# Patient Record
Sex: Male | Born: 1935
Health system: Southern US, Community
[De-identification: ages and names within clinical notes are randomized; demographics above are authoritative.]

## PROBLEM LIST (undated history)

## (undated) DIAGNOSIS — I4510 Unspecified right bundle-branch block: Secondary | ICD-10-CM

## (undated) DIAGNOSIS — K219 Gastro-esophageal reflux disease without esophagitis: Secondary | ICD-10-CM

## (undated) DIAGNOSIS — I4891 Unspecified atrial fibrillation: Secondary | ICD-10-CM

## (undated) DIAGNOSIS — I2510 Atherosclerotic heart disease of native coronary artery without angina pectoris: Secondary | ICD-10-CM

## (undated) DIAGNOSIS — I10 Essential (primary) hypertension: Secondary | ICD-10-CM

## (undated) DIAGNOSIS — M109 Gout, unspecified: Secondary | ICD-10-CM

## (undated) DIAGNOSIS — M159 Polyosteoarthritis, unspecified: Secondary | ICD-10-CM

## (undated) DIAGNOSIS — Z8546 Personal history of malignant neoplasm of prostate: Secondary | ICD-10-CM

## (undated) DIAGNOSIS — G309 Alzheimer's disease, unspecified: Secondary | ICD-10-CM

## (undated) DIAGNOSIS — F0280 Dementia in other diseases classified elsewhere without behavioral disturbance: Secondary | ICD-10-CM

## (undated) HISTORY — DX: Unspecified right bundle-branch block: I45.10

## (undated) HISTORY — DX: Gastro-esophageal reflux disease without esophagitis: K21.9

## (undated) HISTORY — PX: ABDOMINAL ADHESION SURGERY: SHX90

## (undated) HISTORY — DX: Personal history of malignant neoplasm of prostate: Z85.46

## (undated) HISTORY — PX: KNEE SURGERY: SHX244

## (undated) HISTORY — DX: Dementia in other diseases classified elsewhere, unspecified severity, without behavioral disturbance, psychotic disturbance, mood disturbance, and anxiety: F02.80

## (undated) HISTORY — PX: APPENDECTOMY: SHX54

## (undated) HISTORY — DX: Atherosclerotic heart disease of native coronary artery without angina pectoris: I25.10

## (undated) HISTORY — DX: Polyosteoarthritis, unspecified: M15.9

## (undated) HISTORY — PX: INGUINAL HERNIA REPAIR: SUR1180

## (undated) HISTORY — PX: EP IMPLANTABLE DEVICE: SHX172B

## (undated) HISTORY — PX: SHOULDER ARTHROSCOPY: SHX128

## (undated) HISTORY — DX: Gout, unspecified: M10.9

## (undated) HISTORY — PX: TREATMENT FISTULA ANAL: SUR1390

## (undated) HISTORY — DX: Alzheimer's disease, unspecified: G30.9

## (undated) HISTORY — DX: Unspecified atrial fibrillation: I48.91

## (undated) HISTORY — PX: PROSTATECTOMY: SHX69

---

## 2012-12-15 DIAGNOSIS — I10 Essential (primary) hypertension: Secondary | ICD-10-CM | POA: Insufficient documentation

## 2012-12-15 DIAGNOSIS — I4510 Unspecified right bundle-branch block: Secondary | ICD-10-CM | POA: Insufficient documentation

## 2012-12-15 DIAGNOSIS — I4891 Unspecified atrial fibrillation: Secondary | ICD-10-CM | POA: Insufficient documentation

## 2013-01-22 LAB — PROTIME-INR: INR: 1.1 (ref 0.9–1.1)

## 2015-06-26 ENCOUNTER — Encounter: Payer: Self-pay | Admitting: Emergency Medicine

## 2015-06-26 ENCOUNTER — Emergency Department: Payer: Medicare Other

## 2015-06-26 ENCOUNTER — Emergency Department
Admission: EM | Admit: 2015-06-26 | Discharge: 2015-06-26 | Disposition: A | Discharge status: Home / Self Care | Payer: Medicare Other | Attending: Emergency Medicine | Admitting: Emergency Medicine

## 2015-06-26 DIAGNOSIS — M62830 Muscle spasm of back: Secondary | ICD-10-CM | POA: Insufficient documentation

## 2015-06-26 DIAGNOSIS — M5441 Lumbago with sciatica, right side: Secondary | ICD-10-CM | POA: Diagnosis not present

## 2015-06-26 DIAGNOSIS — Z79899 Other long term (current) drug therapy: Secondary | ICD-10-CM | POA: Insufficient documentation

## 2015-06-26 DIAGNOSIS — I10 Essential (primary) hypertension: Secondary | ICD-10-CM | POA: Diagnosis not present

## 2015-06-26 DIAGNOSIS — Z7982 Long term (current) use of aspirin: Secondary | ICD-10-CM | POA: Insufficient documentation

## 2015-06-26 DIAGNOSIS — M5136 Other intervertebral disc degeneration, lumbar region: Secondary | ICD-10-CM | POA: Diagnosis not present

## 2015-06-26 DIAGNOSIS — M5442 Lumbago with sciatica, left side: Secondary | ICD-10-CM | POA: Insufficient documentation

## 2015-06-26 DIAGNOSIS — M545 Low back pain: Secondary | ICD-10-CM | POA: Diagnosis present

## 2015-06-26 HISTORY — DX: Essential (primary) hypertension: I10

## 2015-06-26 MED ORDER — MELOXICAM 15 MG PO TABS: 15.0000 mg | ORAL_TABLET | Freq: Every day | ORAL | Status: DC

## 2015-06-26 MED ORDER — METHOCARBAMOL 500 MG PO TABS: 500.0000 mg | ORAL_TABLET | Freq: Four times a day (QID) | ORAL | Status: DC

## 2015-06-26 MED ORDER — OXYCODONE-ACETAMINOPHEN 10-325 MG PO TABS: 1.0000 | ORAL_TABLET | Freq: Four times a day (QID) | ORAL | Status: DC | PRN

## 2015-06-26 NOTE — ED Notes (Addendum)
Reports when breathing hard pain increases. Difficulty getting out of bed. TENS unit and heating pad applied.  Tramadol 50mg  taken around 10am.  Also used pain patch-Lidoderm patch on left upper back. Pain also in upper back. No changes in urination and having normal BMs. C/o tingling in bilateral hands. Denies numbness to LE.

## 2015-06-26 NOTE — ED Provider Notes (Signed)
[facility] Emergency Department Provider Note  ____________________________________________  Time seen: Approximately 12:21 PM  I have reviewed the triage vital signs and the nursing notes.   HISTORY  Chief Complaint Back Pain and Numbness    HPI Austin [name] is a 81 y.o. male who presents to emergency department complaining of upper and lower back pain. He states that symptoms began last night. He describes the pain as a spasming sensation. He does endorse some intermittent numbness and tingling in extremities. He denies any bowel or bladder dysfunction. Denies any recent history of injury. Patient has tried a TENS unit, heating pad, tramadol, and Lidoderm patch for symptom relief. He denies any improvement on those medications.   Past Medical History  Diagnosis Date  . MI (myocardial infarction) ([place])   . Neck pain   . Back pain   . HTN (hypertension)   . Prostate cancer ([place])   . Appendicitis     There are no active problems to display for this patient.   Past Surgical History  Procedure Laterality Date  . Appendectomy    . Abdominal adhesion surgery    . Shoulder arthroscopy    . Prostatectomy      Current Outpatient Rx  Name  Route  Sig  Dispense  Refill  . aspirin 81 MG tablet   Oral   Take 81 mg by mouth daily.         . Cholecalciferol (VITAMIN D) 2000 UNITS tablet   Oral   Take 2,000 Units by mouth daily.         . clorazepate (TRANXENE) 3.75 MG tablet   Oral   Take 3.75 mg by mouth 2 (two) times daily as needed for anxiety.         Marland Kitchen telmisartan (MICARDIS) 40 MG tablet   Oral   Take 40 mg by mouth 2 (two) times daily at 10 AM and 5 PM.         . meloxicam (MOBIC) 15 MG tablet   Oral   Take 1 tablet (15 mg total) by mouth daily.   30 tablet   0   . methocarbamol (ROBAXIN) 500 MG tablet   Oral   Take 1 tablet (500 mg total) by mouth 4 (four) times daily.   16 tablet   0   . oxyCODONE-acetaminophen  (PERCOCET) 10-325 MG tablet   Oral   Take 1 tablet by mouth every 6 (six) hours as needed for pain.   20 tablet   0     Allergies Review of patient's allergies indicates no known allergies.  History reviewed. No pertinent family history.  Social History Social History  Substance Use Topics  . Smoking status: Never Smoker   . Smokeless tobacco: Never Used  . Alcohol Use: Yes     Comment: 1 martini/ day    Review of Systems Constitutional: No fever/chills Eyes: No visual changes. ENT: No sore throat. Cardiovascular: Denies chest pain. Respiratory: Denies shortness of breath. Gastrointestinal: No abdominal pain.  No nausea, no vomiting.  No diarrhea.  No constipation. Genitourinary: Negative for dysuria. Musculoskeletal: Endorses upper and lower back pain. Skin: Negative for rash. Neurological: Negative for headaches, focal weakness or numbness.  10-point ROS otherwise negative.  ____________________________________________   PHYSICAL EXAM:  VITAL SIGNS: ED Triage Vitals  Enc Vitals Group     BP 06/26/15 1159 160/87 mmHg     Pulse Rate 06/26/15 1159 59     Resp 06/26/15 1159 18  Temp 06/26/15 1159 97.9 F (36.6 C)     Temp src --      SpO2 06/26/15 1159 100 %     Weight 06/26/15 1159 144 lb (65.318 kg)     Height 06/26/15 1159 5\' 8"  (1.727 m)     Head Cir --      Peak Flow --      Pain Score 06/26/15 1201 9     Pain Loc --      Pain Edu? --      Excl. in [place]? --     Constitutional: Alert and oriented. Well appearing and in no acute distress. Eyes: Conjunctivae are normal. PERRL. EOMI. Head: Atraumatic. Nose: No congestion/rhinnorhea. Mouth/Throat: Mucous membranes are moist.  Oropharynx non-erythematous. Neck: No stridor.  No cervical spine tenderness to palpation. Cardiovascular: Normal rate, regular rhythm. Grossly normal heart sounds.  Good peripheral circulation. Respiratory: Normal respiratory effort.  No retractions. Lungs  CTAB. Gastrointestinal: Soft and nontender. No distention. No abdominal bruits. No CVA tenderness. No palpable masses. Musculoskeletal: No lower extremity tenderness nor edema.  No joint effusions. No deformity to back. Inspection. Patient is tender to palpation midline spinal processes in the lumbar region. No palpable abnormality. Patient is also diffusely tender to palpation in the lower cervical paraspinal muscle groups bilaterally. Patient also tender to palpation bilateral paraspinal muscle groups in the lumbar region. Pulses and sensation are intact all 4 extremities. Neurologic:  Normal speech and language. No gross focal neurologic deficits are appreciated. No gait instability. Cranial nerves II through XII are grossly intact. Skin:  Skin is warm, dry and intact. No rash noted. Psychiatric: Mood and affect are normal. Speech and behavior are normal.  ____________________________________________   LABS (all labs ordered are listed, but only abnormal results are displayed)  Labs Reviewed - No data to display ____________________________________________  EKG   ____________________________________________  RADIOLOGY  Lumbar spine x-ray Impression: No acute fracture or dislocation. Severe degenerative joint changes of lumbar spine.   The images were personally reviewed by myself. ____________________________________________   PROCEDURES  Procedure(s) performed: None  Critical Care performed: No  ____________________________________________   INITIAL IMPRESSION / ASSESSMENT AND PLAN / ED COURSE  Pertinent labs & imaging results that were available during my care of the patient were reviewed by me and considered in my medical decision making (see chart for details).  Patient's diagnosis is consistent with degenerative disc disease of lumbar spine with lumbago and intermittent sciatica. Advised patient of findings and diagnosis and he verbalizes understanding of same.  Patient was placed on anti-inflammatories, muscle relaxers, and pain medication for symptom control. Patient is advised of findings and diagnosis and he verbalizes understanding of same.    New Prescriptions   MELOXICAM (MOBIC) 15 MG TABLET    Take 1 tablet (15 mg total) by mouth daily.   METHOCARBAMOL (ROBAXIN) 500 MG TABLET    Take 1 tablet (500 mg total) by mouth 4 (four) times daily.   OXYCODONE-ACETAMINOPHEN (PERCOCET) 10-325 MG TABLET    Take 1 tablet by mouth every 6 (six) hours as needed for pain.    ____________________________________________   FINAL CLINICAL IMPRESSION(S) / ED DIAGNOSES  Final diagnoses:  Degenerative disc disease, lumbar  Lumbar paraspinal muscle spasm  Acute midline low back pain with bilateral sciatica      Darletta Moll, PA-C 06/26/15 1326  Lavonia Drafts, MD 06/26/15 1409

## 2015-06-26 NOTE — Discharge Instructions (Signed)
Degenerative Disk Disease  Degenerative disk disease is a condition caused by the changes that occur in spinal disks as you grow older. Spinal disks are soft and compressible disks located between the bones of your spine (vertebrae). These disks act like shock absorbers. Degenerative disk disease can affect the whole spine. However, the neck and lower back are most commonly affected. Many changes can occur in the spinal disks with aging, such as:  · The spinal disks may dry and shrink.  · Small tears may occur in the tough, outer covering of the disk (annulus).  · The disk space may become smaller due to loss of water.  · Abnormal growths in the bone (spurs) may occur. This can put pressure on the nerve roots exiting the spinal canal, causing pain.  · The spinal canal may become narrowed.  RISK FACTORS   · Being overweight.  · Having a family history of degenerative disk disease.  · Smoking.  · There is increased risk if you are doing heavy lifting or have a sudden injury.  SIGNS AND SYMPTOMS   Symptoms vary from person to person and may include:  · Pain that varies in intensity. Some people have no pain, while others have severe pain. The location of the pain depends on the part of your backbone that is affected.  ¨ You will have neck or arm pain if a disk in the neck area is affected.  ¨ You will have pain in your back, buttocks, or legs if a disk in the lower back is affected.  · Pain that becomes worse while bending, reaching up, or with twisting movements.  · Pain that may start gradually and then get worse as time passes. It may also start after a major or minor injury.  · Numbness or tingling in the arms or legs.  DIAGNOSIS   Your health care provider will ask you about your symptoms and about activities or habits that may cause the pain. He or she may also ask about any injuries, diseases, or treatments you have had. Your health care provider will examine you to check for the range of movement that is  possible in the affected area, to check for strength in your extremities, and to check for sensation in the areas of the arms and legs supplied by different nerve roots. You may also have:   · An X-ray of the spine.  · Other imaging tests, such as MRI.  TREATMENT   Your health care provider will advise you on the best plan for treatment. Treatment may include:  · Medicines.  · Rehabilitation exercises.  HOME CARE INSTRUCTIONS   · Follow proper lifting and walking techniques as advised by your health care provider.  · Maintain good posture.  · Exercise regularly as advised by your health care provider.  · Perform relaxation exercises.  · Change your sitting, standing, and sleeping habits as advised by your health care provider.  · Change positions frequently.  · Lose weight or maintain a healthy weight as advised by your health care provider.  · Do not use any tobacco products, including cigarettes, chewing tobacco, or electronic cigarettes. If you need help quitting, ask your health care provider.  · Wear supportive footwear.  · Take medicines only as directed by your health care provider.  SEEK MEDICAL CARE IF:   · Your pain does not go away within 1-4 weeks.  · You have significant appetite or weight loss.  SEEK IMMEDIATE MEDICAL CARE IF:   ·   instructions.  Will watch your condition.  Will get help right away if you are not doing well or get worse.   This information is not intended to replace advice given to you by your health care provider. Make sure you discuss any questions you have with your health care provider.   Document Released: 04/15/2007 Document Revised: 07/09/2014 Document Reviewed: 10/20/2013 Elsevier Interactive Patient Education 2016 Elsevier Inc[employer]  Radicular Pain Radicular  pain in either the arm or leg is usually from a bulging or herniated disk in the spine. A piece of the herniated disk may press against the nerves as the nerves exit the spine. This causes pain which is felt at the tips of the nerves down the arm or leg. Other causes of radicular pain may include:  Fractures.  Heart disease.  Cancer.  An abnormal and usually degenerative state of the nervous system or nerves (neuropathy). Diagnosis may require CT or MRI scanning to determine the primary cause.  Nerves that start at the neck (nerve roots) may cause radicular pain in the outer shoulder and arm. It can spread down to the thumb and fingers. The symptoms vary depending on which nerve root has been affected. In most cases radicular pain improves with conservative treatment. Neck problems may require physical therapy, a neck collar, or cervical traction. Treatment may take many weeks, and surgery may be considered if the symptoms do not improve.  Conservative treatment is also recommended for sciatica. Sciatica causes pain to radiate from the lower back or buttock area down the leg into the foot. Often there is a history of back problems. Most patients with sciatica are better after 2 to 4 weeks of rest and other supportive care. Short term bed rest can reduce the disk pressure considerably. Sitting, however, is not a good position since this increases the pressure on the disk. You should avoid bending, lifting, and all other activities which make the problem worse. Traction can be used in severe cases. Surgery is usually reserved for patients who do not improve within the first months of treatment. Only take over-the-counter or prescription medicines for pain, discomfort, or fever as directed by your caregiver. Narcotics and muscle relaxants may help by relieving more severe pain and spasm and by providing mild sedation. Cold or massage can give significant relief. Spinal manipulation is not recommended. It  can increase the degree of disc protrusion. Epidural steroid injections are often effective treatment for radicular pain. These injections deliver medicine to the spinal nerve in the space between the protective covering of the spinal cord and back bones (vertebrae). Your caregiver can give you more information about steroid injections. These injections are most effective when given within two weeks of the onset of pain.  You should see your caregiver for follow up care as recommended. A program for neck and back injury rehabilitation with stretching and strengthening exercises is an important part of management.  SEEK IMMEDIATE MEDICAL CARE IF:  You develop increased pain, weakness, or numbness in your arm or leg.  You develop difficulty with bladder or bowel control.  You develop abdominal pain.   This information is not intended to replace advice given to you by your health care provider. Make sure you discuss any questions you have with your health care provider.   Document Released: 07/26/2004 Document Revised: 07/09/2014 Document Reviewed: 01/12/2015 Elsevier Interactive Patient Education [employer].

## 2015-06-26 NOTE — ED Notes (Addendum)
Pt c/o lower back pain that started yesterday. Pt states that he has used a TENS unit, pain patches, and a tramadol with no help for the pain. Pt presents alert and oriented at this time. Pt states he is unable to walk due to the pain. Pt also c/o R hand numbness, states that has resolved at this time with the exception of 1 finger on his R hand. Pt is neurologically intact at this time. Pt denies hx of kidney stones at this time. Neuro exam intact at this time, bilateral equal grip strength, no drift noted at this time, face is symmetrical.

## 2015-07-08 DIAGNOSIS — M47812 Spondylosis without myelopathy or radiculopathy, cervical region: Secondary | ICD-10-CM | POA: Diagnosis not present

## 2015-07-15 DIAGNOSIS — M5412 Radiculopathy, cervical region: Secondary | ICD-10-CM | POA: Diagnosis not present

## 2015-07-15 DIAGNOSIS — M5416 Radiculopathy, lumbar region: Secondary | ICD-10-CM | POA: Diagnosis not present

## 2015-07-15 DIAGNOSIS — I10 Essential (primary) hypertension: Secondary | ICD-10-CM | POA: Diagnosis not present

## 2015-08-04 DIAGNOSIS — R0789 Other chest pain: Secondary | ICD-10-CM | POA: Diagnosis not present

## 2015-08-04 DIAGNOSIS — I4510 Unspecified right bundle-branch block: Secondary | ICD-10-CM | POA: Diagnosis not present

## 2015-08-04 DIAGNOSIS — R002 Palpitations: Secondary | ICD-10-CM | POA: Diagnosis not present

## 2015-08-04 DIAGNOSIS — F0390 Unspecified dementia without behavioral disturbance: Secondary | ICD-10-CM | POA: Diagnosis not present

## 2015-08-04 DIAGNOSIS — I712 Thoracic aortic aneurysm, without rupture: Secondary | ICD-10-CM | POA: Diagnosis not present

## 2015-08-04 DIAGNOSIS — R079 Chest pain, unspecified: Secondary | ICD-10-CM | POA: Diagnosis not present

## 2015-08-04 DIAGNOSIS — R0781 Pleurodynia: Secondary | ICD-10-CM | POA: Diagnosis not present

## 2015-08-04 DIAGNOSIS — I252 Old myocardial infarction: Secondary | ICD-10-CM | POA: Diagnosis not present

## 2015-08-04 DIAGNOSIS — K219 Gastro-esophageal reflux disease without esophagitis: Secondary | ICD-10-CM | POA: Diagnosis not present

## 2015-08-04 DIAGNOSIS — I10 Essential (primary) hypertension: Secondary | ICD-10-CM | POA: Diagnosis not present

## 2015-08-04 DIAGNOSIS — J9811 Atelectasis: Secondary | ICD-10-CM | POA: Diagnosis not present

## 2015-08-04 DIAGNOSIS — R5381 Other malaise: Secondary | ICD-10-CM | POA: Diagnosis not present

## 2015-08-04 DIAGNOSIS — R5383 Other fatigue: Secondary | ICD-10-CM | POA: Diagnosis not present

## 2015-08-05 DIAGNOSIS — K219 Gastro-esophageal reflux disease without esophagitis: Secondary | ICD-10-CM | POA: Diagnosis not present

## 2015-08-05 DIAGNOSIS — I10 Essential (primary) hypertension: Secondary | ICD-10-CM | POA: Diagnosis not present

## 2015-08-05 DIAGNOSIS — R0789 Other chest pain: Secondary | ICD-10-CM | POA: Insufficient documentation

## 2015-08-05 DIAGNOSIS — R079 Chest pain, unspecified: Secondary | ICD-10-CM | POA: Diagnosis not present

## 2015-08-05 DIAGNOSIS — I480 Paroxysmal atrial fibrillation: Secondary | ICD-10-CM | POA: Diagnosis not present

## 2015-08-05 DIAGNOSIS — I495 Sick sinus syndrome: Secondary | ICD-10-CM | POA: Diagnosis not present

## 2015-08-05 DIAGNOSIS — R0781 Pleurodynia: Secondary | ICD-10-CM | POA: Diagnosis not present

## 2015-08-12 DIAGNOSIS — R4181 Age-related cognitive decline: Secondary | ICD-10-CM | POA: Diagnosis not present

## 2015-08-12 DIAGNOSIS — F0390 Unspecified dementia without behavioral disturbance: Secondary | ICD-10-CM | POA: Diagnosis not present

## 2015-08-12 DIAGNOSIS — I4891 Unspecified atrial fibrillation: Secondary | ICD-10-CM | POA: Diagnosis not present

## 2015-08-12 DIAGNOSIS — R0789 Other chest pain: Secondary | ICD-10-CM | POA: Diagnosis not present

## 2015-08-18 DIAGNOSIS — Z7982 Long term (current) use of aspirin: Secondary | ICD-10-CM | POA: Diagnosis not present

## 2015-08-18 DIAGNOSIS — I4891 Unspecified atrial fibrillation: Secondary | ICD-10-CM | POA: Diagnosis not present

## 2015-08-18 DIAGNOSIS — R0789 Other chest pain: Secondary | ICD-10-CM | POA: Diagnosis not present

## 2015-08-18 DIAGNOSIS — R05 Cough: Secondary | ICD-10-CM | POA: Diagnosis not present

## 2015-08-18 DIAGNOSIS — I480 Paroxysmal atrial fibrillation: Secondary | ICD-10-CM | POA: Diagnosis present

## 2015-08-18 DIAGNOSIS — J40 Bronchitis, not specified as acute or chronic: Secondary | ICD-10-CM | POA: Diagnosis present

## 2015-08-18 DIAGNOSIS — K219 Gastro-esophageal reflux disease without esophagitis: Secondary | ICD-10-CM | POA: Diagnosis not present

## 2015-08-18 DIAGNOSIS — R079 Chest pain, unspecified: Secondary | ICD-10-CM | POA: Diagnosis not present

## 2015-08-18 DIAGNOSIS — I495 Sick sinus syndrome: Secondary | ICD-10-CM | POA: Diagnosis not present

## 2015-08-18 DIAGNOSIS — I4510 Unspecified right bundle-branch block: Secondary | ICD-10-CM | POA: Diagnosis present

## 2015-08-18 DIAGNOSIS — M1990 Unspecified osteoarthritis, unspecified site: Secondary | ICD-10-CM | POA: Diagnosis present

## 2015-08-18 DIAGNOSIS — Z8546 Personal history of malignant neoplasm of prostate: Secondary | ICD-10-CM | POA: Diagnosis not present

## 2015-08-18 DIAGNOSIS — I10 Essential (primary) hypertension: Secondary | ICD-10-CM | POA: Diagnosis present

## 2015-08-18 DIAGNOSIS — F0390 Unspecified dementia without behavioral disturbance: Secondary | ICD-10-CM | POA: Diagnosis not present

## 2015-08-19 DIAGNOSIS — I480 Paroxysmal atrial fibrillation: Secondary | ICD-10-CM | POA: Diagnosis not present

## 2015-08-29 DIAGNOSIS — F0390 Unspecified dementia without behavioral disturbance: Secondary | ICD-10-CM | POA: Diagnosis not present

## 2015-08-29 DIAGNOSIS — Z95818 Presence of other cardiac implants and grafts: Secondary | ICD-10-CM | POA: Diagnosis not present

## 2015-08-29 DIAGNOSIS — I4510 Unspecified right bundle-branch block: Secondary | ICD-10-CM | POA: Diagnosis not present

## 2015-08-29 DIAGNOSIS — I480 Paroxysmal atrial fibrillation: Secondary | ICD-10-CM | POA: Diagnosis not present

## 2015-08-29 DIAGNOSIS — I10 Essential (primary) hypertension: Secondary | ICD-10-CM | POA: Diagnosis not present

## 2015-09-02 DIAGNOSIS — Z8673 Personal history of transient ischemic attack (TIA), and cerebral infarction without residual deficits: Secondary | ICD-10-CM | POA: Diagnosis not present

## 2015-09-02 DIAGNOSIS — R4189 Other symptoms and signs involving cognitive functions and awareness: Secondary | ICD-10-CM | POA: Diagnosis not present

## 2015-09-02 DIAGNOSIS — M542 Cervicalgia: Secondary | ICD-10-CM | POA: Diagnosis not present

## 2015-09-12 DIAGNOSIS — Z79899 Other long term (current) drug therapy: Secondary | ICD-10-CM | POA: Diagnosis not present

## 2015-09-16 DIAGNOSIS — G253 Myoclonus: Secondary | ICD-10-CM | POA: Diagnosis not present

## 2015-09-16 DIAGNOSIS — F0390 Unspecified dementia without behavioral disturbance: Secondary | ICD-10-CM | POA: Diagnosis not present

## 2015-09-16 DIAGNOSIS — I480 Paroxysmal atrial fibrillation: Secondary | ICD-10-CM | POA: Diagnosis not present

## 2015-09-16 DIAGNOSIS — I4891 Unspecified atrial fibrillation: Secondary | ICD-10-CM | POA: Diagnosis not present

## 2015-09-16 DIAGNOSIS — M6281 Muscle weakness (generalized): Secondary | ICD-10-CM | POA: Diagnosis not present

## 2015-09-16 DIAGNOSIS — I10 Essential (primary) hypertension: Secondary | ICD-10-CM | POA: Diagnosis not present

## 2015-09-16 DIAGNOSIS — R531 Weakness: Secondary | ICD-10-CM | POA: Diagnosis not present

## 2015-09-16 DIAGNOSIS — S79912A Unspecified injury of left hip, initial encounter: Secondary | ICD-10-CM | POA: Diagnosis not present

## 2015-09-16 DIAGNOSIS — M25562 Pain in left knee: Secondary | ICD-10-CM | POA: Diagnosis not present

## 2015-09-16 DIAGNOSIS — R2681 Unsteadiness on feet: Secondary | ICD-10-CM | POA: Diagnosis not present

## 2015-09-16 DIAGNOSIS — M79606 Pain in leg, unspecified: Secondary | ICD-10-CM | POA: Diagnosis not present

## 2015-09-16 DIAGNOSIS — R4182 Altered mental status, unspecified: Secondary | ICD-10-CM | POA: Diagnosis not present

## 2015-09-16 DIAGNOSIS — R410 Disorientation, unspecified: Secondary | ICD-10-CM | POA: Diagnosis not present

## 2015-09-16 DIAGNOSIS — Z7982 Long term (current) use of aspirin: Secondary | ICD-10-CM | POA: Diagnosis not present

## 2015-09-16 DIAGNOSIS — R0602 Shortness of breath: Secondary | ICD-10-CM | POA: Diagnosis not present

## 2015-09-17 DIAGNOSIS — M6281 Muscle weakness (generalized): Secondary | ICD-10-CM | POA: Diagnosis not present

## 2015-09-17 DIAGNOSIS — I480 Paroxysmal atrial fibrillation: Secondary | ICD-10-CM | POA: Diagnosis not present

## 2015-09-17 DIAGNOSIS — R410 Disorientation, unspecified: Secondary | ICD-10-CM | POA: Diagnosis not present

## 2015-09-17 DIAGNOSIS — F0390 Unspecified dementia without behavioral disturbance: Secondary | ICD-10-CM | POA: Diagnosis not present

## 2015-09-17 DIAGNOSIS — I6781 Acute cerebrovascular insufficiency: Secondary | ICD-10-CM | POA: Diagnosis not present

## 2015-09-18 DIAGNOSIS — M6281 Muscle weakness (generalized): Secondary | ICD-10-CM | POA: Diagnosis not present

## 2015-09-18 DIAGNOSIS — F0390 Unspecified dementia without behavioral disturbance: Secondary | ICD-10-CM | POA: Diagnosis not present

## 2015-09-18 DIAGNOSIS — R0602 Shortness of breath: Secondary | ICD-10-CM | POA: Diagnosis not present

## 2015-09-18 DIAGNOSIS — I480 Paroxysmal atrial fibrillation: Secondary | ICD-10-CM | POA: Diagnosis not present

## 2015-09-19 DIAGNOSIS — G253 Myoclonus: Secondary | ICD-10-CM | POA: Diagnosis not present

## 2015-09-19 DIAGNOSIS — F0390 Unspecified dementia without behavioral disturbance: Secondary | ICD-10-CM | POA: Diagnosis not present

## 2015-09-19 DIAGNOSIS — M6281 Muscle weakness (generalized): Secondary | ICD-10-CM | POA: Diagnosis not present

## 2015-09-19 DIAGNOSIS — I480 Paroxysmal atrial fibrillation: Secondary | ICD-10-CM | POA: Diagnosis not present

## 2015-09-20 DIAGNOSIS — M6281 Muscle weakness (generalized): Secondary | ICD-10-CM | POA: Diagnosis not present

## 2015-09-20 DIAGNOSIS — G253 Myoclonus: Secondary | ICD-10-CM | POA: Diagnosis not present

## 2015-09-21 DIAGNOSIS — F0390 Unspecified dementia without behavioral disturbance: Secondary | ICD-10-CM | POA: Diagnosis not present

## 2015-09-24 DIAGNOSIS — F0390 Unspecified dementia without behavioral disturbance: Secondary | ICD-10-CM | POA: Diagnosis not present

## 2015-09-24 DIAGNOSIS — I10 Essential (primary) hypertension: Secondary | ICD-10-CM | POA: Diagnosis not present

## 2015-09-24 DIAGNOSIS — G253 Myoclonus: Secondary | ICD-10-CM | POA: Diagnosis not present

## 2015-09-24 DIAGNOSIS — I480 Paroxysmal atrial fibrillation: Secondary | ICD-10-CM | POA: Diagnosis not present

## 2015-09-24 DIAGNOSIS — M6281 Muscle weakness (generalized): Secondary | ICD-10-CM | POA: Diagnosis not present

## 2015-09-24 DIAGNOSIS — R296 Repeated falls: Secondary | ICD-10-CM | POA: Diagnosis not present

## 2015-09-27 DIAGNOSIS — R4181 Age-related cognitive decline: Secondary | ICD-10-CM | POA: Diagnosis not present

## 2015-09-27 DIAGNOSIS — R296 Repeated falls: Secondary | ICD-10-CM | POA: Diagnosis not present

## 2015-09-28 DIAGNOSIS — I10 Essential (primary) hypertension: Secondary | ICD-10-CM | POA: Diagnosis not present

## 2015-09-28 DIAGNOSIS — F0390 Unspecified dementia without behavioral disturbance: Secondary | ICD-10-CM | POA: Diagnosis not present

## 2015-09-28 DIAGNOSIS — M6281 Muscle weakness (generalized): Secondary | ICD-10-CM | POA: Diagnosis not present

## 2015-09-28 DIAGNOSIS — I480 Paroxysmal atrial fibrillation: Secondary | ICD-10-CM | POA: Diagnosis not present

## 2015-09-28 DIAGNOSIS — G253 Myoclonus: Secondary | ICD-10-CM | POA: Diagnosis not present

## 2015-09-28 DIAGNOSIS — R296 Repeated falls: Secondary | ICD-10-CM | POA: Diagnosis not present

## 2015-09-29 DIAGNOSIS — I10 Essential (primary) hypertension: Secondary | ICD-10-CM | POA: Diagnosis not present

## 2015-09-29 DIAGNOSIS — R296 Repeated falls: Secondary | ICD-10-CM | POA: Diagnosis not present

## 2015-09-29 DIAGNOSIS — I480 Paroxysmal atrial fibrillation: Secondary | ICD-10-CM | POA: Diagnosis not present

## 2015-09-29 DIAGNOSIS — F0390 Unspecified dementia without behavioral disturbance: Secondary | ICD-10-CM | POA: Diagnosis not present

## 2015-09-29 DIAGNOSIS — M6281 Muscle weakness (generalized): Secondary | ICD-10-CM | POA: Diagnosis not present

## 2015-09-29 DIAGNOSIS — G253 Myoclonus: Secondary | ICD-10-CM | POA: Diagnosis not present

## 2015-09-30 DIAGNOSIS — R296 Repeated falls: Secondary | ICD-10-CM | POA: Diagnosis not present

## 2015-09-30 DIAGNOSIS — G253 Myoclonus: Secondary | ICD-10-CM | POA: Diagnosis not present

## 2015-09-30 DIAGNOSIS — I10 Essential (primary) hypertension: Secondary | ICD-10-CM | POA: Diagnosis not present

## 2015-09-30 DIAGNOSIS — I480 Paroxysmal atrial fibrillation: Secondary | ICD-10-CM | POA: Diagnosis not present

## 2015-09-30 DIAGNOSIS — F0390 Unspecified dementia without behavioral disturbance: Secondary | ICD-10-CM | POA: Diagnosis not present

## 2015-09-30 DIAGNOSIS — M6281 Muscle weakness (generalized): Secondary | ICD-10-CM | POA: Diagnosis not present

## 2015-10-01 DIAGNOSIS — F0390 Unspecified dementia without behavioral disturbance: Secondary | ICD-10-CM | POA: Diagnosis not present

## 2015-10-01 DIAGNOSIS — R296 Repeated falls: Secondary | ICD-10-CM | POA: Diagnosis not present

## 2015-10-01 DIAGNOSIS — G253 Myoclonus: Secondary | ICD-10-CM | POA: Diagnosis not present

## 2015-10-01 DIAGNOSIS — M6281 Muscle weakness (generalized): Secondary | ICD-10-CM | POA: Diagnosis not present

## 2015-10-01 DIAGNOSIS — I10 Essential (primary) hypertension: Secondary | ICD-10-CM | POA: Diagnosis not present

## 2015-10-01 DIAGNOSIS — I480 Paroxysmal atrial fibrillation: Secondary | ICD-10-CM | POA: Diagnosis not present

## 2015-10-02 DIAGNOSIS — M6281 Muscle weakness (generalized): Secondary | ICD-10-CM | POA: Diagnosis not present

## 2015-10-02 DIAGNOSIS — I480 Paroxysmal atrial fibrillation: Secondary | ICD-10-CM | POA: Diagnosis not present

## 2015-10-03 DIAGNOSIS — M6281 Muscle weakness (generalized): Secondary | ICD-10-CM | POA: Diagnosis not present

## 2015-10-03 DIAGNOSIS — I10 Essential (primary) hypertension: Secondary | ICD-10-CM | POA: Diagnosis not present

## 2015-10-03 DIAGNOSIS — R296 Repeated falls: Secondary | ICD-10-CM | POA: Diagnosis not present

## 2015-10-03 DIAGNOSIS — I480 Paroxysmal atrial fibrillation: Secondary | ICD-10-CM | POA: Diagnosis not present

## 2015-10-03 DIAGNOSIS — F0390 Unspecified dementia without behavioral disturbance: Secondary | ICD-10-CM | POA: Diagnosis not present

## 2015-10-03 DIAGNOSIS — G253 Myoclonus: Secondary | ICD-10-CM | POA: Diagnosis not present

## 2015-10-04 DIAGNOSIS — I10 Essential (primary) hypertension: Secondary | ICD-10-CM | POA: Diagnosis not present

## 2015-10-04 DIAGNOSIS — F0390 Unspecified dementia without behavioral disturbance: Secondary | ICD-10-CM | POA: Diagnosis not present

## 2015-10-04 DIAGNOSIS — I480 Paroxysmal atrial fibrillation: Secondary | ICD-10-CM | POA: Diagnosis not present

## 2015-10-04 DIAGNOSIS — M6281 Muscle weakness (generalized): Secondary | ICD-10-CM | POA: Diagnosis not present

## 2015-10-04 DIAGNOSIS — G253 Myoclonus: Secondary | ICD-10-CM | POA: Diagnosis not present

## 2015-10-04 DIAGNOSIS — R296 Repeated falls: Secondary | ICD-10-CM | POA: Diagnosis not present

## 2015-10-05 DIAGNOSIS — G253 Myoclonus: Secondary | ICD-10-CM | POA: Diagnosis not present

## 2015-10-05 DIAGNOSIS — M6281 Muscle weakness (generalized): Secondary | ICD-10-CM | POA: Diagnosis not present

## 2015-10-05 DIAGNOSIS — F0390 Unspecified dementia without behavioral disturbance: Secondary | ICD-10-CM | POA: Diagnosis not present

## 2015-10-05 DIAGNOSIS — I10 Essential (primary) hypertension: Secondary | ICD-10-CM | POA: Diagnosis not present

## 2015-10-05 DIAGNOSIS — I480 Paroxysmal atrial fibrillation: Secondary | ICD-10-CM | POA: Diagnosis not present

## 2015-10-05 DIAGNOSIS — R296 Repeated falls: Secondary | ICD-10-CM | POA: Diagnosis not present

## 2015-10-06 DIAGNOSIS — M6281 Muscle weakness (generalized): Secondary | ICD-10-CM | POA: Diagnosis not present

## 2015-10-06 DIAGNOSIS — F0390 Unspecified dementia without behavioral disturbance: Secondary | ICD-10-CM | POA: Diagnosis not present

## 2015-10-06 DIAGNOSIS — I480 Paroxysmal atrial fibrillation: Secondary | ICD-10-CM | POA: Diagnosis not present

## 2015-10-06 DIAGNOSIS — R296 Repeated falls: Secondary | ICD-10-CM | POA: Diagnosis not present

## 2015-10-06 DIAGNOSIS — G253 Myoclonus: Secondary | ICD-10-CM | POA: Diagnosis not present

## 2015-10-06 DIAGNOSIS — I10 Essential (primary) hypertension: Secondary | ICD-10-CM | POA: Diagnosis not present

## 2015-10-07 DIAGNOSIS — G253 Myoclonus: Secondary | ICD-10-CM | POA: Diagnosis not present

## 2015-10-07 DIAGNOSIS — R296 Repeated falls: Secondary | ICD-10-CM | POA: Diagnosis not present

## 2015-10-07 DIAGNOSIS — F0390 Unspecified dementia without behavioral disturbance: Secondary | ICD-10-CM | POA: Diagnosis not present

## 2015-10-07 DIAGNOSIS — I480 Paroxysmal atrial fibrillation: Secondary | ICD-10-CM | POA: Diagnosis not present

## 2015-10-07 DIAGNOSIS — I10 Essential (primary) hypertension: Secondary | ICD-10-CM | POA: Diagnosis not present

## 2015-10-07 DIAGNOSIS — M6281 Muscle weakness (generalized): Secondary | ICD-10-CM | POA: Diagnosis not present

## 2015-10-10 DIAGNOSIS — I10 Essential (primary) hypertension: Secondary | ICD-10-CM | POA: Diagnosis not present

## 2015-10-10 DIAGNOSIS — I480 Paroxysmal atrial fibrillation: Secondary | ICD-10-CM | POA: Diagnosis not present

## 2015-10-10 DIAGNOSIS — M6281 Muscle weakness (generalized): Secondary | ICD-10-CM | POA: Diagnosis not present

## 2015-10-10 DIAGNOSIS — R296 Repeated falls: Secondary | ICD-10-CM | POA: Diagnosis not present

## 2015-10-10 DIAGNOSIS — G253 Myoclonus: Secondary | ICD-10-CM | POA: Diagnosis not present

## 2015-10-10 DIAGNOSIS — F0390 Unspecified dementia without behavioral disturbance: Secondary | ICD-10-CM | POA: Diagnosis not present

## 2015-10-13 DIAGNOSIS — M6281 Muscle weakness (generalized): Secondary | ICD-10-CM | POA: Diagnosis not present

## 2015-10-13 DIAGNOSIS — F0390 Unspecified dementia without behavioral disturbance: Secondary | ICD-10-CM | POA: Diagnosis not present

## 2015-10-13 DIAGNOSIS — I480 Paroxysmal atrial fibrillation: Secondary | ICD-10-CM | POA: Diagnosis not present

## 2015-10-13 DIAGNOSIS — R296 Repeated falls: Secondary | ICD-10-CM | POA: Diagnosis not present

## 2015-10-13 DIAGNOSIS — G253 Myoclonus: Secondary | ICD-10-CM | POA: Diagnosis not present

## 2015-10-13 DIAGNOSIS — I10 Essential (primary) hypertension: Secondary | ICD-10-CM | POA: Diagnosis not present

## 2015-10-17 DIAGNOSIS — I10 Essential (primary) hypertension: Secondary | ICD-10-CM | POA: Diagnosis not present

## 2015-10-17 DIAGNOSIS — I480 Paroxysmal atrial fibrillation: Secondary | ICD-10-CM | POA: Diagnosis not present

## 2015-10-17 DIAGNOSIS — F0390 Unspecified dementia without behavioral disturbance: Secondary | ICD-10-CM | POA: Diagnosis not present

## 2015-10-17 DIAGNOSIS — R296 Repeated falls: Secondary | ICD-10-CM | POA: Diagnosis not present

## 2015-10-17 DIAGNOSIS — G253 Myoclonus: Secondary | ICD-10-CM | POA: Diagnosis not present

## 2015-10-17 DIAGNOSIS — M6281 Muscle weakness (generalized): Secondary | ICD-10-CM | POA: Diagnosis not present

## 2015-10-18 DIAGNOSIS — R296 Repeated falls: Secondary | ICD-10-CM | POA: Diagnosis not present

## 2015-10-18 DIAGNOSIS — F0390 Unspecified dementia without behavioral disturbance: Secondary | ICD-10-CM | POA: Diagnosis not present

## 2015-10-18 DIAGNOSIS — G253 Myoclonus: Secondary | ICD-10-CM | POA: Diagnosis not present

## 2015-10-18 DIAGNOSIS — I10 Essential (primary) hypertension: Secondary | ICD-10-CM | POA: Diagnosis not present

## 2015-10-18 DIAGNOSIS — I480 Paroxysmal atrial fibrillation: Secondary | ICD-10-CM | POA: Diagnosis not present

## 2015-10-18 DIAGNOSIS — M6281 Muscle weakness (generalized): Secondary | ICD-10-CM | POA: Diagnosis not present

## 2015-10-19 DIAGNOSIS — R296 Repeated falls: Secondary | ICD-10-CM | POA: Diagnosis not present

## 2015-10-19 DIAGNOSIS — G253 Myoclonus: Secondary | ICD-10-CM | POA: Diagnosis not present

## 2015-10-19 DIAGNOSIS — M6281 Muscle weakness (generalized): Secondary | ICD-10-CM | POA: Diagnosis not present

## 2015-10-19 DIAGNOSIS — F0390 Unspecified dementia without behavioral disturbance: Secondary | ICD-10-CM | POA: Diagnosis not present

## 2015-10-19 DIAGNOSIS — I10 Essential (primary) hypertension: Secondary | ICD-10-CM | POA: Diagnosis not present

## 2015-10-19 DIAGNOSIS — I480 Paroxysmal atrial fibrillation: Secondary | ICD-10-CM | POA: Diagnosis not present

## 2015-10-20 DIAGNOSIS — F0390 Unspecified dementia without behavioral disturbance: Secondary | ICD-10-CM | POA: Diagnosis not present

## 2015-10-20 DIAGNOSIS — I480 Paroxysmal atrial fibrillation: Secondary | ICD-10-CM | POA: Diagnosis not present

## 2015-10-20 DIAGNOSIS — R296 Repeated falls: Secondary | ICD-10-CM | POA: Diagnosis not present

## 2015-10-20 DIAGNOSIS — G253 Myoclonus: Secondary | ICD-10-CM | POA: Diagnosis not present

## 2015-10-20 DIAGNOSIS — I10 Essential (primary) hypertension: Secondary | ICD-10-CM | POA: Diagnosis not present

## 2015-10-20 DIAGNOSIS — M6281 Muscle weakness (generalized): Secondary | ICD-10-CM | POA: Diagnosis not present

## 2015-10-21 DIAGNOSIS — I10 Essential (primary) hypertension: Secondary | ICD-10-CM | POA: Diagnosis not present

## 2015-10-21 DIAGNOSIS — I4891 Unspecified atrial fibrillation: Secondary | ICD-10-CM | POA: Diagnosis not present

## 2015-10-21 DIAGNOSIS — F0390 Unspecified dementia without behavioral disturbance: Secondary | ICD-10-CM | POA: Diagnosis not present

## 2015-10-24 DIAGNOSIS — M6281 Muscle weakness (generalized): Secondary | ICD-10-CM | POA: Diagnosis not present

## 2015-10-24 DIAGNOSIS — R296 Repeated falls: Secondary | ICD-10-CM | POA: Diagnosis not present

## 2015-10-24 DIAGNOSIS — I10 Essential (primary) hypertension: Secondary | ICD-10-CM | POA: Diagnosis not present

## 2015-10-24 DIAGNOSIS — G253 Myoclonus: Secondary | ICD-10-CM | POA: Diagnosis not present

## 2015-10-24 DIAGNOSIS — I480 Paroxysmal atrial fibrillation: Secondary | ICD-10-CM | POA: Diagnosis not present

## 2015-10-24 DIAGNOSIS — F0390 Unspecified dementia without behavioral disturbance: Secondary | ICD-10-CM | POA: Diagnosis not present

## 2015-10-25 DIAGNOSIS — G253 Myoclonus: Secondary | ICD-10-CM | POA: Diagnosis not present

## 2015-10-25 DIAGNOSIS — F0390 Unspecified dementia without behavioral disturbance: Secondary | ICD-10-CM | POA: Diagnosis not present

## 2015-10-25 DIAGNOSIS — M6281 Muscle weakness (generalized): Secondary | ICD-10-CM | POA: Diagnosis not present

## 2015-10-25 DIAGNOSIS — I480 Paroxysmal atrial fibrillation: Secondary | ICD-10-CM | POA: Diagnosis not present

## 2015-10-25 DIAGNOSIS — I10 Essential (primary) hypertension: Secondary | ICD-10-CM | POA: Diagnosis not present

## 2015-10-25 DIAGNOSIS — R296 Repeated falls: Secondary | ICD-10-CM | POA: Diagnosis not present

## 2015-10-27 DIAGNOSIS — M6281 Muscle weakness (generalized): Secondary | ICD-10-CM | POA: Diagnosis not present

## 2015-10-27 DIAGNOSIS — G253 Myoclonus: Secondary | ICD-10-CM | POA: Diagnosis not present

## 2015-10-27 DIAGNOSIS — I10 Essential (primary) hypertension: Secondary | ICD-10-CM | POA: Diagnosis not present

## 2015-10-27 DIAGNOSIS — R296 Repeated falls: Secondary | ICD-10-CM | POA: Diagnosis not present

## 2015-10-27 DIAGNOSIS — I480 Paroxysmal atrial fibrillation: Secondary | ICD-10-CM | POA: Diagnosis not present

## 2015-10-27 DIAGNOSIS — F0390 Unspecified dementia without behavioral disturbance: Secondary | ICD-10-CM | POA: Diagnosis not present

## 2015-10-28 DIAGNOSIS — M6281 Muscle weakness (generalized): Secondary | ICD-10-CM | POA: Diagnosis not present

## 2015-10-28 DIAGNOSIS — R296 Repeated falls: Secondary | ICD-10-CM | POA: Diagnosis not present

## 2015-10-28 DIAGNOSIS — F0390 Unspecified dementia without behavioral disturbance: Secondary | ICD-10-CM | POA: Diagnosis not present

## 2015-10-28 DIAGNOSIS — I480 Paroxysmal atrial fibrillation: Secondary | ICD-10-CM | POA: Diagnosis not present

## 2015-10-28 DIAGNOSIS — I10 Essential (primary) hypertension: Secondary | ICD-10-CM | POA: Diagnosis not present

## 2015-10-28 DIAGNOSIS — G253 Myoclonus: Secondary | ICD-10-CM | POA: Diagnosis not present

## 2015-11-01 DIAGNOSIS — M6281 Muscle weakness (generalized): Secondary | ICD-10-CM | POA: Diagnosis not present

## 2015-11-01 DIAGNOSIS — R296 Repeated falls: Secondary | ICD-10-CM | POA: Diagnosis not present

## 2015-11-01 DIAGNOSIS — F0390 Unspecified dementia without behavioral disturbance: Secondary | ICD-10-CM | POA: Diagnosis not present

## 2015-11-01 DIAGNOSIS — I480 Paroxysmal atrial fibrillation: Secondary | ICD-10-CM | POA: Diagnosis not present

## 2015-11-01 DIAGNOSIS — I10 Essential (primary) hypertension: Secondary | ICD-10-CM | POA: Diagnosis not present

## 2015-11-01 DIAGNOSIS — G253 Myoclonus: Secondary | ICD-10-CM | POA: Diagnosis not present

## 2015-11-03 DIAGNOSIS — G253 Myoclonus: Secondary | ICD-10-CM | POA: Diagnosis not present

## 2015-11-03 DIAGNOSIS — M6281 Muscle weakness (generalized): Secondary | ICD-10-CM | POA: Diagnosis not present

## 2015-11-03 DIAGNOSIS — F0390 Unspecified dementia without behavioral disturbance: Secondary | ICD-10-CM | POA: Diagnosis not present

## 2015-11-03 DIAGNOSIS — I480 Paroxysmal atrial fibrillation: Secondary | ICD-10-CM | POA: Diagnosis not present

## 2015-11-03 DIAGNOSIS — R296 Repeated falls: Secondary | ICD-10-CM | POA: Diagnosis not present

## 2015-11-03 DIAGNOSIS — I10 Essential (primary) hypertension: Secondary | ICD-10-CM | POA: Diagnosis not present

## 2015-11-04 DIAGNOSIS — F0390 Unspecified dementia without behavioral disturbance: Secondary | ICD-10-CM | POA: Diagnosis not present

## 2015-11-04 DIAGNOSIS — I10 Essential (primary) hypertension: Secondary | ICD-10-CM | POA: Diagnosis not present

## 2015-11-04 DIAGNOSIS — G253 Myoclonus: Secondary | ICD-10-CM | POA: Diagnosis not present

## 2015-11-04 DIAGNOSIS — I480 Paroxysmal atrial fibrillation: Secondary | ICD-10-CM | POA: Diagnosis not present

## 2015-11-04 DIAGNOSIS — M6281 Muscle weakness (generalized): Secondary | ICD-10-CM | POA: Diagnosis not present

## 2015-11-04 DIAGNOSIS — R296 Repeated falls: Secondary | ICD-10-CM | POA: Diagnosis not present

## 2015-11-08 DIAGNOSIS — R296 Repeated falls: Secondary | ICD-10-CM | POA: Diagnosis not present

## 2015-11-08 DIAGNOSIS — G253 Myoclonus: Secondary | ICD-10-CM | POA: Diagnosis not present

## 2015-11-08 DIAGNOSIS — I480 Paroxysmal atrial fibrillation: Secondary | ICD-10-CM | POA: Diagnosis not present

## 2015-11-08 DIAGNOSIS — F0390 Unspecified dementia without behavioral disturbance: Secondary | ICD-10-CM | POA: Diagnosis not present

## 2015-11-08 DIAGNOSIS — M6281 Muscle weakness (generalized): Secondary | ICD-10-CM | POA: Diagnosis not present

## 2015-11-08 DIAGNOSIS — I10 Essential (primary) hypertension: Secondary | ICD-10-CM | POA: Diagnosis not present

## 2015-11-09 DIAGNOSIS — I480 Paroxysmal atrial fibrillation: Secondary | ICD-10-CM | POA: Diagnosis not present

## 2015-11-09 DIAGNOSIS — I10 Essential (primary) hypertension: Secondary | ICD-10-CM | POA: Diagnosis not present

## 2015-11-09 DIAGNOSIS — M6281 Muscle weakness (generalized): Secondary | ICD-10-CM | POA: Diagnosis not present

## 2015-11-09 DIAGNOSIS — F0390 Unspecified dementia without behavioral disturbance: Secondary | ICD-10-CM | POA: Diagnosis not present

## 2015-11-09 DIAGNOSIS — G253 Myoclonus: Secondary | ICD-10-CM | POA: Diagnosis not present

## 2015-11-09 DIAGNOSIS — R296 Repeated falls: Secondary | ICD-10-CM | POA: Diagnosis not present

## 2015-11-10 DIAGNOSIS — F0281 Dementia in other diseases classified elsewhere with behavioral disturbance: Secondary | ICD-10-CM | POA: Diagnosis not present

## 2015-11-10 DIAGNOSIS — G308 Other Alzheimer's disease: Secondary | ICD-10-CM | POA: Diagnosis not present

## 2015-11-11 DIAGNOSIS — F02818 Dementia in other diseases classified elsewhere, unspecified severity, with other behavioral disturbance: Secondary | ICD-10-CM | POA: Insufficient documentation

## 2015-11-11 DIAGNOSIS — G309 Alzheimer's disease, unspecified: Secondary | ICD-10-CM | POA: Insufficient documentation

## 2015-11-19 DIAGNOSIS — R251 Tremor, unspecified: Secondary | ICD-10-CM | POA: Diagnosis not present

## 2015-11-20 ENCOUNTER — Emergency Department: Payer: Medicare Other

## 2015-11-20 ENCOUNTER — Encounter: Payer: Self-pay | Admitting: *Deleted

## 2015-11-20 ENCOUNTER — Emergency Department
Admission: EM | Admit: 2015-11-20 | Discharge: 2015-11-20 | Disposition: A | Discharge status: Home / Self Care | Payer: Medicare Other | Attending: Emergency Medicine | Admitting: Emergency Medicine

## 2015-11-20 DIAGNOSIS — Z7982 Long term (current) use of aspirin: Secondary | ICD-10-CM | POA: Insufficient documentation

## 2015-11-20 DIAGNOSIS — R110 Nausea: Secondary | ICD-10-CM | POA: Diagnosis present

## 2015-11-20 DIAGNOSIS — I10 Essential (primary) hypertension: Secondary | ICD-10-CM | POA: Diagnosis not present

## 2015-11-20 DIAGNOSIS — Z79899 Other long term (current) drug therapy: Secondary | ICD-10-CM | POA: Insufficient documentation

## 2015-11-20 DIAGNOSIS — R42 Dizziness and giddiness: Secondary | ICD-10-CM | POA: Diagnosis not present

## 2015-11-20 DIAGNOSIS — Z8546 Personal history of malignant neoplasm of prostate: Secondary | ICD-10-CM | POA: Diagnosis not present

## 2015-11-20 DIAGNOSIS — I252 Old myocardial infarction: Secondary | ICD-10-CM | POA: Insufficient documentation

## 2015-11-20 LAB — CBC
HEMATOCRIT: 40.1 % (ref 40.0–52.0)
Hemoglobin: 13.7 g/dL (ref 13.0–18.0)
MCH: 31.2 pg (ref 26.0–34.0)
MCHC: 34.1 g/dL (ref 32.0–36.0)
MCV: 91.3 fL (ref 80.0–100.0)
PLATELETS: 140 10*3/uL — AB (ref 150–440)
RBC: 4.39 MIL/uL — ABNORMAL LOW (ref 4.40–5.90)
RDW: 14.4 % (ref 11.5–14.5)
WBC: 7.6 10*3/uL (ref 3.8–10.6)

## 2015-11-20 LAB — COMPREHENSIVE METABOLIC PANEL
ALBUMIN: 4.3 g/dL (ref 3.5–5.0)
ALK PHOS: 61 U/L (ref 38–126)
ALT: 22 U/L (ref 17–63)
AST: 27 U/L (ref 15–41)
Anion gap: 8 (ref 5–15)
BILIRUBIN TOTAL: 0.4 mg/dL (ref 0.3–1.2)
BUN: 20 mg/dL (ref 6–20)
CALCIUM: 9.3 mg/dL (ref 8.9–10.3)
CO2: 28 mmol/L (ref 22–32)
CREATININE: 0.94 mg/dL (ref 0.61–1.24)
Chloride: 102 mmol/L (ref 101–111)
GFR calc Af Amer: >60 mL/min (ref >60)
GLUCOSE: 119 mg/dL — AB (ref 65–99)
Potassium: 3.8 mmol/L (ref 3.5–5.1)
Sodium: 138 mmol/L (ref 135–145)
TOTAL PROTEIN: 6.9 g/dL (ref 6.5–8.1)

## 2015-11-20 LAB — LIPASE, BLOOD: Lipase: 49 U/L (ref 11–51)

## 2015-11-20 LAB — URINALYSIS COMPLETE WITH MICROSCOPIC (ARMC ONLY)
BACTERIA UA: NONE SEEN
Bilirubin Urine: NEGATIVE
Glucose, UA: NEGATIVE mg/dL
HGB URINE DIPSTICK: NEGATIVE
KETONES UR: NEGATIVE mg/dL
Leukocytes, UA: NEGATIVE
NITRITE: NEGATIVE
PH: 5 (ref 5.0–8.0)
PROTEIN: NEGATIVE mg/dL
Specific Gravity, Urine: 1.017 (ref 1.005–1.030)
Squamous Epithelial / LPF: NONE SEEN

## 2015-11-20 MED ORDER — MECLIZINE HCL 25 MG PO TABS: 25.0000 mg | ORAL_TABLET | Freq: Three times a day (TID) | ORAL | Status: DC | PRN

## 2015-11-20 NOTE — ED Notes (Signed)
MD at bedside. 

## 2015-11-20 NOTE — ED Notes (Signed)
EMS pt from home with c/o feeling shaky with nausea two of the last 3 nights; daughter reported to EMS that it looked like seizure activity to her but pt can recall incidents

## 2015-11-20 NOTE — ED Notes (Signed)
Pt alert and oriented X4, active, cooperative, pt in NAD. RR even and unlabored, color WNL.  Pt informed to return if any life threatening symptoms occur.   

## 2015-11-20 NOTE — ED Provider Notes (Signed)
[facility] Emergency Department Provider Note  ____________________________________________  Time seen: Approximately 4:45 AM  I have reviewed the triage vital signs and the nursing notes.   HISTORY  Chief Complaint Nausea and Shaking    HPI Austin [name] is a 82 y.o. male who recently moved to this area to live with his daughter after having a fall at home in [place] and sustaining a head injury as well as some injuries to his extremities.  He presents tonight because when he lied down flattonight he began shaking all over and experiencing dizziness and nausea.  He was alert and oriented throughout the episode.  It is unclear if he had a similar episode in the past, he does not specifically remember and does have some degree of chronic dementia and is currently being evaluated at the memory care unit at [place].  He denies fever/chills, chest pain, shortness of breath, vomiting, diarrhea, dysuria.  His symptoms were reportedly severe and acute in onset but if completely resolved, at least while he is sitting up.   Past Medical History  Diagnosis Date  . MI (myocardial infarction) ([place])   . Neck pain   . Back pain   . HTN (hypertension)   . Prostate cancer ([place])   . Appendicitis     There are no active problems to display for this patient.   Past Surgical History  Procedure Laterality Date  . Appendectomy    . Abdominal adhesion surgery    . Shoulder arthroscopy    . Prostatectomy      Current Outpatient Rx  Name  Route  Sig  Dispense  Refill  . aspirin 81 MG tablet   Oral   Take 81 mg by mouth daily.         . Cholecalciferol (VITAMIN D) 2000 UNITS tablet   Oral   Take 2,000 Units by mouth daily.         . clorazepate (TRANXENE) 3.75 MG tablet   Oral   Take 3.75 mg by mouth 2 (two) times daily as needed for anxiety.         . meclizine (ANTIVERT) 25 MG tablet   Oral   Take 1 tablet (25 mg total) by mouth 3  (three) times daily as needed for dizziness.   30 tablet   0   . meloxicam (MOBIC) 15 MG tablet   Oral   Take 1 tablet (15 mg total) by mouth daily.   30 tablet   0   . methocarbamol (ROBAXIN) 500 MG tablet   Oral   Take 1 tablet (500 mg total) by mouth 4 (four) times daily.   16 tablet   0   . oxyCODONE-acetaminophen (PERCOCET) 10-325 MG tablet   Oral   Take 1 tablet by mouth every 6 (six) hours as needed for pain.   20 tablet   0   . telmisartan (MICARDIS) 40 MG tablet   Oral   Take 40 mg by mouth 2 (two) times daily at 10 AM and 5 PM.           Allergies Review of patient's allergies indicates no known allergies.  History reviewed. No pertinent family history.  Social History Social History  Substance Use Topics  . Smoking status: Never Smoker   . Smokeless tobacco: Never Used  . Alcohol Use: Yes     Comment: 1 martini/ day    Review of Systems Constitutional: No fever/chills Eyes: No visual changes. ENT: No  sore throat. Cardiovascular: Denies chest pain. Respiratory: Denies shortness of breath. Gastrointestinal: No abdominal pain.  No nausea, no vomiting.  No diarrhea.  No constipation. Genitourinary: Negative for dysuria. Musculoskeletal: Negative for back pain. Skin: Negative for rash. Neurological: Generalized shaking episodes with dizziness and nausea when lying flat.  Negative for headaches, focal weakness or numbness.  10-point ROS otherwise negative.  ____________________________________________   PHYSICAL EXAM:  VITAL SIGNS: ED Triage Vitals  Enc Vitals Group     BP 11/20/15 0135 149/80 mmHg     Pulse Rate 11/20/15 0135 82     Resp 11/20/15 0135 18     Temp 11/20/15 0135 97.8 F (36.6 C)     Temp Source 11/20/15 0135 Oral     SpO2 11/20/15 0135 97 %     Weight 11/20/15 0135 145 lb (65.772 kg)     Height 11/20/15 0135 5\' 8"  (1.727 m)     Head Cir --      Peak Flow --      Pain Score --      Pain Loc --      Pain Edu? --       Excl. in [place]? --     Constitutional: Alert and oriented. Well appearing and in no acute distress. Eyes: Conjunctivae are normal. PERRL. EOMI. Head: Atraumatic. Nose: No congestion/rhinnorhea. Mouth/Throat: Mucous membranes are moist.  Oropharynx non-erythematous. Neck: No stridor.  No meningeal signs.  No cervical spine tenderness to palpation. Cardiovascular: Normal rate, regular rhythm. Good peripheral circulation. Grossly normal heart sounds.   Respiratory: Normal respiratory effort.  No retractions. Lungs CTAB. Gastrointestinal: Soft and nontender. No distention.  Musculoskeletal: No lower extremity tenderness nor edema. No gross deformities of extremities. Neurologic:  Normal speech and language. No gross focal neurologic deficits are appreciated.  Skin:  Skin is warm, dry and intact. No rash noted. Psychiatric: Mood and affect are normal. Speech and behavior are normal.  ____________________________________________   LABS (all labs ordered are listed, but only abnormal results are displayed)  Labs Reviewed  COMPREHENSIVE METABOLIC PANEL - Abnormal; Notable for the following:    Glucose, Bld 119 (*)    All other components within normal limits  CBC - Abnormal; Notable for the following:    RBC 4.39 (*)    Platelets 140 (*)    All other components within normal limits  URINALYSIS COMPLETEWITH MICROSCOPIC ([facility] ONLY) - Abnormal; Notable for the following:    Color, Urine YELLOW (*)    APPearance CLEAR (*)    All other components within normal limits  LIPASE, BLOOD   ____________________________________________  EKG  None ____________________________________________  RADIOLOGY   Ct Head Wo Contrast  11/20/2015  CLINICAL DATA:  Dizzy and shaky when lying in bed.  Nausea. EXAM: CT HEAD WITHOUT CONTRAST TECHNIQUE: Contiguous axial images were obtained from the base of the skull through the vertex without intravenous contrast. COMPARISON:  None. FINDINGS: Diffuse cerebral  atrophy. Ventricular dilatation likely due to central atrophy. Patchy low-attenuation changes in the deep white matter consistent with small vessel ischemia. No mass effect or midline shift. No abnormal extra-axial fluid collections. Gray-white matter junctions are distinct. Basal cisterns are not effaced. No evidence of acute intracranial hemorrhage. No depressed skull fractures. Visualized paranasal sinuses and mastoid air cells are not opacified. Vascular calcifications. IMPRESSION: No acute intracranial abnormalities. Chronic atrophy and small vessel ischemic changes. Electronically Signed   By: Lucienne Capers M.D.   On: 11/20/2015 06:28    ____________________________________________   PROCEDURES  Procedure(s) performed: None  Critical Care performed: No ____________________________________________   INITIAL IMPRESSION / ASSESSMENT AND PLAN / ED COURSE  Pertinent labs & imaging results that were available during my care of the patient were reviewed by me and considered in my medical decision making (see chart for details).  Unremarkable workup.  Patient Has had no additional episodes and has been able to lie flat both for the CT scan and simply in the bed afterwards.  I explained to the patient and his daughters that he had a reassuring medical screening exam and I do not have a specific explanation for her symptoms.  I talked to them about vertigo and recommended he follow up as an outpatient.  I gave my usual customary return precautions and they agree with the plan.   ____________________________________________  FINAL CLINICAL IMPRESSION(S) / ED DIAGNOSES  Final diagnoses:  Vertigo     MEDICATIONS GIVEN DURING THIS VISIT:  Medications - No data to display   NEW OUTPATIENT MEDICATIONS STARTED DURING THIS VISIT:  New Prescriptions   MECLIZINE (ANTIVERT) 25 MG TABLET    Take 1 tablet (25 mg total) by mouth 3 (three) times daily as needed for dizziness.      Note:   This document was prepared using Dragon voice recognition software and may include unintentional dictation errors.   Hinda Kehr, MD 11/20/15 [phone]

## 2015-11-20 NOTE — Discharge Instructions (Signed)
As we discussed, your workup today was reassuring.  Though we do not know exactly what is causing your symptoms, it appears that you have no emergent medical condition at this time are safe to go home and follow up as recommended in this paperwork.  Because your symptoms may be the result of vertigo, we provided some information to read as well as a medication that may help, but ONLY use the medication if needed (it is not to be taken on a regular basis).  Please return immediately to the Emergency Department if you develop any new or worsening symptoms that concern you.   Dizziness Dizziness is a common problem. It is a feeling of unsteadiness or light-headedness. You may feel like you are about to faint. Dizziness can lead to injury if you stumble or fall. Anyone can become dizzy, but dizziness is more common in older adults. This condition can be caused by a number of things, including medicines, dehydration, or illness. HOME CARE INSTRUCTIONS Taking these steps may help with your condition: Eating and Drinking  Drink enough fluid to keep your urine clear or pale yellow. This helps to keep you from becoming dehydrated. Try to drink more clear fluids, such as water.  Do not drink alcohol.  Limit your caffeine intake if directed by your health care provider.  Limit your salt intake if directed by your health care provider. Activity  Avoid making quick movements.  Rise slowly from chairs and steady yourself until you feel okay.  In the morning, first sit up on the side of the bed. When you feel okay, stand slowly while you hold onto something until you know that your balance is fine.  Move your legs often if you need to stand in one place for a long time. Tighten and relax your muscles in your legs while you are standing.  Do not drive or operate heavy machinery if you feel dizzy.  Avoid bending down if you feel dizzy. Place items in your home so that they are easy for you to reach  without leaning over. Lifestyle  Do not use any tobacco products, including cigarettes, chewing tobacco, or electronic cigarettes. If you need help quitting, ask your health care provider.  Try to reduce your stress level, such as with yoga or meditation. Talk with your health care provider if you need help. General Instructions  Watch your dizziness for any changes.  Take medicines only as directed by your health care provider. Talk with your health care provider if you think that your dizziness is caused by a medicine that you are taking.  Tell a friend or a family member that you are feeling dizzy. If he or she notices any changes in your behavior, have this person call your health care provider.  Keep all follow-up visits as directed by your health care provider. This is important. SEEK MEDICAL CARE IF:  Your dizziness does not go away.  Your dizziness or light-headedness gets worse.  You feel nauseous.  You have reduced hearing.  You have new symptoms.  You are unsteady on your feet or you feel like the room is spinning. SEEK IMMEDIATE MEDICAL CARE IF:  You vomit or have diarrhea and are unable to eat or drink anything.  You have problems talking, walking, swallowing, or using your arms, hands, or legs.  You feel generally weak.  You are not thinking clearly or you have trouble forming sentences. It may take a friend or family member to notice this.  You have chest pain, abdominal pain, shortness of breath, or sweating.  Your vision changes.  You notice any bleeding.  You have a headache.  You have neck pain or a stiff neck.  You have a fever.   This information is not intended to replace advice given to you by your health care provider. Make sure you discuss any questions you have with your health care provider.   Document Released: 12/12/2000 Document Revised: 11/02/2014 Document Reviewed: 06/14/2014 Elsevier Interactive Patient Education 2016 Elsevier  Inc.  Benign Positional Vertigo Vertigo is the feeling that you or your surroundings are moving when they are not. Benign positional vertigo is the most common form of vertigo. The cause of this condition is not serious (is benign). This condition is triggered by certain movements and positions (is positional). This condition can be dangerous if it occurs while you are doing something that could endanger you or others, such as driving.  CAUSES In many cases, the cause of this condition is not known. It may be caused by a disturbance in an area of the inner ear that helps your brain to sense movement and balance. This disturbance can be caused by a viral infection (labyrinthitis), head injury, or repetitive motion. RISK FACTORS This condition is more likely to develop in:  Women.  People who are 23 years of age or older. SYMPTOMS Symptoms of this condition usually happen when you move your head or your eyes in different directions. Symptoms may start suddenly, and they usually last for less than a minute. Symptoms may include:  Loss of balance and falling.  Feeling like you are spinning or moving.  Feeling like your surroundings are spinning or moving.  Nausea and vomiting.  Blurred vision.  Dizziness.  Involuntary eye movement (nystagmus). Symptoms can be mild and cause only slight annoyance, or they can be severe and interfere with daily life. Episodes of benign positional vertigo may return (recur) over time, and they may be triggered by certain movements. Symptoms may improve over time. DIAGNOSIS This condition is usually diagnosed by medical history and a physical exam of the head, neck, and ears. You may be referred to a health care provider who specializes in ear, nose, and throat (ENT) problems (otolaryngologist) or a provider who specializes in disorders of the nervous system (neurologist). You may have additional testing, including:  MRI.  A CT scan.  Eye movement tests.  Your health care provider may ask you to change positions quickly while he or she watches you for symptoms of benign positional vertigo, such as nystagmus. Eye movement may be tested with an electronystagmogram (ENG), caloric stimulation, the Dix-Hallpike test, or the roll test.  An electroencephalogram (EEG). This records electrical activity in your brain.  Hearing tests. TREATMENT Usually, your health care provider will treat this by moving your head in specific positions to adjust your inner ear back to normal. Surgery may be needed in severe cases, but this is rare. In some cases, benign positional vertigo may resolve on its own in 2-4 weeks. HOME CARE INSTRUCTIONS Safety  Move slowly.Avoid sudden body or head movements.  Avoid driving.  Avoid operating heavy machinery.  Avoid doing any tasks that would be dangerous to you or others if a vertigo episode would occur.  If you have trouble walking or keeping your balance, try using a cane for stability. If you feel dizzy or unstable, sit down right away.  Return to your normal activities as told by your health care provider. Ask  your health care provider what activities are safe for you. General Instructions  Take over-the-counter and prescription medicines only as told by your health care provider.  Avoid certain positions or movements as told by your health care provider.  Drink enough fluid to keep your urine clear or pale yellow.  Keep all follow-up visits as told by your health care provider. This is important. SEEK MEDICAL CARE IF:  You have a fever.  Your condition gets worse or you develop new symptoms.  Your family or friends notice any behavioral changes.  Your nausea or vomiting gets worse.  You have numbness or a "pins and needles" sensation. SEEK IMMEDIATE MEDICAL CARE IF:  You have difficulty speaking or moving.  You are always dizzy.  You faint.  You develop severe headaches.  You have weakness in  your legs or arms.  You have changes in your hearing or vision.  You develop a stiff neck.  You develop sensitivity to light.   This information is not intended to replace advice given to you by your health care provider. Make sure you discuss any questions you have with your health care provider.   Document Released: 03/26/2006 Document Revised: 03/09/2015 Document Reviewed: 10/11/2014 Elsevier Interactive Patient Education [employer].

## 2015-11-20 NOTE — ED Notes (Signed)
Pt states that at 0100 today that he became dizzy and shaky as soon as he lay flat in bed. Pt states he had no problems while he was sitting upright. Pt states he became nauseated at that time. Pt states right now, sitting in triage he feels slightly nauseated and feels pressure in the top of his head. Pt states nausea increases when he is lying flat. Pt states this occurred on Thursday night, not as bad as tonight. Pt denies problems on Friday night. Pt states he believes a year ago he had a similar occurrence, stayed in hospital x 2 days, but does not recall what he was diagnosed with at that time.

## 2015-11-20 NOTE — ED Notes (Signed)
Pt daughter reports that pt has been shaking along with the nausea and is concerned about possible seizures. Pt denies pain or nausea at this time and is sitting quietly.

## 2015-12-02 DIAGNOSIS — Z95818 Presence of other cardiac implants and grafts: Secondary | ICD-10-CM | POA: Diagnosis not present

## 2015-12-02 DIAGNOSIS — M5136 Other intervertebral disc degeneration, lumbar region: Secondary | ICD-10-CM | POA: Diagnosis not present

## 2015-12-02 DIAGNOSIS — R1313 Dysphagia, pharyngeal phase: Secondary | ICD-10-CM | POA: Diagnosis not present

## 2015-12-02 DIAGNOSIS — F0281 Dementia in other diseases classified elsewhere with behavioral disturbance: Secondary | ICD-10-CM | POA: Diagnosis not present

## 2015-12-27 DIAGNOSIS — J383 Other diseases of vocal cords: Secondary | ICD-10-CM | POA: Diagnosis not present

## 2015-12-27 DIAGNOSIS — H6122 Impacted cerumen, left ear: Secondary | ICD-10-CM | POA: Diagnosis not present

## 2015-12-27 DIAGNOSIS — K219 Gastro-esophageal reflux disease without esophagitis: Secondary | ICD-10-CM | POA: Diagnosis not present

## 2015-12-29 DIAGNOSIS — L821 Other seborrheic keratosis: Secondary | ICD-10-CM | POA: Diagnosis not present

## 2015-12-29 DIAGNOSIS — Z08 Encounter for follow-up examination after completed treatment for malignant neoplasm: Secondary | ICD-10-CM | POA: Diagnosis not present

## 2015-12-29 DIAGNOSIS — Z1283 Encounter for screening for malignant neoplasm of skin: Secondary | ICD-10-CM | POA: Diagnosis not present

## 2015-12-29 DIAGNOSIS — L111 Transient acantholytic dermatosis [Grover]: Secondary | ICD-10-CM | POA: Diagnosis not present

## 2015-12-29 DIAGNOSIS — L570 Actinic keratosis: Secondary | ICD-10-CM | POA: Diagnosis not present

## 2015-12-29 DIAGNOSIS — Z85828 Personal history of other malignant neoplasm of skin: Secondary | ICD-10-CM | POA: Diagnosis not present

## 2016-01-06 ENCOUNTER — Encounter: Payer: Self-pay | Admitting: Internal Medicine

## 2016-01-06 ENCOUNTER — Other Ambulatory Visit: Payer: Self-pay

## 2016-01-06 DIAGNOSIS — F0280 Dementia in other diseases classified elsewhere without behavioral disturbance: Secondary | ICD-10-CM | POA: Insufficient documentation

## 2016-01-06 DIAGNOSIS — M159 Polyosteoarthritis, unspecified: Secondary | ICD-10-CM | POA: Insufficient documentation

## 2016-01-06 DIAGNOSIS — G309 Alzheimer's disease, unspecified: Secondary | ICD-10-CM

## 2016-01-06 DIAGNOSIS — M109 Gout, unspecified: Secondary | ICD-10-CM | POA: Insufficient documentation

## 2016-01-06 DIAGNOSIS — I480 Paroxysmal atrial fibrillation: Secondary | ICD-10-CM | POA: Insufficient documentation

## 2016-01-06 DIAGNOSIS — Z8546 Personal history of malignant neoplasm of prostate: Secondary | ICD-10-CM | POA: Insufficient documentation

## 2016-01-06 DIAGNOSIS — I10 Essential (primary) hypertension: Secondary | ICD-10-CM | POA: Insufficient documentation

## 2016-01-12 ENCOUNTER — Non-Acute Institutional Stay: Payer: Medicare Other | Admitting: Internal Medicine

## 2016-01-12 ENCOUNTER — Encounter: Payer: Self-pay | Admitting: Internal Medicine

## 2016-01-12 VITALS — BP 120/58 | HR 74 | Temp 97.6°F | Resp 16 | Wt 137.6 lb

## 2016-01-12 DIAGNOSIS — I10 Essential (primary) hypertension: Secondary | ICD-10-CM

## 2016-01-12 DIAGNOSIS — I2510 Atherosclerotic heart disease of native coronary artery without angina pectoris: Secondary | ICD-10-CM | POA: Diagnosis not present

## 2016-01-12 DIAGNOSIS — G309 Alzheimer's disease, unspecified: Secondary | ICD-10-CM | POA: Diagnosis not present

## 2016-01-12 DIAGNOSIS — K219 Gastro-esophageal reflux disease without esophagitis: Secondary | ICD-10-CM | POA: Insufficient documentation

## 2016-01-12 DIAGNOSIS — I480 Paroxysmal atrial fibrillation: Secondary | ICD-10-CM | POA: Diagnosis not present

## 2016-01-12 DIAGNOSIS — F39 Unspecified mood [affective] disorder: Secondary | ICD-10-CM

## 2016-01-12 DIAGNOSIS — F0280 Dementia in other diseases classified elsewhere without behavioral disturbance: Secondary | ICD-10-CM

## 2016-01-12 DIAGNOSIS — J387 Other diseases of larynx: Secondary | ICD-10-CM

## 2016-01-12 DIAGNOSIS — Z8546 Personal history of malignant neoplasm of prostate: Secondary | ICD-10-CM

## 2016-01-12 NOTE — Assessment & Plan Note (Signed)
Some recent mood problems Will continue for now but may be able to wean off if he does well here

## 2016-01-12 NOTE — Progress Notes (Signed)
Subjective:    Patient ID: Austin [name], male    DOB: 08-16-1930, 82 y.o.   MRN: [id number]  HPI Initial assisted living visit to establish care Reviewed status with [facility]) and previous records Daughter is here  Had been living alone in [place] Widowed 7 years ago Has had some cognitive decline that goes back for some years (even when wife still alive) Diagnosed with Alzheimers In past year, significant functional decline 3 hospital visits this winter--- last time after falling down steps at home. Had significant bruising but no major intracranial injuries Deferred on the rehab and instead moved with daughter here--and had home therapy  MI some years ago--small  Also diagnosed with atrial fibrillation--at one of the hospital visits. May have been stimulated by an albuterol neb Rx in ER Did have stint on amiodarone and shocked twice Was on blood thinners then also Amiodarone stopped after 2 weeks--?balance problems, etc (may have caused the fall) Does implanted monitor--download apparently going to [place] still  Mostly independent with ADLs So far happy here  Some degree of arthritis Neck will be stiff at times Low back pain at times  Does have some GERD Has seen Dr Juengel--referred by PCP in [facility] due to sore throat--found granuloma On acid Rx and then he is going to recheck--may need biopsy  Had some mood problems Started on citalopram recently  Current Outpatient Prescriptions on File Prior to Visit  Medication Sig Dispense Refill  . acetaminophen (TYLENOL) 500 MG tablet Take 1 tablet by mouth every 6 (six) hours as needed.    Marland Kitchen aspirin EC 81 MG tablet Take 1 tablet by mouth daily.    . Cholecalciferol (VITAMIN D3) 2000 units capsule Take 1 capsule by mouth 2 (two) times daily.    . citalopram (CELEXA) 10 MG tablet Take 10 mg by mouth daily.    . Coenzyme Q-10 200 MG CAPS Take 1 capsule by mouth.    . Cyanocobalamin (RA VITAMIN B-12 TR) 1000 MCG  TBCR Take 1 tablet by mouth daily.    Marland Kitchen docusate sodium (COLACE) 100 MG capsule Take 1 capsule by mouth at bedtime.    . donepezil (ARICEPT) 10 MG tablet Take 1 tablet by mouth at bedtime.    . Magnesium Oxide 400 (240 Mg) MG TABS Take 1 tablet by mouth 2 (two) times daily.    . Multiple Vitamins-Minerals (CENTRUM SILVER PO) Take 1 tablet by mouth.    . ranitidine (ZANTAC) 150 MG tablet Take 1 tablet by mouth at bedtime.    Marland Kitchen telmisartan (MICARDIS) 40 MG tablet Take 40 mg by mouth 2 (two) times daily at 10 AM and 5 PM.    . Turmeric POWD Take 1 tablet by mouth daily.    Marland Kitchen Ubiquinol 200 MG CAPS Take 1 capsule by mouth daily.     No current facility-administered medications on file prior to visit.    Allergies  Allergen Reactions  . Amiodarone Other (See Comments)    Trouble waking/falls  . Multaq [Dronedarone] Other (See Comments)    unknown    Past Medical History  Diagnosis Date  . History of prostate cancer   . HTN (hypertension)   . Atrial fibrillation ([place])   . RBBB   . Osteoarthritis, generalized     DJD neck and spine  . Gout   . Alzheimer's dementia   . Coronary atherosclerosis of native coronary artery     MI 2014  . LPRD (laryngopharyngeal reflux disease)  Past Surgical History  Procedure Laterality Date  . Appendectomy    . Abdominal adhesion surgery    . Shoulder arthroscopy    . Prostatectomy    . Treatment fistula anal    . Inguinal hernia repair Left     No family history on file.  Social History   Social History  . Marital Status: Widowed    Spouse Name: N/A  . Number of Children: 3  . Years of Education: N/A   Occupational History  . 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 Smoking status: Never Smoker   . Smokeless tobacco: Never Used  . Alcohol Use: Yes     Comment: 1 martini/ day  . Drug Use: No  . Sexual Activity: Not on file   Other Topics Concern  . Not on file   Social History  Narrative    Review of Systems  Constitutional: Negative for fatigue.       Had lost weight after being widowed--down after injuries/hospital Close to his recent baseline  HENT: Positive for hearing loss.        Right hearing aide Teeth are in good shape-- has seen dentist here already  Eyes: Negative for visual disturbance.       Wears glasses  Respiratory: Negative for cough, chest tightness and shortness of breath.   Cardiovascular: Negative for chest pain, palpitations and leg swelling.  Gastrointestinal: Negative for abdominal pain, constipation and blood in stool.  Endocrine: Negative for polydipsia and polyuria.  Genitourinary: Negative for urgency and difficulty urinating.  Musculoskeletal: Positive for back pain and arthralgias. Negative for joint swelling.  Skin: Negative for rash.       No suspicious lesions  Allergic/Immunologic: Negative for environmental allergies and immunocompromised state.  Neurological: Negative for dizziness, syncope, light-headedness and headaches.  Hematological: Negative for adenopathy. Does not bruise/bleed easily.  Psychiatric/Behavioral: Positive for dysphoric mood. Negative for sleep disturbance. The patient is not nervous/anxious.        Objective:   Physical Exam  Constitutional: He is oriented to person, place, and time. He appears well-developed and well-nourished. No distress.  HENT:  Mouth/Throat: Oropharynx is clear and moist. No oropharyngeal exudate.  Neck: Normal range of motion. Neck supple.  Cardiovascular: Normal rate, regular rhythm, normal heart sounds and intact distal pulses.  Exam reveals no gallop.   No murmur heard. Pulmonary/Chest: Effort normal and breath sounds normal. No respiratory distress. He has no wheezes. He has no rales.  Abdominal: Soft. There is no tenderness.  Musculoskeletal: He exhibits no edema or tenderness.  Lymphadenopathy:    He has no cervical adenopathy.  Neurological: He is alert and  oriented to person, place, and time.  Skin: No rash noted. No erythema.  Psychiatric: He has a normal mood and affect. His behavior is normal.          Assessment & Plan:

## 2016-01-12 NOTE — Assessment & Plan Note (Signed)
Has nodule that may need biopsy On H2 blocker for now

## 2016-01-12 NOTE — Assessment & Plan Note (Signed)
Surgery ~15 years ago

## 2016-01-12 NOTE — Assessment & Plan Note (Signed)
"  small" MI per daughter No symptoms now

## 2016-01-12 NOTE — Assessment & Plan Note (Signed)
Mild Seems to be functionally appropriate for AL here On donepezil

## 2016-01-12 NOTE — Assessment & Plan Note (Signed)
Did have cardioversion Has monitor and presumably not recurred but not sure of status Will get set up with cardiology here to take over his care Aspirin only for now--but would recommend NOAC if has a fib on monitor

## 2016-01-12 NOTE — Assessment & Plan Note (Signed)
BP Readings from Last 3 Encounters:  01/12/16 120/58  11/20/15 137/86  06/26/15 160/87   Good control

## 2016-01-24 ENCOUNTER — Emergency Department: Payer: Medicare Other

## 2016-01-24 ENCOUNTER — Encounter: Payer: Self-pay | Admitting: Emergency Medicine

## 2016-01-24 ENCOUNTER — Emergency Department
Admission: EM | Admit: 2016-01-24 | Discharge: 2016-01-24 | Disposition: A | Discharge status: Home / Self Care | Payer: Medicare Other | Attending: Emergency Medicine | Admitting: Emergency Medicine

## 2016-01-24 DIAGNOSIS — M799 Soft tissue disorder, unspecified: Secondary | ICD-10-CM | POA: Insufficient documentation

## 2016-01-24 DIAGNOSIS — I2510 Atherosclerotic heart disease of native coronary artery without angina pectoris: Secondary | ICD-10-CM | POA: Diagnosis not present

## 2016-01-24 DIAGNOSIS — F0281 Dementia in other diseases classified elsewhere with behavioral disturbance: Secondary | ICD-10-CM | POA: Diagnosis not present

## 2016-01-24 DIAGNOSIS — I10 Essential (primary) hypertension: Secondary | ICD-10-CM | POA: Diagnosis not present

## 2016-01-24 DIAGNOSIS — M5030 Other cervical disc degeneration, unspecified cervical region: Secondary | ICD-10-CM | POA: Diagnosis not present

## 2016-01-24 DIAGNOSIS — I4891 Unspecified atrial fibrillation: Secondary | ICD-10-CM | POA: Diagnosis not present

## 2016-01-24 DIAGNOSIS — G309 Alzheimer's disease, unspecified: Secondary | ICD-10-CM | POA: Insufficient documentation

## 2016-01-24 DIAGNOSIS — Z791 Long term (current) use of non-steroidal anti-inflammatories (NSAID): Secondary | ICD-10-CM | POA: Insufficient documentation

## 2016-01-24 DIAGNOSIS — Z8546 Personal history of malignant neoplasm of prostate: Secondary | ICD-10-CM | POA: Insufficient documentation

## 2016-01-24 DIAGNOSIS — Z7982 Long term (current) use of aspirin: Secondary | ICD-10-CM | POA: Diagnosis not present

## 2016-01-24 DIAGNOSIS — M79605 Pain in left leg: Secondary | ICD-10-CM | POA: Diagnosis present

## 2016-01-24 DIAGNOSIS — M7989 Other specified soft tissue disorders: Secondary | ICD-10-CM | POA: Diagnosis not present

## 2016-01-24 NOTE — ED Triage Notes (Signed)
Pt in with co left leg swelling from knee to to left ankle no hx of DVT's was sent here from [facility] for rule out of the same. Pt not on blood thinners but is on asa 81 mg daily.

## 2016-01-24 NOTE — ED Notes (Signed)
Pt ambulatory to u/s accomp by u/s tech

## 2016-01-24 NOTE — ED Notes (Signed)
Pt discharged to home.  Family member driving.  Discharge instructions reviewed.  Verbalized understanding.  No questions or concerns at this time.  Teach back verified.  Pt in NAD.  No items left in ED.   

## 2016-01-24 NOTE — ED Notes (Signed)
Patient ambulatory to triage with steady gait, without difficulty or distress noted; pt reports left lower leg swelling/pain approx 10-15 days, increased since yesterday; sent from NH with his daughter to have u/s performed; pt denies any accomp symptoms

## 2016-01-24 NOTE — Discharge Instructions (Signed)
Please seek medical attention for any high fevers, chest pain, shortness of breath, change in behavior, persistent vomiting, bloody stool or any other new or concerning symptoms.  

## 2016-01-24 NOTE — ED Provider Notes (Signed)
[facility] Emergency Department Provider Note   ____________________________________________   I have reviewed the triage vital signs and the nursing notes.   HISTORY  Chief Complaint Leg Pain   History limited by: Not Limited   HPI Austin [name] is a 82 y.o. male who presents to the emergency department today from [facility] because of concerns for left leg swelling and possible DVT. The swelling was noticed after this weekend. It is located in the left leg from the knee down. It has been associated with some mild discomfort. The patient denies any trauma to his leg. His never had similar symptoms in the past. Sent to the emergency department today because of concerns for deep vein thrombosis. Patient denies any fevers, chest pain or shortness breath.   Past Medical History:  Diagnosis Date  . Alzheimer's dementia   . Atrial fibrillation ([place])   . Coronary atherosclerosis of native coronary artery    MI 2014  . Gout   . History of prostate cancer   . HTN (hypertension)   . LPRD (laryngopharyngeal reflux disease)   . Osteoarthritis, generalized    DJD neck and spine  . RBBB     Patient Active Problem List   Diagnosis Date Noted  . Mood disorder ([place]) 01/12/2016  . Coronary atherosclerosis of native coronary artery   . LPRD (laryngopharyngeal reflux disease)   . HTN (hypertension)   . History of prostate cancer   . Atrial fibrillation ([place])   . Osteoarthritis, generalized   . Gout   . Alzheimer's dementia     Past Surgical History:  Procedure Laterality Date  . ABDOMINAL ADHESION SURGERY    . APPENDECTOMY    . INGUINAL HERNIA REPAIR Left   . PROSTATECTOMY    . SHOULDER ARTHROSCOPY    . TREATMENT FISTULA ANAL      Current Outpatient Rx  . Order #: [id number] Class: Historical Med  . Order #: [id number] Class: Historical Med  . Order #: [id number] Class: Historical Med  . Order #: [id number] Class: Historical Med  . Order #: [id number] Class:  Historical Med  . Order #: [id number] Class: Historical Med  . Order #: [id number] Class: Historical Med  . Order #: [id number] Class: Historical Med  . Order #: [id number] Class: Historical Med  . Order #: [id number] Class: Historical Med  . Order #: [id number] Class: Historical Med  . Order #: [id number] Class: Historical Med  . Order #: [id number] Class: Historical Med  . Order #: [id number] Class: Historical Med  . Order #: [id number] Class: Historical Med    Allergies Amiodarone and Multaq [dronedarone]  Family History  Problem Relation Age of Onset  . Hypertension Mother   . Cancer Neg Hx   . Heart disease Neg Hx   . Diabetes Neg Hx     Social History Social History  Substance Use Topics  . Smoking status: Never Smoker  . Smokeless tobacco: Never Used  . Alcohol use Yes     Comment: 1 martini/ day    Review of Systems  Constitutional: Negative for fever. Cardiovascular: Negative for chest pain. Respiratory: Negative for shortness of breath. Gastrointestinal: Negative for abdominal pain, vomiting and diarrhea. Genitourinary: Negative for dysuria. Musculoskeletal: Positive for left leg swelling and pain Neurological: Negative for headaches, focal weakness or numbness.   10-point ROS otherwise negative.  ____________________________________________   PHYSICAL EXAM:  VITAL SIGNS: ED Triage Vitals [01/24/16 2025]  Enc Vitals Group     BP (!) 120/56     Pulse Rate 81     Resp 18  Temp 99.5 F (37.5 C)     Temp Source Oral     SpO2 99 %     Weight 145 lb (65.8 kg)     Height 5\' 8"  (1.727 m)     Head Circumference      Peak Flow      Pain Score 0   Constitutional: Alert and oriented. Well appearing and in no distress. Eyes: Conjunctivae are normal. PERRL. Normal extraocular movements. ENT   Head: Normocephalic and atraumatic.   Nose: No congestion/rhinnorhea.   Mouth/Throat: Mucous membranes are moist.   Neck: No  stridor. Hematological/Lymphatic/Immunilogical: No cervical lymphadenopathy. Cardiovascular: Normal rate, regular rhythm.  No murmurs, rubs, or gallops. Respiratory: Normal respiratory effort without tachypnea nor retractions. Breath sounds are clear and equal bilaterally. No wheezes/rales/rhonchi. Gastrointestinal: Soft and nontender. No distention. Genitourinary: Deferred Musculoskeletal: Normal range of motion in all extremities. No joint effusions. Left leg with 1+ pitting edema from the knee down. In addition there is a slight effusion within the knee joint. No redness, erythema or warmth to the leg. No tenderness to palpation of the calf. Compartments are soft. Neurologic:  Normal speech and language. No gross focal neurologic deficits are appreciated.  Skin:  Skin is warm, dry and intact. No rash noted. Psychiatric: Mood and affect are normal. Speech and behavior are normal. Patient exhibits appropriate insight and judgment.  ____________________________________________    LABS (pertinent positives/negatives)  None  ____________________________________________   EKG  None  ____________________________________________    RADIOLOGY  Left Lower Extremity Doppler IMPRESSION: No evidence of deep venous thrombosis in the left lower extremity.  ____________________________________________   PROCEDURES  Procedures  ____________________________________________   INITIAL IMPRESSION / ASSESSMENT AND PLAN / ED COURSE  Pertinent labs & imaging results that were available during my care of the patient were reviewed by me and considered in my medical decision making (see chart for details).  Patient presented to the emergency department today because of concerns for left leg swelling and possible blood clot. Ultrasound without any concerning findings. On exam no erythema or warmth concerning for cellulitis. Calf is nontender in all compartments are soft. This point unclear  etiology of this one. Did advise compression stockings and elevation. Advised PCP follow up. ____________________________________________   FINAL CLINICAL IMPRESSION(S) / ED DIAGNOSES  Final diagnoses:  Left leg swelling     Note: This dictation was prepared with Dragon dictation. Any transcriptional errors that result from this process are unintentional    Nance Pear, MD 01/24/16 2239

## 2016-01-26 DIAGNOSIS — M542 Cervicalgia: Secondary | ICD-10-CM | POA: Diagnosis not present

## 2016-01-26 DIAGNOSIS — M545 Low back pain: Secondary | ICD-10-CM | POA: Diagnosis not present

## 2016-01-27 ENCOUNTER — Non-Acute Institutional Stay: Payer: Medicare Other | Admitting: Internal Medicine

## 2016-01-27 ENCOUNTER — Encounter: Payer: Medicare Other | Admitting: Internal Medicine

## 2016-01-27 VITALS — BP 132/76 | HR 64 | Temp 98.3°F | Resp 16

## 2016-01-27 DIAGNOSIS — M25562 Pain in left knee: Secondary | ICD-10-CM | POA: Diagnosis not present

## 2016-01-27 DIAGNOSIS — M25462 Effusion, left knee: Secondary | ICD-10-CM | POA: Diagnosis not present

## 2016-01-27 DIAGNOSIS — R600 Localized edema: Secondary | ICD-10-CM

## 2016-01-27 DIAGNOSIS — M545 Low back pain: Secondary | ICD-10-CM | POA: Diagnosis not present

## 2016-01-27 DIAGNOSIS — M542 Cervicalgia: Secondary | ICD-10-CM | POA: Diagnosis not present

## 2016-01-27 MED ORDER — NAPROXEN SODIUM 220 MG PO TABS: 220.0000 mg | ORAL_TABLET | Freq: Every day | ORAL | 2 refills | Status: DC

## 2016-01-27 MED ORDER — FUROSEMIDE 20 MG PO TABS: ORAL_TABLET | ORAL | 0 refills | Status: DC

## 2016-01-27 NOTE — Patient Instructions (Signed)
Generic Knee Exercises EXERCISES RANGE OF MOTION (ROM) AND STRETCHING EXERCISES These exercises may help you when beginning to rehabilitate your injury. Your symptoms may resolve with or without further involvement from your physician, physical therapist, or athletic trainer. While completing these exercises, remember:   Restoring tissue flexibility helps normal motion to return to the joints. This allows healthier, less painful movement and activity.  An effective stretch should be held for at least 30 seconds.  A stretch should never be painful. You should only feel a gentle lengthening or release in the stretched tissue. STRETCH - Knee Extension, Prone  Lie on your stomach on a firm surface, such as a bed or countertop. Place your right / left knee and leg just beyond the edge of the surface. You may wish to place a towel under the far end of your right / left thigh for comfort.  Relax your leg muscles and allow gravity to straighten your knee. Your clinician may advise you to add an ankle weight if more resistance is helpful for you.  You should feel a stretch in the back of your right / left knee. Hold this position for __________ seconds. Repeat __________ times. Complete this stretch __________ times per day. * Your physician, physical therapist, or athletic trainer may ask you to add ankle weight to enhance your stretch.  RANGE OF MOTION - Knee Flexion, Active  Lie on your back with both knees straight. (If this causes back discomfort, bend your opposite knee, placing your foot flat on the floor.)  Slowly slide your heel back toward your buttocks until you feel a gentle stretch in the front of your knee or thigh.  Hold for __________ seconds. Slowly slide your heel back to the starting position. Repeat __________ times. Complete this exercise __________ times per day.  STRETCH - Quadriceps, Prone   Lie on your stomach on a firm surface, such as a bed or padded floor.  Bend your  right / left knee and grasp your ankle. If you are unable to reach your ankle or pant leg, use a belt around your foot to lengthen your reach.  Gently pull your heel toward your buttocks. Your knee should not slide out to the side. You should feel a stretch in the front of your thigh and/or knee.  Hold this position for __________ seconds. Repeat __________ times. Complete this stretch __________ times per day.  STRETCH - Hamstrings, Supine   Lie on your back. Loop a belt or towel over the ball of your right / left foot.  Straighten your right / left knee and slowly pull on the belt to raise your leg. Do not allow the right / left knee to bend. Keep your opposite leg flat on the floor.  Raise the leg until you feel a gentle stretch behind your right / left knee or thigh. Hold this position for __________ seconds. Repeat __________ times. Complete this stretch __________ times per day.  STRENGTHENING EXERCISES These exercises may help you when beginning to rehabilitate your injury. They may resolve your symptoms with or without further involvement from your physician, physical therapist, or athletic trainer. While completing these exercises, remember:   Muscles can gain both the endurance and the strength needed for everyday activities through controlled exercises.  Complete these exercises as instructed by your physician, physical therapist, or athletic trainer. Progress the resistance and repetitions only as guided.  You may experience muscle soreness or fatigue, but the pain or discomfort you are trying to   eliminate should never worsen during these exercises. If this pain does worsen, stop and make certain you are following the directions exactly. If the pain is still present after adjustments, discontinue the exercise until you can discuss the trouble with your clinician. STRENGTH - Quadriceps, Isometrics  Lie on your back with your right / left leg extended and your opposite knee  bent.  Gradually tense the muscles in the front of your right / left thigh. You should see either your knee cap slide up toward your hip or increased dimpling just above the knee. This motion will push the back of the knee down toward the floor/mat/bed on which you are lying.  Hold the muscle as tight as you can without increasing your pain for __________ seconds.  Relax the muscles slowly and completely in between each repetition. Repeat __________ times. Complete this exercise __________ times per day.  STRENGTH - Quadriceps, Short Arcs   Lie on your back. Place a __________ inch towel roll under your knee so that the knee slightly bends.  Raise only your lower leg by tightening the muscles in the front of your thigh. Do not allow your thigh to rise.  Hold this position for __________ seconds. Repeat __________ times. Complete this exercise __________ times per day.  OPTIONAL ANKLE WEIGHTS: Begin with ____________________, but DO NOT exceed ____________________. Increase in 1 pound/0.5 kilogram increments.  STRENGTH - Quadriceps, Straight Leg Raises  Quality counts! Watch for signs that the quadriceps muscle is working to insure you are strengthening the correct muscles and not "cheating" by substituting with healthier muscles.  Lay on your back with your right / left leg extended and your opposite knee bent.  Tense the muscles in the front of your right / left thigh. You should see either your knee cap slide up or increased dimpling just above the knee. Your thigh may even quiver.  Tighten these muscles even more and raise your leg 4 to 6 inches off the floor. Hold for __________ seconds.  Keeping these muscles tense, lower your leg.  Relax the muscles slowly and completely in between each repetition. Repeat __________ times. Complete this exercise __________ times per day.  STRENGTH - Hamstring, Curls  Lay on your stomach with your legs extended. (If you lay on a bed, your feet  may hang over the edge.)  Tighten the muscles in the back of your thigh to bend your right / left knee up to 90 degrees. Keep your hips flat on the bed/floor.  Hold this position for __________ seconds.  Slowly lower your leg back to the starting position. Repeat __________ times. Complete this exercise __________ times per day.  OPTIONAL ANKLE WEIGHTS: Begin with ____________________, but DO NOT exceed ____________________. Increase in 1 pound/0.5 kilogram increments.  STRENGTH - Quadriceps, Squats  Stand in a door frame so that your feet and knees are in line with the frame.  Use your hands for balance, not support, on the frame.  Slowly lower your weight, bending at the hips and knees. Keep your lower legs upright so that they are parallel with the door frame. Squat only within the range that does not increase your knee pain. Never let your hips drop below your knees.  Slowly return upright, pushing with your legs, not pulling with your hands. Repeat __________ times. Complete this exercise __________ times per day.  STRENGTH - Quadriceps, Wall Slides  Follow guidelines for form closely. Increased knee pain often results from poorly placed feet or knees.    Lean against a smooth wall or door and walk your feet out 18-24 inches. Place your feet hip-width apart.  Slowly slide down the wall or door until your knees bend __________ degrees.* Keep your knees over your heels, not your toes, and in line with your hips, not falling to either side.  Hold for __________ seconds. Stand up to rest for __________ seconds in between each repetition. Repeat __________ times. Complete this exercise __________ times per day. * Your physician, physical therapist, or athletic trainer will alter this angle based on your symptoms and progress.   This information is not intended to replace advice given to you by your health care provider. Make sure you discuss any questions you have with your health care  provider.   Document Released: 05/02/2005 Document Revised: 07/09/2014 Document Reviewed: 09/30/2008 Elsevier Interactive Patient Education 2016 [employer] Inc[employer]  

## 2016-01-27 NOTE — Progress Notes (Signed)
Subjective:    Patient ID: Austin [name], male    DOB: Aug 06, 1930, 82 y.o.   MRN: [id number]  HPI  Asked to evaluate resident in [address] Left knee pain and left lower extremity swelling for months. ER visit 7/25 for the same, LLE [place] negative for DVT Denies any recent injury to the area but does have gout- reports this feels different He is having some trouble walking, no numbness or tingling Has taken Indomethacin and Tylenol with some relief.  Review of Systems  Past Medical History:  Diagnosis Date  . Alzheimer's dementia   . Atrial fibrillation ([place])   . Coronary atherosclerosis of native coronary artery    MI 2014  . Gout   . History of prostate cancer   . HTN (hypertension)   . LPRD (laryngopharyngeal reflux disease)   . Osteoarthritis, generalized    DJD neck and spine  . RBBB     Current Outpatient Prescriptions  Medication Sig Dispense Refill  . acetaminophen (TYLENOL) 500 MG tablet Take 1 tablet by mouth every 6 (six) hours as needed.    Marland Kitchen Apoaequorin (PREVAGEN) 10 MG CAPS Take 1 capsule by mouth daily.    Marland Kitchen aspirin EC 81 MG tablet Take 1 tablet by mouth daily.    . Cholecalciferol (VITAMIN D3) 2000 units capsule Take 1 capsule by mouth 2 (two) times daily.    . citalopram (CELEXA) 10 MG tablet Take 10 mg by mouth daily.    . Coenzyme Q-10 200 MG CAPS Take 1 capsule by mouth.    . Cyanocobalamin (RA VITAMIN B-12 TR) 1000 MCG TBCR Take 1 tablet by mouth daily.    Marland Kitchen docusate sodium (COLACE) 100 MG capsule Take 1 capsule by mouth at bedtime.    . donepezil (ARICEPT) 10 MG tablet Take 1 tablet by mouth at bedtime.    . Magnesium Oxide 400 (240 Mg) MG TABS Take 1 tablet by mouth 2 (two) times daily.    . Multiple Vitamins-Minerals (CENTRUM SILVER PO) Take 1 tablet by mouth.    . ranitidine (ZANTAC) 150 MG tablet Take 1 tablet by mouth at bedtime.    Marland Kitchen telmisartan (MICARDIS) 40 MG tablet Take 40 mg by mouth 2 (two) times daily at 10 AM and 5 PM.    . Turmeric POWD  Take 1 tablet by mouth daily.    Marland Kitchen Ubiquinol 200 MG CAPS Take 1 capsule by mouth daily.     No current facility-administered medications for this visit.     Allergies  Allergen Reactions  . Amiodarone Other (See Comments)    Trouble waking/falls  . Multaq [Dronedarone] Other (See Comments)    unknown    Family History  Problem Relation Age of Onset  . Hypertension Mother   . Cancer Neg Hx   . Heart disease Neg Hx   . Diabetes Neg Hx     Social History   Social History  . Marital status: Widowed    Spouse name: N/A  . Number of children: 3  . Years of education: N/A   Occupational History  . 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 Smoking status: Never Smoker  . Smokeless tobacco: Never Used  . Alcohol use Yes     Comment: 1 martini/ day  . Drug use: No  . Sexual activity: Not on file   Other Topics Concern  . Not on file   Social History Narrative   Has  living will   All three children are health care POA   Would accept resuscitation attempts   No tube feeds if cognitively unaware     Constitutional: Denies fever, malaise, fatigue, headache or abrupt weight changes.  Respiratory: Denies difficulty breathing, shortness of breath, cough or sputum production.   Cardiovascular: Pt reports left lower leg swelling. Denies chest pain, chest tightness, palpitations or swelling in the hands or feet.  Musculoskeletal: Pt reports left knee pain. Denies decrease in range of motion, muscle pain.  Skin: Denies redness, rashes, lesions or ulcercations.  Neurological: Denies numbness, tingling in lower extremeties or problems with balance and coordination.    No other specific complaints in a complete review of systems (except as listed in HPI above).     Objective:   Physical Exam  BP 132/76   Pulse 64   Temp 98.3 F (36.8 C)   Resp 16  Wt Readings from Last 3 Encounters:  01/24/16 145 lb (65.8 kg)  01/12/16 137 lb 9.6 oz  (62.4 kg)  11/20/15 145 lb (65.8 kg)    General: Appears his stated age, well developed, well nourished in NAD. Skin: Warm, dry and intact. No redness or warmth noted. Cardiovascular: Normal rate and rhythm. S1,S2 noted.  No murmur, rubs or gallops noted. 1+ pitting edema RLE. Pulmonary/Chest: Normal effort and positive vesicular breath sounds.  Musculoskeletal: Normal flexion and extension of the left knee. Joint enlarged with peripatellar swelling noted. No pain with palpation. He is walking with a slight limp. Neurological: Alert and oriented. Sensation intact to BLE.   BMET    Component Value Date/Time   NA 138 11/20/2015 0145   K 3.8 11/20/2015 0145   CL 102 11/20/2015 0145   CO2 28 11/20/2015 0145   GLUCOSE 119 (H) 11/20/2015 0145   BUN 20 11/20/2015 0145   CREATININE 0.94 11/20/2015 0145   CALCIUM 9.3 11/20/2015 0145   GFRNONAA >60 11/20/2015 0145   GFRAA >60 11/20/2015 0145    Lipid Panel  No results found for: CHOL, TRIG, HDL, CHOLHDL, VLDL, LDLCALC  CBC    Component Value Date/Time   WBC 7.6 11/20/2015 0145   RBC 4.39 (L) 11/20/2015 0145   HGB 13.7 11/20/2015 0145   HCT 40.1 11/20/2015 0145   PLT 140 (L) 11/20/2015 0145   MCV 91.3 11/20/2015 0145   MCH 31.2 11/20/2015 0145   MCHC 34.1 11/20/2015 0145   RDW 14.4 11/20/2015 0145    Hgb A1C No results found for: HGBA1C          Assessment & Plan:   Left knee pain:  Not c/w gout Likely inflammatory arthritis Xray left knee Stop Indomethacin Start Aleve once daily He is already working with PT  Edema, LLE:  [place] negative for DVT in ER Continue TED hose Lasix 20 mg daily x 3 days Encouraged elevation  Will reassess as needed Webb Silversmith, NP

## 2016-01-30 DIAGNOSIS — I480 Paroxysmal atrial fibrillation: Secondary | ICD-10-CM | POA: Diagnosis not present

## 2016-01-31 DIAGNOSIS — M542 Cervicalgia: Secondary | ICD-10-CM | POA: Diagnosis not present

## 2016-01-31 DIAGNOSIS — J383 Other diseases of vocal cords: Secondary | ICD-10-CM | POA: Diagnosis not present

## 2016-01-31 DIAGNOSIS — K219 Gastro-esophageal reflux disease without esophagitis: Secondary | ICD-10-CM | POA: Diagnosis not present

## 2016-01-31 DIAGNOSIS — M545 Low back pain: Secondary | ICD-10-CM | POA: Diagnosis not present

## 2016-02-02 DIAGNOSIS — M542 Cervicalgia: Secondary | ICD-10-CM | POA: Diagnosis not present

## 2016-02-02 DIAGNOSIS — M545 Low back pain: Secondary | ICD-10-CM | POA: Diagnosis not present

## 2016-02-03 DIAGNOSIS — M542 Cervicalgia: Secondary | ICD-10-CM | POA: Diagnosis not present

## 2016-02-03 DIAGNOSIS — M545 Low back pain: Secondary | ICD-10-CM | POA: Diagnosis not present

## 2016-02-06 DIAGNOSIS — M545 Low back pain: Secondary | ICD-10-CM | POA: Diagnosis not present

## 2016-02-06 DIAGNOSIS — M542 Cervicalgia: Secondary | ICD-10-CM | POA: Diagnosis not present

## 2016-02-07 DIAGNOSIS — M542 Cervicalgia: Secondary | ICD-10-CM | POA: Diagnosis not present

## 2016-02-07 DIAGNOSIS — M545 Low back pain: Secondary | ICD-10-CM | POA: Diagnosis not present

## 2016-02-15 ENCOUNTER — Encounter: Payer: Self-pay | Admitting: Internal Medicine

## 2016-02-17 DIAGNOSIS — M542 Cervicalgia: Secondary | ICD-10-CM | POA: Diagnosis not present

## 2016-02-17 DIAGNOSIS — M545 Low back pain: Secondary | ICD-10-CM | POA: Diagnosis not present

## 2016-02-21 DIAGNOSIS — M542 Cervicalgia: Secondary | ICD-10-CM | POA: Diagnosis not present

## 2016-02-21 DIAGNOSIS — M545 Low back pain: Secondary | ICD-10-CM | POA: Diagnosis not present

## 2016-02-21 NOTE — Progress Notes (Signed)
Cardiology Office Note   Date:  02/22/2016   ID:  Austin [name], DOB 05/27/1931, MRN [id number]  Referring Doctor:  Viviana Simpler, MD   Cardiologist:   Wende Bushy, MD   Reason for consultation:  Chief Complaint  Patient presents with  . Other    Ref by Dr. Silvio Pate to establish care for A-Fib, has a reveal device implanted (Medtronic). Pt. c/o a-fib episodes.       History of Present Illness: Austin [name] is a 82 y.o. male who presents for Establishing cardiology care.  Patient used to see a Dr. Posey Pronto in [place] for cardiology care. He cares a diagnosis of paroxysmal atrial fibrillation, right bundle branch block, and hypertension.  In terms of the atrial fibrillation, per patient and daughter reports, and review of extensive medical records, it appears that this may been diagnosed sometime in July 2014. He was placed on Eliquis at that time. A 30 day monitor did not reveal any recurrence of atrial fibrillation and hence eliquis was discontinued. Patient did not recall any intolerance or side effects from eliquis. He then had a reveal loop recorder device placed at that time.   In 08/10/2015, he had a recurrence of atrial fibrillation while he was in the hospital, which was thought to be related after use of an albuterol inhaler. He had cardioversions done at that time. He was also subsequently placed on amiodarone. However, he developed subsequent gait instability and falls. These were thought to be related to amiodarone use and therefore amiodarone was eventually discontinued.  In terms of atrial fibrillation symptoms, patient does not report feeling any change in the rhythm.. He does not report palpitations or skipped beats. Daughter says that he does have some underlying memory issues and she is not sure if this is playing a role in why he does not report any symptoms from the atrial fibrillation.  In terms of functional capacity, patient is relatively active at the  assisted living. He does not have any limitations in terms of walking. No chest pains, shortness of breath. No recent falls. No gait instability. No bleeding issues.   ROS:  Please see the history of present illness. Aside from mentioned under HPI, all other systems are reviewed and negative.     Past Medical History:  Diagnosis Date  . Alzheimer's dementia   . Atrial fibrillation ([place])   . Coronary atherosclerosis of native coronary artery    MI 2014  . Gout   . History of prostate cancer   . HTN (hypertension)   . LPRD (laryngopharyngeal reflux disease)   . Osteoarthritis, generalized    DJD neck and spine  . RBBB   . RBBB (right bundle branch block)     Past Surgical History:  Procedure Laterality Date  . ABDOMINAL ADHESION SURGERY    . APPENDECTOMY    . EP IMPLANTABLE DEVICE     Reveal device Medtronic Serial [id number]  Model [id number]  . INGUINAL HERNIA REPAIR Left   . PROSTATECTOMY    . SHOULDER ARTHROSCOPY    . TREATMENT FISTULA ANAL       reports that he has never smoked. He has never used smokeless tobacco. He reports that he drinks alcohol. He reports that he does not use drugs.   family history includes Hypertension in his mother.   Outpatient Medications Prior to Visit  Medication Sig Dispense Refill  . acetaminophen (TYLENOL) 500 MG tablet Take 1 tablet by mouth every 6 (six) hours as needed.    Marland Kitchen  Apoaequorin (PREVAGEN) 10 MG CAPS Take 1 capsule by mouth daily.    Marland Kitchen aspirin EC 81 MG tablet Take 1 tablet by mouth daily.    . Cholecalciferol (VITAMIN D3) 2000 units capsule Take 1 capsule by mouth daily.     . citalopram (CELEXA) 10 MG tablet Take 10 mg by mouth daily.    . Coenzyme Q-10 200 MG CAPS Take 1 capsule by mouth.    . Cyanocobalamin (RA VITAMIN B-12 TR) 1000 MCG TBCR Take 1 tablet by mouth daily.    Marland Kitchen docusate sodium (COLACE) 100 MG capsule Take 1 capsule by mouth at bedtime.    . donepezil (ARICEPT) 10 MG tablet Take 1 tablet by mouth at bedtime.     . furosemide (LASIX) 20 MG tablet 1 tab PO daily x 3 days 3 tablet 0  . Magnesium Oxide 400 (240 Mg) MG TABS Take 1 tablet by mouth 2 (two) times daily.    . Multiple Vitamins-Minerals (CENTRUM SILVER PO) Take 1 tablet by mouth.    . ranitidine (ZANTAC) 150 MG tablet Take 1 tablet by mouth at bedtime.    Marland Kitchen telmisartan (MICARDIS) 40 MG tablet Take 40 mg by mouth 2 (two) times daily at 10 AM and 5 PM.    . Ubiquinol 200 MG CAPS Take 1 capsule by mouth daily.    . Turmeric POWD Take 1 tablet by mouth daily.    . naproxen sodium (ALEVE) 220 MG tablet Take 1 tablet (220 mg total) by mouth daily. (Patient not taking: Reported on 02/22/2016) 30 tablet 2   No facility-administered medications prior to visit.      Allergies: Amiodarone and Multaq [dronedarone]    PHYSICAL EXAM: VS:  BP (!) 100/56 (BP Location: Right Arm, Patient Position: Sitting, Cuff Size: Normal)   Pulse 68   Ht '5\' 4"'  (1.626 m)   Wt 140 lb 8 oz (63.7 kg)   BMI 24.12 kg/m  , Body mass index is 24.12 kg/m. Wt Readings from Last 3 Encounters:  02/22/16 140 lb 8 oz (63.7 kg)  01/24/16 145 lb (65.8 kg)  01/12/16 137 lb 9.6 oz (62.4 kg)    GENERAL:  well developed, well nourished, not in acute distress HEENT: normocephalic, pink conjunctivae, anicteric sclerae, no xanthelasma, normal dentition, oropharynx clear NECK:  no neck vein engorgement, JVP normal, no hepatojugular reflux, carotid upstroke brisk and symmetric, no bruit, no thyromegaly, no lymphadenopathy LUNGS:  good respiratory effort, clear to auscultation bilaterally CV:  PMI not displaced, no thrills, no lifts, S1 and S2 within normal limits, no palpable S3 or S4, no murmurs, no rubs, no gallops ABD:  Soft, nontender, nondistended, normoactive bowel sounds, no abdominal aortic bruit, no hepatomegaly, no splenomegaly MS: nontender back, no kyphosis, no scoliosis, no joint deformities EXT:  2+ DP/PT pulses, no edema, no varicosities, no cyanosis, no clubbing SKIN:  warm, nondiaphoretic, normal turgor, no ulcers NEUROPSYCH: alert, oriented to person, place, and time, sensory/motor grossly intact, normal mood, appropriate affect  Recent Labs: 11/20/2015: ALT 22; BUN 20; Creatinine, Ser 0.94; Hemoglobin 13.7; Platelets 140; Potassium 3.8; Sodium 138   Lipid Panel No results found for: CHOL, TRIG, HDL, CHOLHDL, VLDL, LDLCALC, LDLDIRECT   Other studies Reviewed:  EKG:  The ekg from 02/22/2016 was personally reviewed by me and it revealed sinus rhythm, 60 BPM. Sinus arrhythmia. Right bundle branch block.  Additional studies/ records that were reviewed personally reviewed by me today include:  Exercise nuclear stress test 11/19/2012: Normal study. EF 65%.  Echo 11/17/2012: EF greater than 55%. Grade 1 diastolic dysfunction. Trace MR. Mild AI. PHT of 784.7 ms. RV systolic pressure is normal.  Holter monitor 10/30/2012:   asymptomatic bradycardia. Minimal heart rate 36 BPM during sleeping hours. Maximum heart rate 95 BPM. Average heart rate 59 BPM. Isolated PACs and PVCs, less than 3% burden. No sustained arrhythmias or conduction abnormalities. No symptoms reported.  Reveal device implanted 02/04/2013: November 2016: 1 AF episode lasted 20 minutes 08/19/2014: 2 episodes AF, longest one hour and 40 minutes.  Nuclear stress is 08/05/2015, [facility]: Normal cardiac perfusion imaging. EF 63%.   ASSESSMENT AND PLAN: Atrial fibrillation Likely paroxysmal. Currently in sinus rhythm. Does not appear to be symptomatic from change in rhythm. He had significant intolerance to amiodarone. CHADS2-VASc= 3 I had a lengthy discussion with the patient and the daughter who was present in the room. We went over his diagnosis, treatment options in terms of rate and rhythm control, stroke risk and treatment options for stroke risk reduction. He does not have any clear contraindication to starting an oral anticoagulant. With his risk of stroke of approximately 3.2% per year,  we have decided to retry Eliquis at 5 mg by mouth every 12 hours. We will be rechecking his blood work on his follow-up. His renal function appears to be within normal limits.  We will recommend echocardiogram as well.  We will set him up with the device clinic for follow-up.  We will inform [employer] that he has established care with our office.   Right bundle branch block Known from previous EKGs No evidence of ischemia from recent stress testing February 2017.   Hypertension BP is well controlled. Continue monitoring BP. Continue current medical therapy and lifestyle changes.  History of CAD Per patient and daughter, he was told he had a mild heart attack LVEF is within normal limits. No complaints of angina or shortness of breath. Echocardiogram has been recommended. Continue medical therapy with low-dose aspirin. Monitor for bleeding. Recheck fasting lipid panel together with his CMP.  Current medicines are reviewed at length with the patient today.  The patient does not have concerns regarding medicines.  Labs/ tests ordered today include:  Orders Placed This Encounter  Procedures  . Comp Met (CMET)  . EKG 12-Lead  . ECHOCARDIOGRAM COMPLETE    I had a lengthy and detailed discussion with the patient regarding diagnoses, prognosis, diagnostic options, treatment options , and side effects of medications.   I counseled the patient on importance of lifestyle modification including heart healthy diet, regular physical activity .   Disposition:   FU with undersigned 3 months  I spent at least 60mnutes with the patient today and more than 50% of the time was spent counseling the patient and coordinating care.  Review of extensive medical records from outside facility was also done during this visit.  Signed, AWende Bushy MD  02/22/2016 4:36 PM    [facility] This note was generated in part with voice recognition software and I apologize for any  typographical errors that were not detected and corrected.

## 2016-02-22 ENCOUNTER — Telehealth: Payer: Self-pay | Admitting: *Deleted

## 2016-02-22 ENCOUNTER — Encounter: Payer: Self-pay | Admitting: Cardiology

## 2016-02-22 ENCOUNTER — Other Ambulatory Visit: Payer: Self-pay | Admitting: *Deleted

## 2016-02-22 ENCOUNTER — Ambulatory Visit (INDEPENDENT_AMBULATORY_CARE_PROVIDER_SITE_OTHER): Payer: Medicare Other | Admitting: Cardiology

## 2016-02-22 VITALS — BP 100/56 | HR 68 | Ht 64.0 in | Wt 140.5 lb

## 2016-02-22 DIAGNOSIS — I2510 Atherosclerotic heart disease of native coronary artery without angina pectoris: Secondary | ICD-10-CM

## 2016-02-22 DIAGNOSIS — I4510 Unspecified right bundle-branch block: Secondary | ICD-10-CM | POA: Diagnosis not present

## 2016-02-22 DIAGNOSIS — I351 Nonrheumatic aortic (valve) insufficiency: Secondary | ICD-10-CM | POA: Diagnosis not present

## 2016-02-22 DIAGNOSIS — I4891 Unspecified atrial fibrillation: Secondary | ICD-10-CM

## 2016-02-22 DIAGNOSIS — I10 Essential (primary) hypertension: Secondary | ICD-10-CM

## 2016-02-22 DIAGNOSIS — I480 Paroxysmal atrial fibrillation: Secondary | ICD-10-CM

## 2016-02-22 MED ORDER — APIXABAN 5 MG PO TABS: 5.0000 mg | ORAL_TABLET | Freq: Two times a day (BID) | ORAL | 6 refills | Status: DC

## 2016-02-22 NOTE — Telephone Encounter (Signed)
-----   Message from Wende Bushy, MD sent at 02/22/2016  4:51 PM EDT ----- Can we please include a fasting lipid panel when he comes in for his CMP? Thank you very much

## 2016-02-22 NOTE — Patient Instructions (Addendum)
Medication Instructions:  Your physician has recommended you make the following change in your medication:  1. Eliquis 5 mg Twice daily.  Samples of this drug were given to the patient, quantity 28 tablets, Lot Number [id number] EXP SEP 2019   Labwork: Your physician recommends that you return for lab work in: [place] in 3 months.  Testing/Procedures: Your physician has requested that you have an echocardiogram. Echocardiography is a painless test that uses sound waves to create images of your heart. It provides your doctor with information about the size and shape of your heart and how well your heart's chambers and valves are working. This procedure takes approximately one hour. There are no restrictions for this procedure.  Follow-Up: Your physician recommends that you schedule a follow-up appointment in: 3 months with Dr. Yvone Neu.  It was a pleasure seeing you today here in the office. Please do not hesitate to give [place] a call back if you have any further questions. [place], BSN      Any Other Special Instructions Will Be Listed Below (If Applicable).     If you need a refill on your cardiac medications before your next appointment, please call your pharmacy.

## 2016-02-22 NOTE — Telephone Encounter (Signed)
Instructed patients daughter that we added on lipid profile when he returns for labs and to make sure he does not eat or drink after midnight but that he can take his medications with a small sip of water. She verbalized understanding and had no further questions at this time.

## 2016-02-23 DIAGNOSIS — M542 Cervicalgia: Secondary | ICD-10-CM | POA: Diagnosis not present

## 2016-02-23 DIAGNOSIS — M545 Low back pain: Secondary | ICD-10-CM | POA: Diagnosis not present

## 2016-02-24 DIAGNOSIS — M542 Cervicalgia: Secondary | ICD-10-CM | POA: Diagnosis not present

## 2016-02-24 DIAGNOSIS — M545 Low back pain: Secondary | ICD-10-CM | POA: Diagnosis not present

## 2016-02-29 ENCOUNTER — Telehealth: Payer: Self-pay | Admitting: Cardiology

## 2016-02-29 NOTE — Telephone Encounter (Signed)
Left message for pt to be released in carelink.

## 2016-03-02 ENCOUNTER — Ambulatory Visit (INDEPENDENT_AMBULATORY_CARE_PROVIDER_SITE_OTHER): Payer: Medicare Other | Admitting: *Deleted

## 2016-03-02 DIAGNOSIS — I480 Paroxysmal atrial fibrillation: Secondary | ICD-10-CM | POA: Diagnosis not present

## 2016-03-02 NOTE — Progress Notes (Signed)
Carelink Summary Report / Loop Recorder 

## 2016-03-13 DIAGNOSIS — H2513 Age-related nuclear cataract, bilateral: Secondary | ICD-10-CM | POA: Diagnosis not present

## 2016-03-26 LAB — CUP PACEART REMOTE DEVICE CHECK: MDC IDC SESS DTM: 20170901050729

## 2016-04-02 ENCOUNTER — Ambulatory Visit (INDEPENDENT_AMBULATORY_CARE_PROVIDER_SITE_OTHER): Payer: Medicare Other | Admitting: *Deleted

## 2016-04-02 DIAGNOSIS — I480 Paroxysmal atrial fibrillation: Secondary | ICD-10-CM | POA: Diagnosis not present

## 2016-04-02 NOTE — Progress Notes (Signed)
Carelink Summary Report / Loop Recorder 

## 2016-04-03 DIAGNOSIS — K219 Gastro-esophageal reflux disease without esophagitis: Secondary | ICD-10-CM | POA: Diagnosis not present

## 2016-04-03 DIAGNOSIS — J383 Other diseases of vocal cords: Secondary | ICD-10-CM | POA: Diagnosis not present

## 2016-04-04 NOTE — Progress Notes (Signed)
Subjective:    Patient ID: Austin [name], male    DOB: 12/16/1930, 82 y.o.   MRN: [id number]  HPI Visit to review chronic health conditions Reviewed status with Luellen Pucker RN  Also needs medical clearance for direct laryngoscopy with biopsy but Dr Kathyrn Sheriff Still has raspy voice--his tenor vocals are really different Uncomfortable when swallowing Continues on ranitidine  Continues to work out daily Walks quickly [employer] training  No chest pain No dizziness or syncope No SOB or change in exercise tolerance No edema No palpitations  He has adjusted here well Socially getting along No depression  Current Outpatient Prescriptions on File Prior to Visit  Medication Sig Dispense Refill  . acetaminophen (TYLENOL) 500 MG tablet Take 1 tablet by mouth every 6 (six) hours as needed.    Marland Kitchen apixaban (ELIQUIS) 5 MG TABS tablet Take 1 tablet (5 mg total) by mouth 2 (two) times daily. 60 tablet 6  . aspirin EC 81 MG tablet Take 1 tablet by mouth daily.    . Calcium Carbonate Antacid (TUMS CHEWY BITES PO) Take by mouth.    . Cholecalciferol (VITAMIN D3) 2000 units capsule Take 1 capsule by mouth daily.     . citalopram (CELEXA) 10 MG tablet Take 10 mg by mouth daily.    . Coenzyme Q-10 200 MG CAPS Take 1 capsule by mouth.    . Cyanocobalamin (RA VITAMIN B-12 TR) 1000 MCG TBCR Take 1 tablet by mouth daily.    Marland Kitchen docusate sodium (COLACE) 100 MG capsule Take 1 capsule by mouth at bedtime.    . donepezil (ARICEPT) 10 MG tablet Take 1 tablet by mouth at bedtime.    . Magnesium Oxide 400 (240 Mg) MG TABS Take 1 tablet by mouth 2 (two) times daily.    . Misc Natural Products (CURCUMAX PRO PO) Take 300 mg by mouth daily.    . Multiple Vitamins-Minerals (CENTRUM SILVER PO) Take 1 tablet by mouth.    . ranitidine (ZANTAC) 150 MG tablet Take 1 tablet by mouth at bedtime.    Marland Kitchen telmisartan (MICARDIS) 40 MG tablet Take 40 mg by mouth 2 (two) times daily at 10 AM and 5 PM.     No current  facility-administered medications on file prior to visit.     Allergies  Allergen Reactions  . Amiodarone Other (See Comments)    Trouble waking/falls  . Multaq [Dronedarone] Other (See Comments)    unknown    Past Medical History:  Diagnosis Date  . Alzheimer's dementia   . Atrial fibrillation ([place])   . Coronary atherosclerosis of native coronary artery    MI 2014  . Gout   . History of prostate cancer   . HTN (hypertension)   . LPRD (laryngopharyngeal reflux disease)   . Osteoarthritis, generalized    DJD neck and spine  . RBBB   . RBBB (right bundle branch block)     Past Surgical History:  Procedure Laterality Date  . ABDOMINAL ADHESION SURGERY    . APPENDECTOMY    . EP IMPLANTABLE DEVICE     Reveal device Medtronic Serial [id number]  Model [id number]  . INGUINAL HERNIA REPAIR Left   . PROSTATECTOMY    . SHOULDER ARTHROSCOPY    . TREATMENT FISTULA ANAL      Family History  Problem Relation Age of Onset  . Hypertension Mother   . Cancer Neg Hx   . Heart disease Neg Hx   . Diabetes Neg Hx     Social  History   Social History  . Marital status: Widowed    Spouse name: N/A  . Number of children: 3  . Years of education: N/A   Occupational History  . 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 Smoking status: Never Smoker  . Smokeless tobacco: Never Used  . Alcohol use Yes     Comment: 1 martini/ day  . Drug use: No  . Sexual activity: Not on file   Other Topics Concern  . Not on file   Social History Narrative   Has living will   All three children are health care POA   Would accept resuscitation attempts   No tube feeds if cognitively unaware   Review of Systems Appetite is good Likes the food Weight is stable Sleeps well Bowels are fine No urinary problems    Objective:   Physical Exam  Constitutional: He appears well-developed and well-nourished. No distress.  Neck: Normal range of motion. Neck  supple. No thyromegaly present.  Cardiovascular: Normal rate, regular rhythm and normal heart sounds.  Exam reveals no gallop.   No murmur heard. Pulmonary/Chest: Effort normal and breath sounds normal. No respiratory distress. He has no wheezes. He has no rales.  Abdominal: Soft. There is no tenderness.  Musculoskeletal: He exhibits no edema.  Lymphadenopathy:    He has no cervical adenopathy.  Neurological:  Repeats himself frequently  Psychiatric: He has a normal mood and affect. His behavior is normal.  Mood seems good          Assessment & Plan:

## 2016-04-05 ENCOUNTER — Non-Acute Institutional Stay: Payer: Medicare Other | Admitting: Internal Medicine

## 2016-04-05 ENCOUNTER — Emergency Department
Admission: EM | Admit: 2016-04-05 | Discharge: 2016-04-06 | Disposition: A | Discharge status: Home / Self Care | Payer: Medicare Other | Attending: Emergency Medicine | Admitting: Emergency Medicine

## 2016-04-05 ENCOUNTER — Encounter: Payer: Self-pay | Admitting: Internal Medicine

## 2016-04-05 ENCOUNTER — Encounter: Payer: Self-pay | Admitting: *Deleted

## 2016-04-05 VITALS — BP 122/55 | HR 68 | Wt 145.6 lb

## 2016-04-05 DIAGNOSIS — G301 Alzheimer's disease with late onset: Secondary | ICD-10-CM

## 2016-04-05 DIAGNOSIS — I10 Essential (primary) hypertension: Secondary | ICD-10-CM | POA: Diagnosis not present

## 2016-04-05 DIAGNOSIS — K219 Gastro-esophageal reflux disease without esophagitis: Secondary | ICD-10-CM | POA: Diagnosis not present

## 2016-04-05 DIAGNOSIS — N3090 Cystitis, unspecified without hematuria: Secondary | ICD-10-CM

## 2016-04-05 DIAGNOSIS — Z8546 Personal history of malignant neoplasm of prostate: Secondary | ICD-10-CM | POA: Diagnosis not present

## 2016-04-05 DIAGNOSIS — I2510 Atherosclerotic heart disease of native coronary artery without angina pectoris: Secondary | ICD-10-CM | POA: Insufficient documentation

## 2016-04-05 DIAGNOSIS — I480 Paroxysmal atrial fibrillation: Secondary | ICD-10-CM | POA: Diagnosis not present

## 2016-04-05 DIAGNOSIS — F39 Unspecified mood [affective] disorder: Secondary | ICD-10-CM

## 2016-04-05 DIAGNOSIS — G309 Alzheimer's disease, unspecified: Secondary | ICD-10-CM | POA: Diagnosis not present

## 2016-04-05 DIAGNOSIS — Z7982 Long term (current) use of aspirin: Secondary | ICD-10-CM | POA: Insufficient documentation

## 2016-04-05 DIAGNOSIS — N3091 Cystitis, unspecified with hematuria: Secondary | ICD-10-CM | POA: Diagnosis not present

## 2016-04-05 DIAGNOSIS — Z79899 Other long term (current) drug therapy: Secondary | ICD-10-CM | POA: Diagnosis not present

## 2016-04-05 DIAGNOSIS — R310 Gross hematuria: Secondary | ICD-10-CM | POA: Diagnosis not present

## 2016-04-05 DIAGNOSIS — F0280 Dementia in other diseases classified elsewhere without behavioral disturbance: Secondary | ICD-10-CM

## 2016-04-05 DIAGNOSIS — R319 Hematuria, unspecified: Secondary | ICD-10-CM | POA: Diagnosis not present

## 2016-04-05 NOTE — Assessment & Plan Note (Signed)
Seems regular now On the eliquis---will hold for 2 days prior to biopsy

## 2016-04-05 NOTE — Assessment & Plan Note (Signed)
Has adjusted here well Enjoys the social atmosphere, food, etc Needs med supervision, meals, etc---but generally doing his ADLs

## 2016-04-05 NOTE — ED Triage Notes (Signed)
Pt reports hematuria x 2 today. Pt denies dysuria. Pt has hx of prostate problems but is unable to recall specifics. Pt denies n/v. Pt states occurred in evening. Pt resides in assisted living, but does not know specifically what meds he takes.

## 2016-04-05 NOTE — ED Notes (Signed)
Family at bedside. 

## 2016-04-05 NOTE — ED Provider Notes (Signed)
[facility] Emergency Department Provider Note   ____________________________________________   First MD Initiated Contact with Patient 04/05/16 2336     (approximate)  I have reviewed the triage vital signs and the nursing notes.   HISTORY  Chief Complaint Hematuria    HPI Austin [name] is a 82 y.o. male who presents to the ED from assisted living via EMS with a chief complaint of hematuria. Patient reports 2 episodes of hematuria this evening. Denies associated dysuria, abdominal or flank pain, fever, chills, nausea, vomiting, constipation or diarrhea.Denies chest pain or shortness of breath. Takes Eliquis for atrial fibrillation. Has never had hematuria before, although states he has had "prostate problems". Denies recent increase or adjustment in his anticoagulant. Denies recent travel or trauma. Nothing makes his symptoms better or worse.   Past Medical History:  Diagnosis Date  . Alzheimer's dementia   . Atrial fibrillation ([place])   . Coronary atherosclerosis of native coronary artery    MI 2014  . Gout   . History of prostate cancer   . HTN (hypertension)   . LPRD (laryngopharyngeal reflux disease)   . Osteoarthritis, generalized    DJD neck and spine  . RBBB   . RBBB (right bundle branch block)     Patient Active Problem List   Diagnosis Date Noted  . Mood disorder ([place]) 01/12/2016  . Coronary atherosclerosis of native coronary artery   . LPRD (laryngopharyngeal reflux disease)   . HTN (hypertension)   . History of prostate cancer   . Atrial fibrillation ([place])   . Osteoarthritis, generalized   . Gout   . Alzheimer's dementia     Past Surgical History:  Procedure Laterality Date  . ABDOMINAL ADHESION SURGERY    . APPENDECTOMY    . EP IMPLANTABLE DEVICE     Reveal device Medtronic Serial [id number]  Model [id number]  . INGUINAL HERNIA REPAIR Left   . PROSTATECTOMY    . SHOULDER ARTHROSCOPY    . TREATMENT FISTULA ANAL       Prior to Admission medications   Medication Sig Start Date End Date Taking? Authorizing Provider  acetaminophen (TYLENOL) 500 MG tablet Take 1 tablet by mouth every 6 (six) hours as needed. 09/21/15  Yes Historical Provider, MD  apixaban (ELIQUIS) 5 MG TABS tablet Take 1 tablet (5 mg total) by mouth 2 (two) times daily. 02/22/16  Yes Wende Bushy, MD  aspirin EC 81 MG tablet Take 1 tablet by mouth daily.   Yes Historical Provider, MD  calcium carbonate (TITRALAC) 420 MG CHEW chewable tablet Chew 420 mg by mouth daily.   Yes Historical Provider, MD  Cholecalciferol (VITAMIN D3) 2000 units capsule Take 1 capsule by mouth daily.    Yes Historical Provider, MD  citalopram (CELEXA) 10 MG tablet Take 10 mg by mouth daily.   Yes Historical Provider, MD  docusate sodium (COLACE) 100 MG capsule Take 1 capsule by mouth at bedtime.   Yes Historical Provider, MD  donepezil (ARICEPT) 10 MG tablet Take 1 tablet by mouth at bedtime.   Yes Historical Provider, MD  Magnesium Oxide 400 (240 Mg) MG TABS Take 1 tablet by mouth 2 (two) times daily. 08/19/15  Yes Historical Provider, MD  Misc Natural Products (CURCUMAX PRO PO) Take 300 mg by mouth daily.   Yes Historical Provider, MD  Multiple Vitamin (MULTIVITAMIN WITH MINERALS) TABS tablet Take 1 tablet by mouth daily.   Yes Historical Provider, MD  Naproxen Sodium (ALEVE) 220 MG CAPS Take 1  capsule by mouth daily.   Yes Historical Provider, MD  ranitidine (ZANTAC) 150 MG tablet Take 1 tablet by mouth at bedtime.   Yes Historical Provider, MD  telmisartan (MICARDIS) 40 MG tablet Take 40 mg by mouth 2 (two) times daily at 10 AM and 5 PM.   Yes Historical Provider, MD  vitamin B-12 (CYANOCOBALAMIN) 1000 MCG tablet Take 1,000 mcg by mouth daily.   Yes Historical Provider, MD  cephALEXin (KEFLEX) 500 MG capsule Take 1 capsule (500 mg total) by mouth 3 (three) times daily. 04/06/16   Paulette Blanch, MD    Allergies Amiodarone and Multaq [dronedarone]  Family History   Problem Relation Age of Onset  . Hypertension Mother   . Cancer Neg Hx   . Heart disease Neg Hx   . Diabetes Neg Hx     Social History Social History  Substance Use Topics  . Smoking status: Never Smoker  . Smokeless tobacco: Never Used  . Alcohol use Yes     Comment: 1 martini/ day    Review of Systems  Constitutional: No fever/chills. Eyes: No visual changes. ENT: No sore throat. Cardiovascular: Denies chest pain. Respiratory: Denies shortness of breath. Gastrointestinal: No abdominal pain.  No nausea, no vomiting.  No diarrhea.  No constipation. Genitourinary: Positive for hematuria. Negative for dysuria. Musculoskeletal: Negative for back pain. Skin: Negative for rash. Neurological: Negative for headaches, focal weakness or numbness.  10-point ROS otherwise negative.  ____________________________________________   PHYSICAL EXAM:  VITAL SIGNS: ED Triage Vitals [04/05/16 2320]  Enc Vitals Group     BP (!) 171/78     Pulse Rate 69     Resp 18     Temp 98.7 F (37.1 C)     Temp src      SpO2 97 %     Weight 144 lb (65.3 kg)     Height 5\' 8"  (1.727 m)     Head Circumference      Peak Flow      Pain Score      Pain Loc      Pain Edu?      Excl. in [place]?     Constitutional: Alert and oriented. Well appearing and in no acute distress. Eyes: Conjunctivae are normal. PERRL. EOMI. Head: Atraumatic. Nose: No congestion/rhinnorhea. Mouth/Throat: Mucous membranes are moist.  Oropharynx non-erythematous. Neck: No stridor.   Cardiovascular: Normal rate, regular rhythm. Grossly normal heart sounds.  Good peripheral circulation. Respiratory: Normal respiratory effort.  No retractions. Lungs CTAB. Gastrointestinal: Soft and nontender to light or deep palpation. No distention. No abdominal bruits. No CVA tenderness. Musculoskeletal: No lower extremity tenderness nor edema.  No joint effusions. Neurologic:  Normal speech and language. No gross focal neurologic  deficits are appreciated. No gait instability. Skin:  Skin is warm, dry and intact. No rash noted. Psychiatric: Mood and affect are normal. Speech and behavior are normal.  ____________________________________________   LABS (all labs ordered are listed, but only abnormal results are displayed)  Labs Reviewed  CBC WITH DIFFERENTIAL/PLATELET - Abnormal; Notable for the following:       Result Value   RBC 4.26 (*)    HCT 38.4 (*)    RDW 15.0 (*)    Platelets 131 (*)    All other components within normal limits  BASIC METABOLIC PANEL - Abnormal; Notable for the following:    Glucose, Bld 101 (*)    BUN 28 (*)    All other components within normal limits  PROTIME-INR - Abnormal; Notable for the following:    Prothrombin Time 16.1 (*)    All other components within normal limits  URINALYSIS COMPLETEWITH MICROSCOPIC ([facility] ONLY) - Abnormal; Notable for the following:    Color, Urine RED (*)    APPearance CLOUDY (*)    Glucose, UA   (*)    Value: TEST NOT REPORTED DUE TO COLOR INTERFERENCE OF URINE PIGMENT   Bilirubin Urine   (*)    Value: TEST NOT REPORTED DUE TO COLOR INTERFERENCE OF URINE PIGMENT   Ketones, ur   (*)    Value: TEST NOT REPORTED DUE TO COLOR INTERFERENCE OF URINE PIGMENT   Hgb urine dipstick   (*)    Value: TEST NOT REPORTED DUE TO COLOR INTERFERENCE OF URINE PIGMENT   Protein, ur   (*)    Value: TEST NOT REPORTED DUE TO COLOR INTERFERENCE OF URINE PIGMENT   Nitrite   (*)    Value: TEST NOT REPORTED DUE TO COLOR INTERFERENCE OF URINE PIGMENT   Leukocytes, UA   (*)    Value: TEST NOT REPORTED DUE TO COLOR INTERFERENCE OF URINE PIGMENT   All other components within normal limits  URINE CULTURE   ____________________________________________  EKG  ED ECG REPORT I, Haleigh Desmith J, the attending physician, personally viewed and interpreted this ECG.   Date: 04/05/2016  EKG Time: 2347  Rate: 64  Rhythm: normal EKG, normal sinus rhythm  Axis: Normal   Intervals:right bundle branch block  ST&T Change: Nonspecific  ____________________________________________  RADIOLOGY  None ____________________________________________   PROCEDURES  Procedure(s) performed: None  Procedures  Critical Care performed: No  ____________________________________________   INITIAL IMPRESSION / ASSESSMENT AND PLAN / ED COURSE  Pertinent labs & imaging results that were available during my care of the patient were reviewed by me and considered in my medical decision making (see chart for details).  82 year old male on Eliquis who presents with hematuria. No complaints of pain. Will obtain screening lab work, urinalysis and reassess.  Clinical Course  Comment By Time  Updated patient and daughter on laboratory and urinalysis results. Administer IV Rocephin here and convert to Keflex outpatient. I have advised patient to hold his Eliquis x 2 days. Strict return precautions given. Both verbalize understanding and agree with plan of care. Paulette Blanch, MD 10/06 0150     ____________________________________________   FINAL CLINICAL IMPRESSION(S) / ED DIAGNOSES  Final diagnoses:  Gross hematuria  Cystitis      NEW MEDICATIONS STARTED DURING THIS VISIT:  Discharge Medication List as of 04/06/2016  2:29 AM    START taking these medications   Details  cephALEXin (KEFLEX) 500 MG capsule Take 1 capsule (500 mg total) by mouth 3 (three) times daily., Starting Fri 04/06/2016, Print         Note:  This document was prepared using Dragon voice recognition software and may include unintentional dictation errors.    Paulette Blanch, MD 04/06/16 [phone]

## 2016-04-05 NOTE — Assessment & Plan Note (Addendum)
No chest pain or other symptoms Recent EKG just shows RBBB Okay to proceed with laryngeal biopsy

## 2016-04-05 NOTE — Assessment & Plan Note (Signed)
Due for biopsy by Dr Kathyrn Sheriff No medical contraindications to the procedure---okay to proceed Will instruct about holding the eliquis

## 2016-04-05 NOTE — Assessment & Plan Note (Signed)
Doing well here If no problems with biopsy, and continues to do well, will consider weaning citalopram at next visit

## 2016-04-06 DIAGNOSIS — N3091 Cystitis, unspecified with hematuria: Secondary | ICD-10-CM | POA: Diagnosis not present

## 2016-04-06 LAB — CBC WITH DIFFERENTIAL/PLATELET
BASOS PCT: 1 %
Basophils Absolute: 0.1 10*3/uL (ref 0–0.1)
Eosinophils Absolute: 0.2 10*3/uL (ref 0–0.7)
Eosinophils Relative: 4 %
HEMATOCRIT: 38.4 % — AB (ref 40.0–52.0)
HEMOGLOBIN: 13.1 g/dL (ref 13.0–18.0)
LYMPHS ABS: 2.4 10*3/uL (ref 1.0–3.6)
Lymphocytes Relative: 37 %
MCH: 30.7 pg (ref 26.0–34.0)
MCHC: 34 g/dL (ref 32.0–36.0)
MCV: 90.2 fL (ref 80.0–100.0)
MONO ABS: 0.8 10*3/uL (ref 0.2–1.0)
MONOS PCT: 12 %
NEUTROS ABS: 3 10*3/uL (ref 1.4–6.5)
Neutrophils Relative %: 46 %
Platelets: 131 10*3/uL — ABNORMAL LOW (ref 150–440)
RBC: 4.26 MIL/uL — ABNORMAL LOW (ref 4.40–5.90)
RDW: 15 % — AB (ref 11.5–14.5)
WBC: 6.5 10*3/uL (ref 3.8–10.6)

## 2016-04-06 LAB — BASIC METABOLIC PANEL
Anion gap: 6 (ref 5–15)
BUN: 28 mg/dL — AB (ref 6–20)
CALCIUM: 9.6 mg/dL (ref 8.9–10.3)
CHLORIDE: 103 mmol/L (ref 101–111)
CO2: 29 mmol/L (ref 22–32)
CREATININE: 1 mg/dL (ref 0.61–1.24)
GFR calc Af Amer: >60 mL/min (ref >60)
GFR calc non Af Amer: >60 mL/min (ref >60)
GLUCOSE: 101 mg/dL — AB (ref 65–99)
Potassium: 4.1 mmol/L (ref 3.5–5.1)
Sodium: 138 mmol/L (ref 135–145)

## 2016-04-06 LAB — URINALYSIS COMPLETE WITH MICROSCOPIC (ARMC ONLY)
Bacteria, UA: NONE SEEN
SPECIFIC GRAVITY, URINE: 1.02 (ref 1.005–1.030)
SQUAMOUS EPITHELIAL / LPF: NONE SEEN

## 2016-04-06 LAB — PROTIME-INR
INR: 1.28
Prothrombin Time: 16.1 seconds — ABNORMAL HIGH (ref 11.4–15.2)

## 2016-04-06 MED ORDER — DEXTROSE 5 % IV SOLN
1.0000 g | INTRAVENOUS | Status: DC
  Administered 2016-04-06: 1 g via INTRAVENOUS
  Filled 2016-04-06: qty 10

## 2016-04-06 MED ORDER — CEPHALEXIN 500 MG PO CAPS: 500.0000 mg | ORAL_CAPSULE | Freq: Three times a day (TID) | ORAL | 0 refills | Status: DC

## 2016-04-06 NOTE — Discharge Instructions (Signed)
1. Take antibiotic as prescribed (Keflex 500 mg 3 times daily 7 days). 2. Do not take Eliquis Friday or Saturday. You may resume your normal dose of Eliquis on Sunday. 3. Return to the ER for worsening symptoms, persistent vomiting, fever, feeling faint or other concerns.

## 2016-04-07 LAB — URINE CULTURE
Culture: NO GROWTH
Special Requests: NORMAL

## 2016-04-11 ENCOUNTER — Encounter: Payer: Self-pay | Admitting: Urology

## 2016-04-11 ENCOUNTER — Ambulatory Visit (INDEPENDENT_AMBULATORY_CARE_PROVIDER_SITE_OTHER): Payer: Medicare Other | Admitting: Urology

## 2016-04-11 VITALS — Ht 63.5 in | Wt 144.1 lb

## 2016-04-11 DIAGNOSIS — Z8546 Personal history of malignant neoplasm of prostate: Secondary | ICD-10-CM

## 2016-04-11 DIAGNOSIS — I2510 Atherosclerotic heart disease of native coronary artery without angina pectoris: Secondary | ICD-10-CM

## 2016-04-11 DIAGNOSIS — R310 Gross hematuria: Secondary | ICD-10-CM

## 2016-04-11 LAB — URINALYSIS, COMPLETE
Bilirubin, UA: NEGATIVE
NITRITE UA: POSITIVE — AB
Specific Gravity, UA: <1.005 — ABNORMAL LOW (ref 1.005–1.030)
Urobilinogen, Ur: >8 mg/dL — ABNORMAL HIGH (ref 0.2–1.0)
pH, UA: >9 — ABNORMAL HIGH (ref 5.0–7.5)

## 2016-04-11 LAB — BLADDER SCAN AMB NON-IMAGING: Scan Result: 12

## 2016-04-11 LAB — MICROSCOPIC EXAMINATION: Epithelial Cells (non renal): NONE SEEN /hpf (ref 0–10)

## 2016-04-11 MED ORDER — LIDOCAINE HCL 2 % EX GEL: 1.0000 "application " | Freq: Once | CUTANEOUS | Status: DC

## 2016-04-11 MED ORDER — CIPROFLOXACIN HCL 500 MG PO TABS: 500.0000 mg | ORAL_TABLET | Freq: Once | ORAL | Status: DC

## 2016-04-11 NOTE — Progress Notes (Signed)
04/11/2016 9:13 AM   Austin [name] 12/15/1930 [id number]  Referring provider: Venia Carbon, MD [place], [place] [address]  Chief Complaint  Patient presents with  . New Patient (Initial Visit)    microscopic hematuria    HPI: The patient is an 82 year old gentleman with a past medical history of prostate cancer status post prostatectomy and atrial fibrillation on eliquis presents today for EGD follow-up from for gross hematuria.  This started approximately one week ago. He has had gross hematuria without clots. He feels like his bladder is empty. This is confirmed a bladder scan today. He has never smoked. Has no other urinary complaints at this time. He is never worked with dye or hair chemicals.  Review of his pathology from September 2003 showed Gleason 3+3 = 6 prostate cancer in 5% of the prostate. Margins were negative. The disease was confined to the gland.  Last PSA available to me was from August 2004 which was undetectable.  PMH: Past Medical History:  Diagnosis Date  . Alzheimer's dementia   . Atrial fibrillation ([place])   . Coronary atherosclerosis of native coronary artery    MI 2014  . Gout   . History of prostate cancer   . HTN (hypertension)   . LPRD (laryngopharyngeal reflux disease)   . Osteoarthritis, generalized    DJD neck and spine  . RBBB   . RBBB (right bundle branch block)     Surgical History: Past Surgical History:  Procedure Laterality Date  . ABDOMINAL ADHESION SURGERY    . APPENDECTOMY    . EP IMPLANTABLE DEVICE     Reveal device Medtronic Serial [id number]  Model [id number]  . INGUINAL HERNIA REPAIR Left   . KNEE SURGERY    . PROSTATECTOMY    . SHOULDER ARTHROSCOPY    . TREATMENT FISTULA ANAL      Home Medications:    Medication List       Accurate as of 04/11/16  9:13 AM. Always use your most recent med list.          acetaminophen 500 MG tablet Commonly known as:  TYLENOL Take 1 tablet by mouth  every 6 (six) hours as needed.   ALEVE 220 MG Caps Generic drug:  Naproxen Sodium Take 1 capsule by mouth daily.   apixaban 5 MG Tabs tablet Commonly known as:  ELIQUIS Take 1 tablet (5 mg total) by mouth 2 (two) times daily.   aspirin EC 81 MG tablet Take 1 tablet by mouth daily.   calcium carbonate 420 MG Chew chewable tablet Commonly known as:  TITRALAC Chew 420 mg by mouth daily.   cephALEXin 500 MG capsule Commonly known as:  KEFLEX Take 1 capsule (500 mg total) by mouth 3 (three) times daily.   citalopram 10 MG tablet Commonly known as:  CELEXA Take 10 mg by mouth daily.   CURCUMAX PRO PO Take 300 mg by mouth daily.   docusate sodium 100 MG capsule Commonly known as:  COLACE Take 1 capsule by mouth at bedtime.   donepezil 10 MG tablet Commonly known as:  ARICEPT Take 1 tablet by mouth at bedtime.   Magnesium Oxide 400 (240 Mg) MG Tabs Take 1 tablet by mouth 2 (two) times daily.   multivitamin with minerals Tabs tablet Take 1 tablet by mouth daily.   ranitidine 150 MG tablet Commonly known as:  ZANTAC Take 1 tablet by mouth at bedtime.   telmisartan 40 MG tablet Commonly known as:  MICARDIS Take 40 mg by mouth 2 (two) times daily at 10 AM and 5 PM.   vitamin B-12 1000 MCG tablet Commonly known as:  CYANOCOBALAMIN Take 1,000 mcg by mouth daily.   Vitamin D3 2000 units capsule Take 1 capsule by mouth daily.       Allergies:  Allergies  Allergen Reactions  . Amiodarone Other (See Comments)    Trouble waking/falls  . Multaq [Dronedarone] Other (See Comments)    unknown  . Albuterol Palpitations    Family History: Family History  Problem Relation Age of Onset  . Hypertension Mother   . Chronic Renal Failure Mother   . Cancer Neg Hx   . Heart disease Neg Hx   . Diabetes Neg Hx   . Prostate cancer Neg Hx     Social History:  reports that he has never smoked. He has never used smokeless tobacco. He reports that he drinks alcohol. He  reports that he does not use drugs.  ROS:                                        Physical Exam: Ht 5' 3.5" (1.613 m)   Wt 144 lb 1.6 oz (65.4 kg)   BMI 25.13 kg/m   Constitutional:  Alert and oriented, No acute distress. HEENT: [place] AT, moist mucus membranes.  Trachea midline, no masses. Cardiovascular: No clubbing, cyanosis, or edema. Respiratory: Normal respiratory effort, no increased work of breathing. GI: Abdomen is soft, nontender, nondistended, no abdominal masses GU: No CVA tenderness. Skin: No rashes, bruises or suspicious lesions. Lymph: No cervical or inguinal adenopathy. Neurologic: Grossly intact, no focal deficits, moving all 4 extremities. Psychiatric: Normal mood and affect.  Laboratory Data: Lab Results  Component Value Date   WBC 6.5 04/06/2016   HGB 13.1 04/06/2016   HCT 38.4 (L) 04/06/2016   MCV 90.2 04/06/2016   PLT 131 (L) 04/06/2016    Lab Results  Component Value Date   CREATININE 1.00 04/06/2016    No results found for: PSA  No results found for: TESTOSTERONE  No results found for: HGBA1C  Urinalysis    Component Value Date/Time   COLORURINE RED (A) 04/06/2016 0000   APPEARANCEUR CLOUDY (A) 04/06/2016 0000   LABSPEC 1.020 04/06/2016 0000   PHURINE  04/06/2016 0000    TEST NOT REPORTED DUE TO COLOR INTERFERENCE OF URINE PIGMENT   GLUCOSEU (A) 04/06/2016 0000    TEST NOT REPORTED DUE TO COLOR INTERFERENCE OF URINE PIGMENT   HGBUR (A) 04/06/2016 0000    TEST NOT REPORTED DUE TO COLOR INTERFERENCE OF URINE PIGMENT   BILIRUBINUR (A) 04/06/2016 0000    TEST NOT REPORTED DUE TO COLOR INTERFERENCE OF URINE PIGMENT   KETONESUR (A) 04/06/2016 0000    TEST NOT REPORTED DUE TO COLOR INTERFERENCE OF URINE PIGMENT   PROTEINUR (A) 04/06/2016 0000    TEST NOT REPORTED DUE TO COLOR INTERFERENCE OF URINE PIGMENT   NITRITE (A) 04/06/2016 0000    TEST NOT REPORTED DUE TO COLOR INTERFERENCE OF URINE PIGMENT   LEUKOCYTESUR (A)  04/06/2016 0000    TEST NOT REPORTED DUE TO COLOR INTERFERENCE OF URINE PIGMENT     Assessment & Plan:    1. Gross hematuria Discussed the standard workup with the patient and his daughter. He will undergo a CT urogram followed by office cystoscopy. We'll check a BMP in preparation for his  contrast dye load from the CT scan. Next  2. History of prostate cancer Check PSA. Have very low suspicion for recurrence given his pathology at time of prostatectomy.   Return for cystoscopy after CT.  Nickie Retort, MD  [facility] [address] [place], [place] [address] [phone]

## 2016-04-12 LAB — BASIC METABOLIC PANEL
BUN/Creatinine Ratio: 20 (ref 10–24)
BUN: 18 mg/dL (ref 8–27)
CALCIUM: 9.9 mg/dL (ref 8.6–10.2)
CHLORIDE: 100 mmol/L (ref 96–106)
CO2: 24 mmol/L (ref 18–29)
Creatinine, Ser: 0.88 mg/dL (ref 0.76–1.27)
GFR calc Af Amer: 92 mL/min/{1.73_m2} (ref >59)
GFR calc non Af Amer: 79 mL/min/{1.73_m2} (ref >59)
Glucose: 102 mg/dL — ABNORMAL HIGH (ref 65–99)
POTASSIUM: 4.2 mmol/L (ref 3.5–5.2)
Sodium: 144 mmol/L (ref 134–144)

## 2016-04-12 LAB — PSA: Prostate Specific Ag, Serum: <0.1 ng/mL (ref 0.0–4.0)

## 2016-04-13 ENCOUNTER — Telehealth: Payer: Self-pay

## 2016-04-13 NOTE — Telephone Encounter (Signed)
Nickie Retort, MD  Lestine Box, LPN        Please let patient know that PSA is undetectable. No sign of cancer. Follow as scheduled after CT scan for cystoscopy. Thanks.    LMOM

## 2016-04-13 NOTE — Telephone Encounter (Signed)
Spoke with pt and daughter in reference to PSA, CT, and cysto. Pt voiced understanding.  Daughter stated they have not heard about the CT scan. Can you help?

## 2016-04-16 NOTE — Discharge Instructions (Signed)

## 2016-04-17 NOTE — Telephone Encounter (Signed)
Ct is schd for the 24th and the ct results and cysto are schd for the 25th The patient is aware. i'm not sure why the daughter is not.  Austin [name]

## 2016-04-19 ENCOUNTER — Ambulatory Visit
Admission: RE | Admit: 2016-04-19 | Discharge: 2016-04-19 | Disposition: A | Discharge status: Home / Self Care | Payer: Medicare Other | Source: Ambulatory Visit | Attending: Otolaryngology | Admitting: Otolaryngology

## 2016-04-19 ENCOUNTER — Ambulatory Visit: Payer: Medicare Other | Admitting: Anesthesiology

## 2016-04-19 ENCOUNTER — Encounter
Admission: RE | Disposition: A | Discharge status: Home / Self Care | Payer: Self-pay | Source: Ambulatory Visit | Attending: Otolaryngology

## 2016-04-19 DIAGNOSIS — I4891 Unspecified atrial fibrillation: Secondary | ICD-10-CM | POA: Insufficient documentation

## 2016-04-19 DIAGNOSIS — Z7982 Long term (current) use of aspirin: Secondary | ICD-10-CM | POA: Diagnosis not present

## 2016-04-19 DIAGNOSIS — Z7901 Long term (current) use of anticoagulants: Secondary | ICD-10-CM | POA: Insufficient documentation

## 2016-04-19 DIAGNOSIS — I10 Essential (primary) hypertension: Secondary | ICD-10-CM | POA: Diagnosis not present

## 2016-04-19 DIAGNOSIS — I252 Old myocardial infarction: Secondary | ICD-10-CM | POA: Diagnosis not present

## 2016-04-19 DIAGNOSIS — I2510 Atherosclerotic heart disease of native coronary artery without angina pectoris: Secondary | ICD-10-CM | POA: Diagnosis not present

## 2016-04-19 DIAGNOSIS — Z79899 Other long term (current) drug therapy: Secondary | ICD-10-CM | POA: Diagnosis not present

## 2016-04-19 DIAGNOSIS — K219 Gastro-esophageal reflux disease without esophagitis: Secondary | ICD-10-CM | POA: Insufficient documentation

## 2016-04-19 DIAGNOSIS — J383 Other diseases of vocal cords: Secondary | ICD-10-CM | POA: Insufficient documentation

## 2016-04-19 HISTORY — DX: Gastro-esophageal reflux disease without esophagitis: K21.9

## 2016-04-19 HISTORY — PX: DIRECT LARYNGOSCOPY: SHX5326

## 2016-04-19 SURGERY — Surgical Case
Anesthesia: *Unknown

## 2016-04-19 SURGERY — LARYNGOSCOPY, DIRECT
Anesthesia: General | Laterality: Left | Wound class: Clean Contaminated

## 2016-04-19 MED ORDER — FENTANYL CITRATE (PF) 100 MCG/2ML IJ SOLN
INTRAMUSCULAR | Status: DC | PRN
  Administered 2016-04-19: 50 ug via INTRAVENOUS
  Administered 2016-04-19: 25 ug via INTRAVENOUS

## 2016-04-19 MED ORDER — SUCCINYLCHOLINE CHLORIDE 20 MG/ML IJ SOLN
INTRAMUSCULAR | Status: DC | PRN
  Administered 2016-04-19: 80 mg via INTRAVENOUS

## 2016-04-19 MED ORDER — MIDAZOLAM HCL 5 MG/5ML IJ SOLN
INTRAMUSCULAR | Status: DC | PRN
  Administered 2016-04-19: 1 mg via INTRAVENOUS

## 2016-04-19 MED ORDER — ACETAMINOPHEN 160 MG/5ML PO SOLN: 325.0000 mg | ORAL | Status: DC | PRN

## 2016-04-19 MED ORDER — PHENYLEPHRINE HCL 0.5 % NA SOLN
NASAL | Status: DC | PRN
  Administered 2016-04-19: 09:00:00

## 2016-04-19 MED ORDER — PROPOFOL 10 MG/ML IV BOLUS
INTRAVENOUS | Status: DC | PRN
  Administered 2016-04-19: 150 mg via INTRAVENOUS

## 2016-04-19 MED ORDER — OXYCODONE HCL 5 MG/5ML PO SOLN: 5.0000 mg | Freq: Once | ORAL | Status: DC | PRN

## 2016-04-19 MED ORDER — LIDOCAINE HCL (CARDIAC) 20 MG/ML IV SOLN
INTRAVENOUS | Status: DC | PRN
  Administered 2016-04-19: 40 mg via INTRAVENOUS

## 2016-04-19 MED ORDER — HYDROMORPHONE HCL 1 MG/ML IJ SOLN: 0.2500 mg | INTRAMUSCULAR | Status: DC | PRN

## 2016-04-19 MED ORDER — ONDANSETRON HCL 4 MG/2ML IJ SOLN
4.0000 mg | Freq: Once | INTRAMUSCULAR | Status: AC | PRN
  Administered 2016-04-19: 4 mg via INTRAVENOUS

## 2016-04-19 MED ORDER — ACETAMINOPHEN 325 MG PO TABS
325.0000 mg | ORAL_TABLET | ORAL | Status: DC | PRN
  Administered 2016-04-19: 650 mg via ORAL

## 2016-04-19 MED ORDER — OXYCODONE HCL 5 MG PO TABS: 5.0000 mg | ORAL_TABLET | Freq: Once | ORAL | Status: DC | PRN

## 2016-04-19 MED ORDER — DEXAMETHASONE SODIUM PHOSPHATE 4 MG/ML IJ SOLN
INTRAMUSCULAR | Status: DC | PRN
  Administered 2016-04-19: 4 mg via INTRAVENOUS

## 2016-04-19 MED ORDER — EPHEDRINE SULFATE 50 MG/ML IJ SOLN
INTRAMUSCULAR | Status: DC | PRN
  Administered 2016-04-19: 10 mg via INTRAVENOUS

## 2016-04-19 MED ORDER — LACTATED RINGERS IV SOLN
INTRAVENOUS | Status: DC
  Administered 2016-04-19 (×2): via INTRAVENOUS

## 2016-04-19 MED ORDER — GLYCOPYRROLATE 0.2 MG/ML IJ SOLN
INTRAMUSCULAR | Status: DC | PRN
  Administered 2016-04-19: 0.1 mg via INTRAVENOUS
  Administered 2016-04-19: 0.4 mg via INTRAVENOUS

## 2016-04-19 SURGICAL SUPPLY — 22 items
BASIN GRAD PLASTIC 32OZ STRL (MISCELLANEOUS) ×3 IMPLANT
BLOCK BITE GUARD (MISCELLANEOUS) ×3 IMPLANT
COVER MAYO STAND STRL (DRAPES) ×3 IMPLANT
COVER TABLE BACK 60X90 (DRAPES) ×3 IMPLANT
CUP MEDICINE 2OZ PLAST GRAD ST (MISCELLANEOUS) ×3 IMPLANT
DRAPE SHEET LG 3/4 BI-LAMINATE (DRAPES) ×3 IMPLANT
DRESSING TELFA 4X3 1S ST N-ADH (GAUZE/BANDAGES/DRESSINGS) ×3 IMPLANT
GLOVE PI ULTRA LF STRL 7.5 (GLOVE) ×1 IMPLANT
GLOVE PI ULTRA NON LATEX 7.5 (GLOVE) ×2
KIT ROOM TURNOVER OR (KITS) ×3 IMPLANT
MARKER SKIN DUAL TIP RULER LAB (MISCELLANEOUS) ×3 IMPLANT
NEEDLE 18GX1X1/2 (RX/OR ONLY) (NEEDLE) ×3 IMPLANT
NEEDLE FILTER BLUNT 18X 1/2SAF (NEEDLE)
NEEDLE FILTER BLUNT 18X1 1/2 (NEEDLE) IMPLANT
NS IRRIG 500ML POUR BTL (IV SOLUTION) ×3 IMPLANT
PATTIES SURGICAL .5 X.5 (GAUZE/BANDAGES/DRESSINGS) ×3 IMPLANT
SPONGE XRAY 4X4 16PLY STRL (MISCELLANEOUS) ×3 IMPLANT
STRAP BODY AND KNEE 60X3 (MISCELLANEOUS) ×3 IMPLANT
SYRINGE 10CC LL (SYRINGE) IMPLANT
TOWEL OR 17X26 4PK STRL BLUE (TOWEL DISPOSABLE) ×3 IMPLANT
TUBING CONN 6MMX3.1M (TUBING) ×2
TUBING SUCTION CONN 0.25 STRL (TUBING) ×1 IMPLANT

## 2016-04-19 NOTE — Anesthesia Postprocedure Evaluation (Signed)
Anesthesia Post Note  Patient: Austin [name]  Procedure(s) Performed: Procedure(s) (LRB): DIRECT MICROSCOPIC LARYNGOSCOPY WITH BIOPSY (Left)  Patient location during evaluation: PACU Anesthesia Type: General Level of consciousness: awake and alert Pain management: pain level controlled Vital Signs Assessment: post-procedure vital signs reviewed and stable Respiratory status: spontaneous breathing, nonlabored ventilation and respiratory function stable Cardiovascular status: blood pressure returned to baseline and stable Postop Assessment: no signs of nausea or vomiting Anesthetic complications: no    Trecia Rogers

## 2016-04-19 NOTE — Transfer of Care (Signed)
Immediate Anesthesia Transfer of Care Note  Patient: Austin [name]  Procedure(s) Performed: Procedure(s): DIRECT MICROSCOPIC LARYNGOSCOPY WITH BIOPSY (Left)  Patient Location: PACU  Anesthesia Type: General  Level of Consciousness: awake, alert  and patient cooperative  Airway and Oxygen Therapy: Patient Spontanous Breathing and Patient connected to supplemental oxygen  Post-op Assessment: Post-op Vital signs reviewed, Patient's Cardiovascular Status Stable, Respiratory Function Stable, Patent Airway and No signs of Nausea or vomiting  Post-op Vital Signs: Reviewed and stable  Complications: No apparent anesthesia complications

## 2016-04-19 NOTE — Anesthesia Procedure Notes (Signed)
Procedure Name: Intubation Date/Time: 04/19/2016 8:20 AM Performed by: Mayme Genta Pre-anesthesia Checklist: Patient identified, Emergency Drugs available, Suction available, Patient being monitored and Timeout performed Patient Re-evaluated:Patient Re-evaluated prior to inductionOxygen Delivery Method: Circle system utilized Preoxygenation: Pre-oxygenation with 100% oxygen Intubation Type: IV induction Ventilation: Mask ventilation without difficulty Laryngoscope Size: Miller and 3 Grade View: Grade I Tube type: MLT Tube size: 6.0 mm Number of attempts: 1 Placement Confirmation: ETT inserted through vocal cords under direct vision,  positive ETCO2 and breath sounds checked- equal and bilateral Tube secured with: Tape Dental Injury: Teeth and Oropharynx as per pre-operative assessment

## 2016-04-19 NOTE — Op Note (Signed)
04/19/2016  8:50 AM    [name]  [id number]   Pre-Op Dx:  Left vocal cord lesion  Post-op Dx: Same  Proc: Direct microlaryngoscopy and biopsy of left vocal cord lesion   Surg:  Jakaila Norment H  Anes:  GOT  EBL:  5 mL  Comp:  None  Findings:  White roundish mass just behind the left vocal process and adjacent to the arytenoid. Seems to be coming from inside the posterior true cord  Procedure: The patient was given general anesthesia by oral endotracheal intubation using a size 6 endotracheal tube with a large cuff. Once patient was asleep tooth guard was used on the upper teeth and a Dedo laryngoscope was used for visualizing the true cords. Once I could see the cords are Louis arm was brought in for stabilization. The 0 endoscope was used for magnification and visualization on the video screen. A whitish smooth growth was noted posteriorly on the left cord just behind the vocal process. This was soft and squishy. A picture was taken. Cup biting forceps were used to then grasp this and remove this. It broke into different pieces and multiple small pieces were removed as well. It seemed to come from the true cord and was down on the muscle of the cord. I did biopsies of this to remove all of the tissue that I could see. I used cottonoid pledgets soaked in phenylephrine and Xylocaine for vasoconstriction.  there is almost no blood loss. The rest the larynx looks normal. Subglottic space is clear. The epiglottis vallecula and piriform sinuses are all clear.  Patient was awakened and taken to the recovery room in satisfactory condition. There were no operative complications.  Dispo:   To PACU to be discharged home  Plan:  To follow-up in the office in 5 days for reevaluation and go over the path report. He will be on voice rest at home. He can use Tylenol for pain as he should not have any significant pain. He can eat a regular diet as tolerated. He is to notify us if he has any  challenges postop.  Hawkins Seaman H  04/19/2016 8:50 AM

## 2016-04-19 NOTE — Anesthesia Preprocedure Evaluation (Signed)
Anesthesia Evaluation  Patient identified by MRN, date of birth, ID band Patient awake    Reviewed: Allergy & Precautions, H&P , NPO status , Patient's Chart, lab work & pertinent test results, reviewed documented beta blocker date and time   Airway Mallampati: I  TM Distance: >3 FB Neck ROM: full    Dental no notable dental hx.    Pulmonary neg pulmonary ROS,    Pulmonary exam normal breath sounds clear to auscultation       Cardiovascular Exercise Tolerance: Good hypertension, + CAD  + dysrhythmias Atrial Fibrillation (-) pacemaker Rhythm:regular Rate:Normal  rhythm detection device    Neuro/Psych PSYCHIATRIC DISORDERS dementianegative neurological ROS     GI/Hepatic Neg liver ROS, GERD  ,  Endo/Other  negative endocrine ROS  Renal/GU negative Renal ROS  negative genitourinary   Musculoskeletal   Abdominal   Peds  Hematology negative hematology ROS (+)   Anesthesia Other Findings   Reproductive/Obstetrics negative OB ROS                             Anesthesia Physical Anesthesia Plan  ASA: III  Anesthesia Plan: General   Post-op Pain Management:    Induction:   Airway Management Planned:   Additional Equipment:   Intra-op Plan:   Post-operative Plan:   Informed Consent: I have reviewed the patients History and Physical, chart, labs and discussed the procedure including the risks, benefits and alternatives for the proposed anesthesia with the patient or authorized representative who has indicated his/her understanding and acceptance.   Dental Advisory Given  Plan Discussed with: CRNA and Anesthesiologist  Anesthesia Plan Comments:         Anesthesia Quick Evaluation

## 2016-04-19 NOTE — H&P (Signed)
  H&P has been reviewed and no changes necessary. To be downloaded later. 

## 2016-04-20 ENCOUNTER — Encounter: Payer: Self-pay | Admitting: Otolaryngology

## 2016-04-23 LAB — SURGICAL PATHOLOGY

## 2016-04-24 ENCOUNTER — Ambulatory Visit
Admission: RE | Admit: 2016-04-24 | Discharge: 2016-04-24 | Disposition: A | Discharge status: Home / Self Care | Payer: Medicare Other | Source: Ambulatory Visit | Attending: Urology | Admitting: Urology

## 2016-04-24 DIAGNOSIS — N200 Calculus of kidney: Secondary | ICD-10-CM | POA: Diagnosis not present

## 2016-04-24 DIAGNOSIS — N3289 Other specified disorders of bladder: Secondary | ICD-10-CM | POA: Diagnosis not present

## 2016-04-24 DIAGNOSIS — N281 Cyst of kidney, acquired: Secondary | ICD-10-CM | POA: Insufficient documentation

## 2016-04-24 DIAGNOSIS — Z9079 Acquired absence of other genital organ(s): Secondary | ICD-10-CM | POA: Diagnosis not present

## 2016-04-24 DIAGNOSIS — R310 Gross hematuria: Secondary | ICD-10-CM | POA: Diagnosis not present

## 2016-04-24 DIAGNOSIS — J383 Other diseases of vocal cords: Secondary | ICD-10-CM | POA: Diagnosis not present

## 2016-04-24 MED ORDER — IOPAMIDOL (ISOVUE-300) INJECTION 61%
125.0000 mL | Freq: Once | INTRAVENOUS | Status: AC | PRN
  Administered 2016-04-24: 125 mL via INTRAVENOUS

## 2016-04-25 ENCOUNTER — Ambulatory Visit (INDEPENDENT_AMBULATORY_CARE_PROVIDER_SITE_OTHER): Payer: Medicare Other | Admitting: Urology

## 2016-04-25 ENCOUNTER — Encounter: Payer: Self-pay | Admitting: Urology

## 2016-04-25 VITALS — BP 127/74 | HR 71 | Ht 68.0 in | Wt 145.2 lb

## 2016-04-25 DIAGNOSIS — I2510 Atherosclerotic heart disease of native coronary artery without angina pectoris: Secondary | ICD-10-CM

## 2016-04-25 DIAGNOSIS — N281 Cyst of kidney, acquired: Secondary | ICD-10-CM

## 2016-04-25 DIAGNOSIS — R310 Gross hematuria: Secondary | ICD-10-CM

## 2016-04-25 LAB — URINALYSIS, COMPLETE
BILIRUBIN UA: NEGATIVE
Glucose, UA: NEGATIVE
Ketones, UA: NEGATIVE
LEUKOCYTES UA: NEGATIVE
Nitrite, UA: NEGATIVE
PH UA: 6 (ref 5.0–7.5)
PROTEIN UA: NEGATIVE
Specific Gravity, UA: 1.025 (ref 1.005–1.030)
Urobilinogen, Ur: 0.2 mg/dL (ref 0.2–1.0)

## 2016-04-25 LAB — MICROSCOPIC EXAMINATION
Bacteria, UA: NONE SEEN
EPITHELIAL CELLS (NON RENAL): NONE SEEN /HPF (ref 0–10)

## 2016-04-25 MED ORDER — LIDOCAINE HCL 2 % EX GEL
1.0000 "application " | Freq: Once | CUTANEOUS | Status: AC
  Administered 2016-04-25: 1 via URETHRAL

## 2016-04-25 MED ORDER — CIPROFLOXACIN HCL 500 MG PO TABS
500.0000 mg | ORAL_TABLET | Freq: Once | ORAL | Status: AC
  Administered 2016-04-25: 500 mg via ORAL

## 2016-04-25 NOTE — Progress Notes (Signed)
.     04/25/16  CC:  Chief Complaint  Patient presents with  . Cysto    gross hematuria     HPI: Pt here to review CT, discuss renal cysts and for cystoscopy for microhematuria. CT was done 04/2016 and was benign but there was a left Bosniak II cyst which will need repeat imaging in 1 year.   H/o RRP for PCa. PSA was <0.01 Apr 2016.   Blood pressure 127/74, pulse 71, height 5\' 8"  (1.727 m), weight 65.9 kg (145 lb 3.2 oz). NED. A&Ox3.   No respiratory distress   Abd soft, NT, ND Normal phallus with bilateral descended testicles  Cystoscopy Procedure Note  Patient identification was confirmed, informed consent was obtained, and patient was prepped using Betadine solution.  Lidocaine jelly was administered per urethral meatus.    Preoperative abx where received prior to procedure.     Pre-Procedure: - Inspection reveals a normal caliber ureteral meatus.  Procedure: The flexible cystoscope was introduced without difficulty - No urethral strictures/lesions are present. - Prostate surgically absent  - Normal bladder neck - Bilateral ureteral orifices identified - Bladder mucosa  reveals no ulcers, tumors, foreign bodies or lesions - No bladder stones - No trabeculation  Retroflexion shows normal bladder neck, anterior.     Post-Procedure: - Patient tolerated the procedure well  Assessment/ Plan: Discussed with patient and daughter:  1) bosniak 1 and II cysts - u/s in 1 year  2) MH - benign evaluation

## 2016-05-01 ENCOUNTER — Ambulatory Visit (INDEPENDENT_AMBULATORY_CARE_PROVIDER_SITE_OTHER): Payer: Medicare Other | Admitting: *Deleted

## 2016-05-01 DIAGNOSIS — I480 Paroxysmal atrial fibrillation: Secondary | ICD-10-CM | POA: Diagnosis not present

## 2016-05-01 DIAGNOSIS — Z23 Encounter for immunization: Secondary | ICD-10-CM | POA: Diagnosis not present

## 2016-05-02 NOTE — Progress Notes (Signed)
Carelink Summary Report / Loop Recorder 

## 2016-05-09 ENCOUNTER — Other Ambulatory Visit: Payer: Self-pay

## 2016-05-12 LAB — CUP PACEART REMOTE DEVICE CHECK: MDC IDC SESS DTM: 20171001053949

## 2016-05-12 NOTE — Progress Notes (Signed)
Carelink summary report received. Battery status OK. Normal device function. No new symptom episodes, tachy episodes, brady, or pause episodes. No new AF episodes. Monthly summary reports and ROV/PRN 

## 2016-05-15 ENCOUNTER — Other Ambulatory Visit: Payer: Self-pay

## 2016-05-15 ENCOUNTER — Ambulatory Visit (INDEPENDENT_AMBULATORY_CARE_PROVIDER_SITE_OTHER): Payer: Medicare Other

## 2016-05-15 ENCOUNTER — Other Ambulatory Visit (INDEPENDENT_AMBULATORY_CARE_PROVIDER_SITE_OTHER): Payer: Medicare Other | Admitting: *Deleted

## 2016-05-15 DIAGNOSIS — I351 Nonrheumatic aortic (valve) insufficiency: Secondary | ICD-10-CM | POA: Diagnosis not present

## 2016-05-15 DIAGNOSIS — I480 Paroxysmal atrial fibrillation: Secondary | ICD-10-CM

## 2016-05-15 DIAGNOSIS — R490 Dysphonia: Secondary | ICD-10-CM | POA: Diagnosis not present

## 2016-05-15 DIAGNOSIS — I10 Essential (primary) hypertension: Secondary | ICD-10-CM | POA: Diagnosis not present

## 2016-05-15 DIAGNOSIS — I4891 Unspecified atrial fibrillation: Secondary | ICD-10-CM | POA: Diagnosis not present

## 2016-05-15 DIAGNOSIS — K219 Gastro-esophageal reflux disease without esophagitis: Secondary | ICD-10-CM | POA: Diagnosis not present

## 2016-05-16 LAB — COMPREHENSIVE METABOLIC PANEL
ALK PHOS: 72 IU/L (ref 39–117)
ALT: 15 IU/L (ref 0–44)
AST: 23 IU/L (ref 0–40)
Albumin/Globulin Ratio: 2 (ref 1.2–2.2)
Albumin: 4.5 g/dL (ref 3.5–4.7)
BUN / CREAT RATIO: 22 (ref 10–24)
BUN: 20 mg/dL (ref 8–27)
Bilirubin Total: 0.5 mg/dL (ref 0.0–1.2)
CHLORIDE: 100 mmol/L (ref 96–106)
CO2: 28 mmol/L (ref 18–29)
Calcium: 10.4 mg/dL — ABNORMAL HIGH (ref 8.6–10.2)
Creatinine, Ser: 0.9 mg/dL (ref 0.76–1.27)
GFR, EST AFRICAN AMERICAN: 91 mL/min/{1.73_m2} (ref >59)
GFR, EST NON AFRICAN AMERICAN: 79 mL/min/{1.73_m2} (ref >59)
GLUCOSE: 100 mg/dL — AB (ref 65–99)
Globulin, Total: 2.3 g/dL (ref 1.5–4.5)
POTASSIUM: 4.7 mmol/L (ref 3.5–5.2)
SODIUM: 144 mmol/L (ref 134–144)
Total Protein: 6.8 g/dL (ref 6.0–8.5)

## 2016-05-16 LAB — LIPID PANEL
CHOL/HDL RATIO: 3.4 ratio (ref 0.0–5.0)
Cholesterol, Total: 178 mg/dL (ref 100–199)
HDL: 53 mg/dL (ref >39)
LDL Calculated: 104 mg/dL — ABNORMAL HIGH (ref 0–99)
Triglycerides: 106 mg/dL (ref 0–149)
VLDL Cholesterol Cal: 21 mg/dL (ref 5–40)

## 2016-05-30 ENCOUNTER — Encounter: Payer: Self-pay | Admitting: Cardiology

## 2016-05-30 ENCOUNTER — Ambulatory Visit (INDEPENDENT_AMBULATORY_CARE_PROVIDER_SITE_OTHER): Payer: Medicare Other | Admitting: Cardiology

## 2016-05-30 VITALS — BP 112/62 | HR 60 | Ht 63.0 in | Wt 148.2 lb

## 2016-05-30 DIAGNOSIS — I10 Essential (primary) hypertension: Secondary | ICD-10-CM

## 2016-05-30 DIAGNOSIS — I480 Paroxysmal atrial fibrillation: Secondary | ICD-10-CM

## 2016-05-30 DIAGNOSIS — I4510 Unspecified right bundle-branch block: Secondary | ICD-10-CM | POA: Diagnosis not present

## 2016-05-30 DIAGNOSIS — I2510 Atherosclerotic heart disease of native coronary artery without angina pectoris: Secondary | ICD-10-CM

## 2016-05-30 DIAGNOSIS — I351 Nonrheumatic aortic (valve) insufficiency: Secondary | ICD-10-CM | POA: Diagnosis not present

## 2016-05-30 NOTE — Progress Notes (Signed)
Cardiology Office Note   Date:  05/30/2016   ID:  Austin [name], DOB [birth year], MRN [id number]  Referring Doctor:  Austin Simpler, MD   Cardiologist:   Austin Bushy, MD   Reason for consultation:  Chief Complaint  Patient presents with  . other    80mof/u echo and labs. Pt states he is dong well. Reviewed meds with pt verbally.      History of Present Illness: Austin [name] a 82y.o. male who presents for Follow-up for paroxysmal atrial fibrillation   Review of medical records: Patient used to see a Dr. PPosey Prontoin [name] cardiology care. He has a diagnosis of paroxysmal atrial fibrillation, right bundle branch block, and hypertension. In terms of the atrial fibrillation, per patient and daughter reports, and review of extensive medical records, it appears that this may been diagnosed sometime in July 2014. He was placed on Eliquis at that time. A 30 day monitor did not reveal any recurrence of atrial fibrillation and hence eliquis was discontinued. Patient did not recall any intolerance or side effects from eliquis. He then had a reveal loop recorder device placed at that time.  In 08/10/2015, he had a recurrence of atrial fibrillation while he was in the hospital, which was thought to be related after use of an albuterol inhaler. He had cardioversions done at that time. He was also subsequently placed on amiodarone. However, he developed subsequent gait instability and falls. These were thought to be related to amiodarone use and therefore amiodarone was eventually discontinued.  I since last visit, patient has been doing quite well. He continues to be physically active exercising at least 4 times a week. Exercise consists of walking, calisthenics, light weight lifting. No chest pain, shortness of breath, palpitations, syncope. No recent falls. No gait instability. No bleeding issues.   ROS:  Please see the history of present illness. Aside from mentioned under HPI, all  other systems are reviewed and negative.     Past Medical History:  Diagnosis Date  . Alzheimer's dementia    [place]  . Atrial fibrillation ([facility]    REVEAL DEVICE MEDTRONIC SERIAL ##[id number] MODEL ##[id number] . Coronary atherosclerosis of native coronary artery    MI 2014  . GERD (gastroesophageal reflux disease)    REFLUX  . Gout   . History of prostate cancer   . HTN (hypertension)   . LPRD (laryngopharyngeal reflux disease)   . Osteoarthritis, generalized    DJD neck and spine  . RBBB   . RBBB (right bundle branch block)     Past Surgical History:  Procedure Laterality Date  . ABDOMINAL ADHESION SURGERY    . APPENDECTOMY    . DIRECT LARYNGOSCOPY Left 04/19/2016   Procedure: DIRECT MICROSCOPIC LARYNGOSCOPY WITH BIOPSY;  Surgeon: PMargaretha Sheffield MD;  [facility] [facility]  Service: ENT;  Laterality: Left;  . EP IMPLANTABLE DEVICE     Reveal device Medtronic Serial #[id number]  Model #[id number] . INGUINAL HERNIA REPAIR Left   . KNEE SURGERY    . PROSTATECTOMY    . SHOULDER ARTHROSCOPY    . TREATMENT FISTULA ANAL       reports that he has never smoked. He has never used smokeless tobacco. He reports that he drinks alcohol. He reports that he does not use drugs.   family history includes Austin [name] in his mother; Hypertension in his mother.   Outpatient Medications Prior to Visit  Medication Sig Dispense Refill  .  acetaminophen (TYLENOL) 500 MG tablet Take 1 tablet by mouth every 6 (six) hours as needed.    Marland Kitchen apixaban (ELIQUIS) 5 MG TABS tablet Take 1 tablet (5 mg total) by mouth 2 (two) times daily. (Patient taking differently: Take 5 mg by mouth 2 (two) times daily. ) 60 tablet 6  . aspirin EC 81 MG tablet Take 1 tablet by mouth daily.    . calcium carbonate (TUMS EX) 750 MG chewable tablet Chew 1 tablet by mouth daily.    . Cholecalciferol (VITAMIN D3) 2000 units capsule Take 1 capsule by mouth daily.     . citalopram (CELEXA) 10 MG tablet Take 10 mg  by mouth daily. 9 PM    . Coenzyme Q10 (COQ10) 200 MG CAPS Take by mouth.    . docusate sodium (COLACE) 100 MG capsule Take 1 capsule by mouth at bedtime.    . donepezil (ARICEPT) 10 MG tablet Take 1 tablet by mouth at bedtime.    . Magnesium Oxide 400 (240 Mg) MG TABS Take 1 tablet by mouth 2 (two) times daily.    . Misc Natural Products (CURCUMAX PRO PO) Take 300 mg by mouth daily.    . Multiple Vitamin (MULTIVITAMIN WITH MINERALS) TABS tablet Take 1 tablet by mouth daily.    . Naproxen Sodium (ALEVE) 220 MG CAPS Take 1 capsule by mouth daily.    . ranitidine (ZANTAC) 150 MG tablet Take 1 tablet by mouth at bedtime.    Marland Kitchen telmisartan (MICARDIS) 40 MG tablet Take 40 mg by mouth 2 (two) times daily at 10 AM and 5 PM.    . vitamin B-12 (CYANOCOBALAMIN) 1000 MCG tablet Take 1,000 mcg by mouth daily.    . cephALEXin (KEFLEX) 500 MG capsule Take 1 capsule (500 mg total) by mouth 3 (three) times daily. (Patient not taking: Reported on 05/30/2016) 21 capsule 0   No facility-administered medications prior to visit.      Allergies: Amiodarone; Multaq [dronedarone]; and Albuterol    PHYSICAL EXAM: VS:  BP 112/62 (BP Location: Left Arm, Patient Position: Sitting, Cuff Size: Normal)   Pulse 60   Ht _0  (1.6 m)   Wt 148 lb 4 oz (67.2 kg)   BMI 26.26 kg/m  , Body mass index is 26.26 kg/m. Wt Readings from Last 3 Encounters:  05/30/16 148 lb 4 oz (67.2 kg)  04/25/16 145 lb 3.2 oz (65.9 kg)  04/19/16 142 lb (64.4 kg)    GENERAL:  well developed, well nourished, not in acute distress HEENT: normocephalic, pink conjunctivae, anicteric sclerae, no xanthelasma, normal dentition, oropharynx clear NECK:  no neck vein engorgement, JVP normal, no hepatojugular reflux, carotid upstroke brisk and symmetric, no bruit, no thyromegaly, no lymphadenopathy LUNGS:  good respiratory effort, clear to auscultation bilaterally CV:  PMI not displaced, no thrills, no lifts, S1 and S2 within normal limits, no  palpable S3 or S4, no murmurs, no rubs, no gallops ABD:  Soft, nontender, nondistended, normoactive bowel sounds, no abdominal aortic bruit, no hepatomegaly, no splenomegaly MS: nontender back, no kyphosis, no scoliosis, no joint deformities EXT:  2+ DP/PT pulses, no edema, no varicosities, no cyanosis, no clubbing SKIN: warm, nondiaphoretic, normal turgor, no ulcers NEUROPSYCH: alert, oriented to person, place, and time, sensory/motor grossly intact, normal mood, appropriate affect  Recent Labs: 04/06/2016: Hemoglobin 13.1; Platelets 131 05/15/2016: ALT 15; BUN 20; Creatinine, Ser 0.90; Potassium 4.7; Sodium 144   Lipid Panel    Component Value Date/Time   CHOL 178 05/15/2016 0857  TRIG 106 05/15/2016 0857   HDL 53 05/15/2016 0857   CHOLHDL 3.4 05/15/2016 0857   LDLCALC 104 (H) 05/15/2016 0857     Other studies Reviewed:  EKG:  The ekg from 02/22/2016 was personally reviewed by me and it revealed sinus rhythm, 60 BPM. Sinus arrhythmia. Right bundle branch block.  EKG from 05/30/2016 was personally reviewed by me and it revealed sinus rhythm 60 BPM. Right bundle branch block.  Additional studies/ records that were reviewed personally reviewed by me today include:  Exercise nuclear stress test 11/19/2012: Normal study. EF 65%.  Echo 11/17/2012: EF greater than 55%. Grade 1 diastolic dysfunction. Trace MR. Mild AI. PHT of 841.6 ms. RV systolic pressure is normal.  Holter monitor 10/30/2012:   asymptomatic bradycardia. Minimal heart rate 36 BPM during sleeping hours. Maximum heart rate 95 BPM. Average heart rate 59 BPM. Isolated PACs and PVCs, less than 3% burden. No sustained arrhythmias or conduction abnormalities. No symptoms reported.  Reveal device implanted 02/04/2013: November 2016: 1 AF episode lasted 20 minutes 08/19/2014: 2 episodes AF, longest one hour and 40 minutes.  Nuclear stress is 08/05/2015, [facility]: Normal cardiac perfusion imaging. EF 63%.  Echocardiogram  05/15/2016: Left ventricle: The cavity size was normal. There was mild focal   basal hypertrophy of the septum. Systolic function was normal.   The estimated ejection fraction was in the range of 50% to 55%.   Regional wall motion abnormalities cannot be excluded. Doppler   parameters are consistent with abnormal left ventricular   relaxation (grade 1 diastolic dysfunction). - Aortic valve: Trileaflet; mildly thickened leaflets. There was   mild regurgitation. - Left atrium: The atrium was mildly dilated.   ASSESSMENT AND PLAN: Atrial fibrillation Likely paroxysmal. Currently in sinus rhythm. Does not appear to be symptomatic from change in rhythm. He had significant intolerance to amiodarone. CHADS2-VASc= 3 Continue Eliquis. Continue with device clinic follow-up.  Right bundle branch block Known from previous EKGs No evidence of ischemia from recent stress testing February 2017.   Hypertension BP is well controlled. Continue monitoring BP. Continue current medical therapy and lifestyle changes.  History of CAD Per patient and daughter, he was told he had a mild heart attack LVEF is within normal limits. No complaints of angina or shortness of breath. Continue medical therapy with low-dose aspirin. Monitor for bleeding. LDL above 70. Discussed treatment options of intensifying lifestyle changes versus stent therapy. Patient opted to do lifestyle changes first. Repeat lipid panel and CMP in 3 months time.  Aortic insufficiency Mild No appreciable murmur. Continue serial evaluation.  Current medicines are reviewed at length with the patient today.  The patient does not have concerns regarding medicines.  Labs/ tests ordered today include:  Orders Placed This Encounter  Procedures  . Lipid Profile  . Hepatic function panel  . Comp Met (CMET)  . EKG 12-Lead    I had a lengthy and detailed discussion with the patient regarding diagnoses, prognosis, diagnostic options,  treatment options , and side effects of medications.   I counseled the patient on importance of lifestyle modification including heart healthy diet, regular physical activity .   Disposition:   FU with undersigned In 6 months    Signed, Austin Bushy, MD  05/30/2016 12:09 PM    [place]  This note was generated in part with voice recognition software and I apologize for any typographical errors that were not detected and corrected.

## 2016-05-30 NOTE — Patient Instructions (Addendum)
Labwork: Your physician recommends that you return for lab work in: 3 months for fasting lipid, liver, and complete metabolic panel. Make sure to not eat or drink anything after midnight before coming in for these labs. You may take any medications with a small sip of water.   Follow-Up: Your physician wants you to follow-up in: 6 months with Dr. Yvone [name]. You will receive a reminder letter in the mail two months in advance. If you don't receive a letter, please call our office to schedule the follow-up appointment.  It was a pleasure seeing you today here in the office. Please do not hesitate to give [place] a call back if you have any further questions. [place], BSN     Heart-Healthy Eating Plan Introduction Heart-healthy meal planning includes:  Limiting unhealthy fats.  Increasing healthy fats.  Making other small dietary changes. You may need to talk with your doctor or a diet specialist (dietitian) to create an eating plan that is right for you. What types of fat should I choose?  Choose healthy fats. These include olive oil and canola oil, flaxseeds, walnuts, almonds, and seeds.  Eat more omega-3 fats. These include salmon, mackerel, sardines, tuna, flaxseed oil, and ground flaxseeds. Try to eat fish at least twice each week.  Limit saturated fats.  Saturated fats are often found in animal products, such as meats, butter, and cream.  Plant sources of saturated fats include palm oil, palm kernel oil, and coconut oil.  Avoid foods with partially hydrogenated oils in them. These include stick margarine, some tub margarines, cookies, crackers, and other baked goods. These contain trans fats. What general guidelines do I need to follow?  Check food labels carefully. Identify foods with trans fats or high amounts of saturated fat.  Fill one half of your plate with vegetables and green salads. Eat 4-5 servings of vegetables per day. A serving of vegetables is:  1  cup of raw leafy vegetables.   cup of raw or cooked cut-up vegetables.   cup of vegetable juice.  Fill one fourth of your plate with whole grains. Look for the word "whole" as the first word in the ingredient list.  Fill one fourth of your plate with lean protein foods.  Eat 4-5 servings of fruit per day. A serving of fruit is:  One medium whole fruit.   cup of dried fruit.   cup of fresh, frozen, or canned fruit.   cup of 100% fruit juice.  Eat more foods that contain soluble fiber. These include apples, broccoli, carrots, beans, peas, and barley. Try to get 20-30 g of fiber per day.  Eat more home-cooked food. Eat less restaurant, buffet, and fast food.  Limit or avoid alcohol.  Limit foods high in starch and sugar.  Avoid fried foods.  Avoid frying your food. Try baking, boiling, grilling, or broiling it instead. You can also reduce fat by:  Removing the skin from poultry.  Removing all visible fats from meats.  Skimming the fat off of stews, soups, and gravies before serving them.  Steaming vegetables in water or broth.  Lose weight if you are overweight.  Eat 4-5 servings of nuts, legumes, and seeds per week:  One serving of dried beans or legumes equals  cup after being cooked.  One serving of nuts equals 1 ounces.  One serving of seeds equals  ounce or one tablespoon.  You may need to keep track of how much salt or sodium you eat.  This is especially true if you have high blood pressure. Talk with your doctor or dietitian to get more information. What foods can I eat? Grains  Breads, including [place], white, pita, wheat, raisin, rye, oatmeal, and [place]. Tortillas that are neither fried nor made with lard or trans fat. Low-fat rolls, including hotdog and hamburger buns and English muffins. Biscuits. Muffins. Waffles. Pancakes. Light popcorn. Whole-grain cereals. Flatbread. Melba toast. Pretzels. Breadsticks. Rusks. Low-fat snacks. Low-fat crackers,  including oyster, saltine, matzo, graham, animal, and rye. Rice and pasta, including brown rice and pastas that are made with whole wheat. Vegetables  All vegetables. Fruits  All fruits, but limit coconut. Meats and Other Protein Sources  Lean, well-trimmed beef, veal, pork, and lamb. Chicken and [place] without skin. All fish and shellfish. Wild duck, rabbit, pheasant, and venison. Egg whites or low-cholesterol egg substitutes. Dried beans, peas, lentils, and tofu. Seeds and most nuts. Dairy  Low-fat or nonfat cheeses, including ricotta, string, and mozzarella. Skim or 1% milk that is liquid, powdered, or evaporated. Buttermilk that is made with low-fat milk. Nonfat or low-fat yogurt. Beverages  Mineral water. Diet carbonated beverages. Sweets and Desserts  Sherbets and fruit ices. Honey, jam, marmalade, jelly, and syrups. Meringues and gelatins. Pure sugar candy, such as hard candy, jelly beans, gumdrops, mints, marshmallows, and small amounts of dark chocolate. [employer] Eat all sweets and desserts in moderation. Fats and Oils  Nonhydrogenated (trans-free) margarines. Vegetable oils, including soybean, sesame, sunflower, olive, peanut, safflower, corn, canola, and cottonseed. Salad dressings or mayonnaise made with a vegetable oil. Limit added fats and oils that you use for cooking, baking, salads, and as spreads. Other  Cocoa powder. Coffee and tea. All seasonings and condiments. The items listed above may not be a complete list of recommended foods or beverages. Contact your dietitian for more options.  What foods are not recommended? Grains  Breads that are made with saturated or trans fats, oils, or whole milk. Croissants. Butter rolls. Cheese breads. Sweet rolls. Donuts. Buttered popcorn. Chow mein noodles. High-fat crackers, such as cheese or butter crackers. Meats and Other Protein Sources  Fatty meats, such as hotdogs, short ribs, sausage, spareribs, bacon, rib eye roast or  steak, and mutton. High-fat deli meats, such as salami and bologna. Caviar. Domestic duck and goose. Organ meats, such as kidney, liver, sweetbreads, and heart. Dairy  Cream, sour cream, cream cheese, and creamed cottage cheese. Whole-milk cheeses, including blue (bleu), Monterey Jack, [place], [place], American, [place], Swiss, cheddar, [place], and [place]. Whole or 2% milk that is liquid, evaporated, or condensed. Whole buttermilk. Cream sauce or high-fat cheese sauce. Yogurt that is made from whole milk. Beverages  Regular sodas and juice drinks with added sugar. Sweets and Desserts  Frosting. Pudding. Cookies. Cakes other than angel food cake. Candy that has milk chocolate or white chocolate, hydrogenated fat, butter, coconut, or unknown ingredients. Buttered syrups. Full-fat ice cream or ice cream drinks. Fats and Oils  Gravy that has suet, meat fat, or shortening. Cocoa butter, hydrogenated oils, palm oil, coconut oil, palm kernel oil. These can often be found in baked products, candy, fried foods, nondairy creamers, and whipped toppings. Solid fats and shortenings, including bacon fat, salt pork, lard, and butter. Nondairy cream substitutes, such as coffee creamers and sour cream substitutes. Salad dressings that are made of unknown oils, cheese, or sour cream. The items listed above may not be a complete list of foods and beverages to avoid. Contact your dietitian for more information.  This information is not intended to replace advice given to you by your health care provider. Make sure you discuss any questions you have with your health care provider. Document Released: 12/18/2011 Document Revised: 11/24/2015 Document Reviewed: 12/10/2013  2017 Elsevier

## 2016-05-31 ENCOUNTER — Ambulatory Visit (INDEPENDENT_AMBULATORY_CARE_PROVIDER_SITE_OTHER): Payer: Medicare Other | Admitting: *Deleted

## 2016-05-31 DIAGNOSIS — I480 Paroxysmal atrial fibrillation: Secondary | ICD-10-CM

## 2016-05-31 NOTE — Progress Notes (Signed)
Carelink Summary Report / Loop Recorder 

## 2016-06-06 ENCOUNTER — Ambulatory Visit: Payer: Medicare Other | Admitting: Internal Medicine

## 2016-06-09 LAB — CUP PACEART REMOTE DEVICE CHECK: MDC IDC SESS DTM: 20171031053742

## 2016-06-09 NOTE — Progress Notes (Signed)
Carelink summary report received. Battery status OK. Normal device function. No new symptom episodes, tachy episodes, brady, or pause episodes. No new AF episodes. Monthly summary reports and ROV/PRN 

## 2016-07-03 ENCOUNTER — Ambulatory Visit (INDEPENDENT_AMBULATORY_CARE_PROVIDER_SITE_OTHER): Payer: Medicare Other | Admitting: *Deleted

## 2016-07-03 DIAGNOSIS — I480 Paroxysmal atrial fibrillation: Secondary | ICD-10-CM

## 2016-07-04 NOTE — Progress Notes (Signed)
[facility] Summary Report 

## 2016-07-09 ENCOUNTER — Ambulatory Visit: Payer: Medicare Other | Admitting: Speech Pathology

## 2016-07-11 LAB — CUP PACEART REMOTE DEVICE CHECK
MDC IDC PG IMPLANT DT: 20140806
MDC IDC SESS DTM: 20171130115552

## 2016-07-16 ENCOUNTER — Ambulatory Visit: Payer: Medicare Other | Admitting: Speech Pathology

## 2016-07-18 ENCOUNTER — Ambulatory Visit: Payer: Medicare Other | Attending: Otolaryngology | Admitting: Speech Pathology

## 2016-07-23 ENCOUNTER — Ambulatory Visit: Payer: Medicare Other | Admitting: Speech Pathology

## 2016-07-25 ENCOUNTER — Ambulatory Visit: Payer: Medicare Other | Admitting: Speech Pathology

## 2016-07-30 ENCOUNTER — Ambulatory Visit (INDEPENDENT_AMBULATORY_CARE_PROVIDER_SITE_OTHER): Payer: Medicare Other | Admitting: *Deleted

## 2016-07-30 ENCOUNTER — Ambulatory Visit: Payer: Medicare Other | Admitting: Speech Pathology

## 2016-07-30 DIAGNOSIS — I480 Paroxysmal atrial fibrillation: Secondary | ICD-10-CM

## 2016-07-30 NOTE — Progress Notes (Signed)
Carelink Summary Report / Loop Recorder 

## 2016-08-01 ENCOUNTER — Ambulatory Visit: Payer: Medicare Other | Admitting: Speech Pathology

## 2016-08-02 ENCOUNTER — Non-Acute Institutional Stay: Payer: Medicare Other | Admitting: Internal Medicine

## 2016-08-02 ENCOUNTER — Encounter: Payer: Self-pay | Admitting: Internal Medicine

## 2016-08-02 VITALS — BP 136/78 | HR 80 | Temp 99.9°F | Resp 18 | Wt 151.0 lb

## 2016-08-02 DIAGNOSIS — G301 Alzheimer's disease with late onset: Secondary | ICD-10-CM

## 2016-08-02 DIAGNOSIS — I2510 Atherosclerotic heart disease of native coronary artery without angina pectoris: Secondary | ICD-10-CM

## 2016-08-02 DIAGNOSIS — K219 Gastro-esophageal reflux disease without esophagitis: Secondary | ICD-10-CM | POA: Diagnosis not present

## 2016-08-02 DIAGNOSIS — I480 Paroxysmal atrial fibrillation: Secondary | ICD-10-CM

## 2016-08-02 DIAGNOSIS — F0280 Dementia in other diseases classified elsewhere without behavioral disturbance: Secondary | ICD-10-CM

## 2016-08-02 DIAGNOSIS — F39 Unspecified mood [affective] disorder: Secondary | ICD-10-CM

## 2016-08-02 DIAGNOSIS — J029 Acute pharyngitis, unspecified: Secondary | ICD-10-CM

## 2016-08-02 NOTE — Assessment & Plan Note (Signed)
No cancer on recent laryngoscopy Continues on the ranitidine

## 2016-08-02 NOTE — Progress Notes (Signed)
Subjective:    Patient ID: Austin [name], male    DOB: April 24, 1931, 83 y.o.   MRN: [id number]  HPI Visit in assisted living for follow up of chronic health conditions Reviewed status with Austin Pucker RN  Started with neck ache yesterday---after outdoor walk Some frontal throbbing as well now Low grade temp this AM--- 99.9 Raspy voice--but no clear cut sore  Periodic cough--nothing worse lately No SOB and doesn't really feel sick  Continues [name] exercise regularly No chest pain No dizziness or syncope No palpitations  Laryngoscopy showed just granuloma on larynx No reflux symptoms or dysphagia Just has some trouble with voice with talking and especially singing  Mild memory issues continue Significant short term problems but doing well functionally Independent with ADLs  Mood is good No recent issues with depression or anxiety  Current Outpatient Prescriptions on File Prior [name] Visit  Medication Sig Dispense Refill  . acetaminophen (TYLENOL) 500 MG tablet Take 1 tablet by mouth every 6 (six) hours as needed.    Marland Kitchen apixaban (ELIQUIS) 5 MG TABS tablet Take 1 tablet (5 mg total) by mouth 2 (two) times daily. 60 tablet 6  . aspirin EC 81 MG tablet Take 1 tablet by mouth daily.    . calcium carbonate (TUMS EX) 750 MG chewable tablet Chew 2 tablets by mouth daily as needed.     . Cholecalciferol (VITAMIN D3) 2000 units capsule Take 1 capsule by mouth daily.     . citalopram (CELEXA) 10 MG tablet Take 10 mg by mouth daily. 9 PM    . Coenzyme Q10 (COQ10) 200 MG CAPS Take by mouth.    . docusate sodium (COLACE) 100 MG capsule Take 1 capsule by mouth at bedtime.    . donepezil (ARICEPT) 10 MG tablet Take 1 tablet by mouth at bedtime.    . Magnesium Oxide 400 (240 Mg) MG TABS Take 1 tablet by mouth 2 (two) times daily.    . Misc Natural Products (CURCUMAX PRO PO) Take 300 mg by mouth daily.    . Multiple Vitamin (MULTIVITAMIN WITH MINERALS) TABS tablet Take 1 tablet by mouth daily.    .  Naproxen Sodium (ALEVE) 220 MG CAPS Take 1 capsule by mouth daily.    . ranitidine (ZANTAC) 150 MG tablet Take 1 tablet by mouth at bedtime.    Marland Kitchen telmisartan (MICARDIS) 40 MG tablet Take 40 mg by mouth 2 (two) times daily at 10 AM and 5 PM.    . vitamin B-12 (CYANOCOBALAMIN) 1000 MCG tablet Take 1,000 mcg by mouth daily.     No current facility-administered medications on file prior [name] visit.     Allergies  Allergen Reactions  . Amiodarone Other (See Comments)    Trouble waking/falls  . Multaq [Dronedarone] Other (See Comments)    unknown  . Albuterol Palpitations    Past Medical History:  Diagnosis Date  . Alzheimer's dementia    ALZHEIMERS  . Atrial fibrillation [facility])    REVEAL DEVICE MEDTRONIC SERIAL [id number] MODEL [id number]  . Coronary atherosclerosis of native coronary artery    MI 2014  . GERD (gastroesophageal reflux disease)    REFLUX  . Gout   . History of prostate cancer   . HTN (hypertension)   . LPRD (laryngopharyngeal reflux disease)   . Osteoarthritis, generalized    DJD neck and spine  . RBBB   . RBBB (right bundle branch block)     Past Surgical History:  Procedure Laterality Date  . ABDOMINAL ADHESION  SURGERY    . APPENDECTOMY    . DIRECT LARYNGOSCOPY Left 04/19/2016   Procedure: DIRECT MICROSCOPIC LARYNGOSCOPY WITH BIOPSY;  Surgeon: Margaretha Sheffield, MD;  Location: [facility];  Service: ENT;  Laterality: Left;  . EP IMPLANTABLE DEVICE     Reveal device Medtronic Serial [id number]  Model [id number]  . INGUINAL HERNIA REPAIR Left   . KNEE SURGERY    . PROSTATECTOMY    . SHOULDER ARTHROSCOPY    . TREATMENT FISTULA ANAL      Family History  Problem Relation Age of Onset  . Hypertension Mother   . Chronic Renal Failure Mother   . Cancer Neg Hx   . Heart disease Neg Hx   . Diabetes Neg Hx   . Prostate cancer Neg Hx     Social History   Social History  . Marital status: Widowed    Spouse name: N/A  . Number of children: 3  . Years of  education: N/A   Occupational History  . 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 Smoking status: Never Smoker  . Smokeless tobacco: Never Used  . Alcohol use Yes     Comment: 1 martini/ day  . Drug use: No  . Sexual activity: Not on file   Other Topics Concern  . Not on file   Social History Narrative   Has living will   All three children are health care POA   Would accept resuscitation attempts   No tube feeds if cognitively unaware   Review of Systems Appetite is good Weight stable Bowels fine---now 2-3 times per day Voids okay--no recent hematuria    Objective:   Physical Exam  Constitutional: He appears well-developed and well-nourished. No distress.  HENT:  Mouth/Throat: Oropharynx is clear and moist. No oropharyngeal exudate.  Neck: Neck supple. No thyromegaly present.  Cardiovascular: Normal rate, regular rhythm and normal heart sounds.  Exam reveals no gallop.   No murmur heard. Pulmonary/Chest: Effort normal and breath sounds normal. No respiratory distress. He has no wheezes. He has no rales.  Abdominal: Soft. There is no tenderness.  Musculoskeletal: He exhibits no edema or tenderness.  Lymphadenopathy:    He has no cervical adenopathy.  Neurological:  Clear memory issues but normal interaction  Psychiatric: He has a normal mood and affect. His behavior is normal.          Assessment & Plan:

## 2016-08-02 NOTE — Assessment & Plan Note (Signed)
Seems to be better and has adjusted here well Will decrease the citalopram

## 2016-08-02 NOTE — Assessment & Plan Note (Signed)
Acute illness since last night Low grade fever, headache, neck aches Doesn't look to have the flu---probably just other URI Supportive care but keep him isolated till improved

## 2016-08-02 NOTE — Assessment & Plan Note (Signed)
Mild Repeats himself frequently but doing well functionally  Out of building regularly--doesn't get lost, etc

## 2016-08-02 NOTE — Assessment & Plan Note (Signed)
Regular Will continue the apixaban

## 2016-08-02 NOTE — Assessment & Plan Note (Signed)
No chest pain

## 2016-08-05 ENCOUNTER — Encounter: Payer: Self-pay | Admitting: Emergency Medicine

## 2016-08-05 ENCOUNTER — Emergency Department: Payer: Medicare Other

## 2016-08-05 ENCOUNTER — Emergency Department
Admission: EM | Admit: 2016-08-05 | Discharge: 2016-08-05 | Disposition: A | Discharge status: Home / Self Care | Payer: Medicare Other | Attending: Emergency Medicine | Admitting: Emergency Medicine

## 2016-08-05 DIAGNOSIS — G309 Alzheimer's disease, unspecified: Secondary | ICD-10-CM | POA: Insufficient documentation

## 2016-08-05 DIAGNOSIS — N390 Urinary tract infection, site not specified: Secondary | ICD-10-CM | POA: Diagnosis not present

## 2016-08-05 DIAGNOSIS — Z8546 Personal history of malignant neoplasm of prostate: Secondary | ICD-10-CM | POA: Insufficient documentation

## 2016-08-05 DIAGNOSIS — I2510 Atherosclerotic heart disease of native coronary artery without angina pectoris: Secondary | ICD-10-CM | POA: Insufficient documentation

## 2016-08-05 DIAGNOSIS — R410 Disorientation, unspecified: Secondary | ICD-10-CM | POA: Diagnosis not present

## 2016-08-05 DIAGNOSIS — Z7982 Long term (current) use of aspirin: Secondary | ICD-10-CM | POA: Diagnosis not present

## 2016-08-05 DIAGNOSIS — R42 Dizziness and giddiness: Secondary | ICD-10-CM | POA: Diagnosis present

## 2016-08-05 DIAGNOSIS — I10 Essential (primary) hypertension: Secondary | ICD-10-CM | POA: Diagnosis not present

## 2016-08-05 DIAGNOSIS — Z79899 Other long term (current) drug therapy: Secondary | ICD-10-CM | POA: Insufficient documentation

## 2016-08-05 LAB — CBC
HEMATOCRIT: 40 % (ref 40.0–52.0)
Hemoglobin: 13.5 g/dL (ref 13.0–18.0)
MCH: 30 pg (ref 26.0–34.0)
MCHC: 33.7 g/dL (ref 32.0–36.0)
MCV: 88.9 fL (ref 80.0–100.0)
PLATELETS: 156 10*3/uL (ref 150–440)
RBC: 4.5 MIL/uL (ref 4.40–5.90)
RDW: 15.4 % — ABNORMAL HIGH (ref 11.5–14.5)
WBC: 6.1 10*3/uL (ref 3.8–10.6)

## 2016-08-05 LAB — HEPATIC FUNCTION PANEL
ALBUMIN: 4.4 g/dL (ref 3.5–5.0)
ALT: 22 U/L (ref 17–63)
AST: 31 U/L (ref 15–41)
Alkaline Phosphatase: 60 U/L (ref 38–126)
Bilirubin, Direct: 0.1 mg/dL (ref 0.1–0.5)
Indirect Bilirubin: 0.4 mg/dL (ref 0.3–0.9)
TOTAL PROTEIN: 7 g/dL (ref 6.5–8.1)
Total Bilirubin: 0.5 mg/dL (ref 0.3–1.2)

## 2016-08-05 LAB — BASIC METABOLIC PANEL
Anion gap: 5 (ref 5–15)
BUN: 30 mg/dL — AB (ref 6–20)
CHLORIDE: 106 mmol/L (ref 101–111)
CO2: 28 mmol/L (ref 22–32)
CREATININE: 0.85 mg/dL (ref 0.61–1.24)
Calcium: 9.2 mg/dL (ref 8.9–10.3)
GFR calc Af Amer: >60 mL/min (ref >60)
GLUCOSE: 114 mg/dL — AB (ref 65–99)
POTASSIUM: 3.8 mmol/L (ref 3.5–5.1)
Sodium: 139 mmol/L (ref 135–145)

## 2016-08-05 LAB — URINALYSIS, COMPLETE (UACMP) WITH MICROSCOPIC
BACTERIA UA: NONE SEEN
Bilirubin Urine: NEGATIVE
Glucose, UA: NEGATIVE mg/dL
Ketones, ur: NEGATIVE mg/dL
Leukocytes, UA: NEGATIVE
Nitrite: NEGATIVE
PROTEIN: NEGATIVE mg/dL
SPECIFIC GRAVITY, URINE: 1.024 (ref 1.005–1.030)
pH: 5 (ref 5.0–8.0)

## 2016-08-05 LAB — PROTIME-INR
INR: 1.08
Prothrombin Time: 14 seconds (ref 11.4–15.2)

## 2016-08-05 LAB — AMMONIA: AMMONIA: 10 umol/L (ref 9–35)

## 2016-08-05 MED ORDER — DEXTROSE 5 % IV SOLN: 1.0000 g | Freq: Once | INTRAVENOUS | Status: DC

## 2016-08-05 MED ORDER — CEFTRIAXONE SODIUM-DEXTROSE 1-3.74 GM-% IV SOLR
INTRAVENOUS | Status: AC
  Administered 2016-08-05: 1 g via INTRAVENOUS
  Filled 2016-08-05: qty 50

## 2016-08-05 MED ORDER — CEPHALEXIN 500 MG PO CAPS: 500.0000 mg | ORAL_CAPSULE | Freq: Three times a day (TID) | ORAL | 0 refills | Status: AC

## 2016-08-05 MED ORDER — CEFTRIAXONE SODIUM-DEXTROSE 1-3.74 GM-% IV SOLR
1.0000 g | Freq: Once | INTRAVENOUS | Status: AC
  Administered 2016-08-05: 1 g via INTRAVENOUS

## 2016-08-05 NOTE — ED Triage Notes (Signed)
Pt sent from [facility] clinic for vertigo, headache, nausea, decreased oral intake since Thursday. 6/10 pain. Denies vision changes. No obvious weakness.

## 2016-08-05 NOTE — ED Provider Notes (Signed)
[facility] Emergency Department Provider Note  ____________________________________________   I have reviewed the triage vital signs and the nursing notes.   HISTORY  Chief Complaint Dizziness; Migraine; and Altered Mental Status    HPI Austin [name] is a 83 y.o. male who presents with a host of different symptoms. Family states he has been acting a little bit off the last day or 2. He sometimes says he has headache although at this time he states he has no headache. He states he often gets headaches but does not have one at this time. He has had no fever no chills no nausea no vomiting. To me he denies vertigo but apparently vertigo was reported as well. Patient does have a history of dementia. He has had no abdominal pain or cough. He denies bleeding or bright red blood ulna. He has had no dysuria or urinary frequency. He essentially is apparently negative review of systems. Family states she's been acting slightly confused over the last couple days and has complained of some headaches. There was no fall or trauma. He does live in an assisted living facility.     Past Medical History:  Diagnosis Date  . Alzheimer's dementia    ALZHEIMERS  . Atrial fibrillation [facility])    REVEAL DEVICE MEDTRONIC SERIAL [id number] S MODEL [id number]  . Coronary atherosclerosis of native coronary artery    MI 2014  . GERD (gastroesophageal reflux disease)    REFLUX  . Gout   . History of prostate cancer   . HTN (hypertension)   . LPRD (laryngopharyngeal reflux disease)   . Osteoarthritis, generalized    DJD neck and spine  . RBBB   . RBBB (right bundle branch block)     Patient Active Problem List   Diagnosis Date Noted  . Sore throat 08/02/2016  . Mood disorder ([place]) 01/12/2016  . Coronary atherosclerosis of native coronary artery   . LPRD (laryngopharyngeal reflux disease)   . HTN (hypertension)   . History of prostate cancer   . Atrial fibrillation ([place])   .  Osteoarthritis, generalized   . Gout   . Alzheimer's dementia     Past Surgical History:  Procedure Laterality Date  . ABDOMINAL ADHESION SURGERY    . APPENDECTOMY    . DIRECT LARYNGOSCOPY Left 04/19/2016   Procedure: DIRECT MICROSCOPIC LARYNGOSCOPY WITH BIOPSY;  Surgeon: Margaretha Sheffield, MD;  Location: [place];  Service: ENT;  Laterality: Left;  . EP IMPLANTABLE DEVICE     Reveal device Medtronic Serial [id number]  Model [id number]  . INGUINAL HERNIA REPAIR Left   . KNEE SURGERY    . PROSTATECTOMY    . SHOULDER ARTHROSCOPY    . TREATMENT FISTULA ANAL      Prior to Admission medications   Medication Sig Start Date End Date Taking? Authorizing Provider  acetaminophen (TYLENOL) 500 MG tablet Take 1 tablet by mouth every 6 (six) hours as needed. 09/21/15  Yes Historical Provider, MD  apixaban (ELIQUIS) 5 MG TABS tablet Take 1 tablet (5 mg total) by mouth 2 (two) times daily. 02/22/16  Yes Wende Bushy, MD  aspirin EC 81 MG tablet Take 1 tablet by mouth daily.   Yes Historical Provider, MD  calcium carbonate (TUMS EX) 750 MG chewable tablet Chew 2 tablets by mouth daily as needed.    Yes Historical Provider, MD  Cholecalciferol (VITAMIN D3) 2000 units capsule Take 1 capsule by mouth daily.    Yes Historical Provider, MD  citalopram (CELEXA) 10 MG  tablet Take 10 mg by mouth daily. 9 PM   Yes Historical Provider, MD  Coenzyme Q10 (COQ10) 200 MG CAPS Take by mouth.   Yes Historical Provider, MD  docusate sodium (COLACE) 100 MG capsule Take 1 capsule by mouth at bedtime.   Yes Historical Provider, MD  donepezil (ARICEPT) 10 MG tablet Take 1 tablet by mouth at bedtime.   Yes Historical Provider, MD  Magnesium Oxide 400 (240 Mg) MG TABS Take 1 tablet by mouth 2 (two) times daily. 08/19/15  Yes Historical Provider, MD  Multiple Vitamin (MULTIVITAMIN WITH MINERALS) TABS tablet Take 1 tablet by mouth daily.   Yes Historical Provider, MD  Naproxen Sodium (ALEVE) 220 MG CAPS Take 1 capsule by  mouth daily.   Yes Historical Provider, MD  ranitidine (ZANTAC) 150 MG tablet Take 1 tablet by mouth at bedtime.   Yes Historical Provider, MD  telmisartan (MICARDIS) 40 MG tablet Take 40 mg by mouth 2 (two) times daily at 10 AM and 5 PM.   Yes Historical Provider, MD  TURMERIC PO Take 1 tablet by mouth every evening.   Yes Historical Provider, MD  vitamin B-12 (CYANOCOBALAMIN) 1000 MCG tablet Take 1,000 mcg by mouth daily.   Yes Historical Provider, MD    Allergies Amiodarone; Multaq [dronedarone]; and Albuterol  Family History  Problem Relation Age of Onset  . Hypertension Mother   . Chronic Renal Failure Mother   . Cancer Neg Hx   . Heart disease Neg Hx   . Diabetes Neg Hx   . Prostate cancer Neg Hx     Social History Social History  Substance Use Topics  . Smoking status: Never Smoker  . Smokeless tobacco: Never Used  . Alcohol use Yes     Comment: 1 martini/ day    Review of Systems Constitutional: No fever/chills Eyes: No visual changes. ENT: No sore throat. No stiff neck no neck pain Cardiovascular: Denies chest pain. Respiratory: Denies shortness of breath. Gastrointestinal:   no vomiting.  No diarrhea.  No constipation. Genitourinary: Negative for dysuria. Musculoskeletal: Negative lower extremity swelling Skin: Negative for rash. Neurological: Negative for severe headaches, focal weakness or numbness. 10-point ROS otherwise negative.  ____________________________________________   PHYSICAL EXAM:  VITAL SIGNS: ED Triage Vitals  Enc Vitals Group     BP 08/05/16 1510 (!) 119/54     Pulse Rate 08/05/16 1510 77     Resp 08/05/16 1510 18     Temp 08/05/16 1510 98.7 F (37.1 C)     Temp Source 08/05/16 1510 Oral     SpO2 08/05/16 1510 100 %     Weight 08/05/16 1510 145 lb (65.8 kg)     Height 08/05/16 1510 5\' 3"  (1.6 m)     Head Circumference --      Peak Flow --      Pain Score 08/05/16 1524 6     Pain Loc --      Pain Edu? --      Excl. in [place]? --      Constitutional: Alert and oriented. Well appearing and in no acute distress.Patient conversational and engaged with no evidence of acute pathology noted Eyes: Conjunctivae are normal. PERRL. EOMI. Head: Atraumatic. Nose: No congestion/rhinnorhea. Mouth/Throat: Mucous membranes are moist.  Oropharynx non-erythematous. Neck: No stridor.   Nontender with no meningismus Cardiovascular: Normal rate, regular rhythm. Grossly normal heart sounds.  Good peripheral circulation. Respiratory: Normal respiratory effort.  No retractions. Lungs CTAB. Abdominal: Soft and nontender. No distention. No  guarding no rebound Back:  There is no focal tenderness or step off.  there is no midline tenderness there are no lesions noted. there is no CVA tenderness Musculoskeletal: No lower extremity tenderness, no upper extremity tenderness. No joint effusions, no DVT signs strong distal pulses no edema Neurologic:  Normal speech and language. No gross focal neurologic deficits are appreciated.  Skin:  Skin is warm, dry and intact. No rash noted. Psychiatric: Mood and affect are normal. Speech and behavior are normal.  ____________________________________________   LABS (all labs ordered are listed, but only abnormal results are displayed)  Labs Reviewed  BASIC METABOLIC PANEL - Abnormal; Notable for the following:       Result Value   Glucose, Bld 114 (*)    BUN 30 (*)    All other components within normal limits  CBC - Abnormal; Notable for the following:    RDW 15.4 (*)    All other components within normal limits  URINALYSIS, COMPLETE (UACMP) WITH MICROSCOPIC - Abnormal; Notable for the following:    Color, Urine YELLOW (*)    APPearance CLEAR (*)    Hgb urine dipstick LARGE (*)    Squamous Epithelial / LPF 0-5 (*)    All other components within normal limits  URINE CULTURE  AMMONIA  HEPATIC FUNCTION PANEL  PROTIME-INR  CBG MONITORING, ED    ____________________________________________  EKG  I personally interpreted any EKGs ordered by me or triage Sinus rhythm 83 bpm no acute ST elevation or depression, positive right bundle-branch noted, no acute ischemic changes ____________________________________________  RADIOLOGY  I reviewed any imaging ordered by me or triage that were performed during my shift and, if possible, patient and/or family made aware of any abnormal findings. ____________________________________________   PROCEDURES  Procedure(s) performed: None  Procedures  Critical Care performed: None  ____________________________________________   INITIAL IMPRESSION / ASSESSMENT AND PLAN / ED COURSE  Pertinent labs & imaging results that were available during my care of the patient were reviewed by me and considered in my medical decision making (see chart for details).  Very vaguely described symptoms. Exhaustive workup shows not much in the way of acute pathology. Patient remains awake and alert watching football with no complaints or concerns. Family feels he is at his baseline. I have discussed admission or discharge. It is noted however patient has white cells and red cells in his urine. Family states that the last time he had a urinary tract infection he A. Atelectasis and B. Had mostly blood in his urine. This certainly could be the cause of what they are noticing as erratic behavior over the last few days. He denied all the symptoms that they described to me. He does have a history of dementia. Nonetheless she is awake and alert and does understand the questions I am asking him. I discussed extensively with the family whether they would like him to be admitted to the hospital for go home. This is the height of the flu season. They would very much prefer to take him home. They prefer not to keep him in the hospital with multiple flea patients as well as the risk of increased disorientation. I do not think  this is unreasonable. I am therefore going to discharge him home with treatment empirically for a possible urinary tract infection. The family and patient understand at any time if they are concerned he can come back. At no time is a complaint of headache while he is here, CT is negative and  obviously he is not a candidate for lumbar puncture for multiple reasons. Given all of these concerns, we'll discharge the patient on antibiotic pending urine culture, and we will have them follow closely with primary care doctor tomorrow. Patient and family very comfortable with this and they're eager to go home.    ____________________________________________   FINAL CLINICAL IMPRESSION(S) / ED DIAGNOSES  Final diagnoses:  None      This chart was dictated using voice recognition software.  Despite best efforts to proofread,  errors can occur which can change meaning.      Schuyler Amor, MD 08/05/16 [phone]

## 2016-08-05 NOTE — ED Notes (Signed)
Report from felicia, rn.  

## 2016-08-05 NOTE — Discharge Instructions (Addendum)
At this time, we are very reassured by the workup here. If there are new or worrisome symptoms, please return to the emergency room. At this time, he would prefer to go home which I do not think is unreasonable, please know however if there is any concern we are always here and available to you to take care of him again. If he has increased confusion, fever, chills, vomiting, headache, stiff neck, chest pain, numbness, weakness, or any other new or worrisome symptoms please do not hesitate to return to the emergency department.

## 2016-08-05 NOTE — ED Notes (Signed)
Family at bedside. Pt denies needs, vss.

## 2016-08-06 ENCOUNTER — Ambulatory Visit: Payer: Medicare Other | Admitting: Speech Pathology

## 2016-08-07 ENCOUNTER — Encounter: Payer: Self-pay | Admitting: Emergency Medicine

## 2016-08-07 ENCOUNTER — Emergency Department
Admission: EM | Admit: 2016-08-07 | Discharge: 2016-08-07 | Disposition: A | Discharge status: Home / Self Care | Payer: Medicare Other | Attending: Emergency Medicine | Admitting: Emergency Medicine

## 2016-08-07 ENCOUNTER — Emergency Department: Payer: Medicare Other

## 2016-08-07 DIAGNOSIS — Z79899 Other long term (current) drug therapy: Secondary | ICD-10-CM | POA: Insufficient documentation

## 2016-08-07 DIAGNOSIS — Z7982 Long term (current) use of aspirin: Secondary | ICD-10-CM | POA: Diagnosis not present

## 2016-08-07 DIAGNOSIS — R51 Headache: Secondary | ICD-10-CM | POA: Insufficient documentation

## 2016-08-07 DIAGNOSIS — Z8546 Personal history of malignant neoplasm of prostate: Secondary | ICD-10-CM | POA: Insufficient documentation

## 2016-08-07 DIAGNOSIS — I10 Essential (primary) hypertension: Secondary | ICD-10-CM | POA: Insufficient documentation

## 2016-08-07 DIAGNOSIS — R42 Dizziness and giddiness: Secondary | ICD-10-CM

## 2016-08-07 LAB — CBC WITH DIFFERENTIAL/PLATELET
BASOS PCT: 1 %
Basophils Absolute: 0 10*3/uL (ref 0–0.1)
Eosinophils Absolute: 0.1 10*3/uL (ref 0–0.7)
Eosinophils Relative: 2 %
HEMATOCRIT: 38.1 % — AB (ref 40.0–52.0)
HEMOGLOBIN: 13.2 g/dL (ref 13.0–18.0)
LYMPHS ABS: 1.3 10*3/uL (ref 1.0–3.6)
Lymphocytes Relative: 22 %
MCH: 30.5 pg (ref 26.0–34.0)
MCHC: 34.7 g/dL (ref 32.0–36.0)
MCV: 87.8 fL (ref 80.0–100.0)
MONOS PCT: 12 %
Monocytes Absolute: 0.7 10*3/uL (ref 0.2–1.0)
NEUTROS ABS: 3.7 10*3/uL (ref 1.4–6.5)
NEUTROS PCT: 63 %
Platelets: 132 10*3/uL — ABNORMAL LOW (ref 150–440)
RBC: 4.34 MIL/uL — AB (ref 4.40–5.90)
RDW: 15 % — ABNORMAL HIGH (ref 11.5–14.5)
WBC: 5.8 10*3/uL (ref 3.8–10.6)

## 2016-08-07 LAB — COMPREHENSIVE METABOLIC PANEL
ALBUMIN: 4 g/dL (ref 3.5–5.0)
ALT: 22 U/L (ref 17–63)
AST: 27 U/L (ref 15–41)
Alkaline Phosphatase: 50 U/L (ref 38–126)
Anion gap: 6 (ref 5–15)
BILIRUBIN TOTAL: 0.8 mg/dL (ref 0.3–1.2)
BUN: 22 mg/dL — AB (ref 6–20)
CHLORIDE: 103 mmol/L (ref 101–111)
CO2: 27 mmol/L (ref 22–32)
Calcium: 9.3 mg/dL (ref 8.9–10.3)
Creatinine, Ser: 0.68 mg/dL (ref 0.61–1.24)
GFR calc Af Amer: >60 mL/min (ref >60)
GFR calc non Af Amer: >60 mL/min (ref >60)
GLUCOSE: 108 mg/dL — AB (ref 65–99)
POTASSIUM: 3.6 mmol/L (ref 3.5–5.1)
Sodium: 136 mmol/L (ref 135–145)
Total Protein: 6.6 g/dL (ref 6.5–8.1)

## 2016-08-07 LAB — URINALYSIS, COMPLETE (UACMP) WITH MICROSCOPIC
Bilirubin Urine: NEGATIVE
Glucose, UA: NEGATIVE mg/dL
KETONES UR: NEGATIVE mg/dL
Leukocytes, UA: NEGATIVE
Nitrite: NEGATIVE
PROTEIN: NEGATIVE mg/dL
Specific Gravity, Urine: 1.018 (ref 1.005–1.030)
pH: 6 (ref 5.0–8.0)

## 2016-08-07 LAB — URINE CULTURE: Culture: <10000 — AB

## 2016-08-07 LAB — TROPONIN I

## 2016-08-07 MED ORDER — DIAZEPAM 5 MG PO TABS: 5.0000 mg | ORAL_TABLET | Freq: Three times a day (TID) | ORAL | 0 refills | Status: DC | PRN

## 2016-08-07 MED ORDER — MECLIZINE HCL 25 MG PO TABS
50.0000 mg | ORAL_TABLET | Freq: Once | ORAL | Status: AC
  Administered 2016-08-07: 50 mg via ORAL
  Filled 2016-08-07: qty 2

## 2016-08-07 MED ORDER — DIAZEPAM 5 MG PO TABS
5.0000 mg | ORAL_TABLET | Freq: Once | ORAL | Status: AC
  Administered 2016-08-07: 5 mg via ORAL
  Filled 2016-08-07: qty 1

## 2016-08-07 MED ORDER — MECLIZINE HCL 25 MG PO TABS: 25.0000 mg | ORAL_TABLET | Freq: Three times a day (TID) | ORAL | 1 refills | Status: DC | PRN

## 2016-08-07 MED ORDER — SODIUM CHLORIDE 0.9 % IV SOLN
Freq: Once | INTRAVENOUS | Status: AC
  Administered 2016-08-07: 09:00:00 via INTRAVENOUS

## 2016-08-07 NOTE — ED Provider Notes (Signed)
[facility] Emergency Department Provider Note     L5 caveat: Review of systems history is limited by dementia.   Time seen: ----------------------------------------- 8:23 AM on 08/07/2016 -----------------------------------------    I have reviewed the triage vital signs and the nursing notes.   HISTORY  Chief Complaint Dizziness    HPI Austin [name] is a 83 y.o. male who presents to ER being brought by EMS from [facility] [place]. Patient claims of dizziness for approximately 2-3 days with worsening headache. Patient complains of diffuse headache and today states his head feels like it spinning. He states he has had some neck pain as well. He has history of dementia and is a very poor historian.   Past Medical History:  Diagnosis Date  . Alzheimer's dementia    ALZHEIMERS  . Atrial fibrillation [facility])    REVEAL DEVICE MEDTRON[employer] SERIAL [id number] S MODEL [id number]  . Coronary atherosclerosis of native coronary artery    MI 2014  . GERD (gastroesophageal reflux disease)    REFLUX  . Gout   . History of prostate cancer   . HTN (hypertension)   . LPRD (laryngopharyngeal reflux disease)   . Osteoarthritis, generalized    DJD neck and spine  . RBBB   . RBBB (right bundle branch block)     Patient Active Problem List   Diagnosis Date Noted  . Sore throat 08/02/2016  . Mood disorder ([place]) 01/12/2016  . Coronary atherosclerosis of native coronary artery   . LPRD (laryngopharyngeal reflux disease)   . HTN (hypertension)   . History of prostate cancer   . Atrial fibrillation ([place])   . Osteoarthritis, generalized   . Gout   . Alzheimer's dementia     Past Surgical History:  Procedure Laterality Date  . ABDOMINAL ADHESION SURGERY    . APPENDECTOMY    . DIRECT LARYNGOSCOPY Left 04/19/2016   Procedure: DIRECT MICROSCOPIC LARYNGOSCOPY WITH BIOPSY;  Surgeon: Margaretha Sheffield, MD;  Location: [place];  Service: ENT;  Laterality: Left;  . EP  IMPLANTABLE DEVICE     Reveal device Medtronic Serial [id number]  Model [id number]  . INGUINAL HERNIA REPAIR Left   . KNEE SURGERY    . PROSTATECTOMY    . SHOULDER ARTHROSCOPY    . TREATMENT FISTULA ANAL      Allergies Amiodarone; Multaq [dronedarone]; and Albuterol  Social History Social History  Substance Use Topics  . Smoking status: Never Smoker  . Smokeless tobacco: Never Used  . Alcohol use Yes     Comment: 1 martini/ day    Review of Systems Constitutional: Negative for fever. Gastrointestinal: Negative for abdominal pain, positive for nausea Neurological: Positive for headache, vertigo  10-point ROS otherwise negative, or difficult to obtain  ____________________________________________   PHYSICAL EXAM:  VITAL SIGNS: ED Triage Vitals  Enc Vitals Group     BP 08/07/16 0753 131/66     Pulse Rate 08/07/16 0753 (!) 52     Resp 08/07/16 0753 16     Temp 08/07/16 0753 98.1 F (36.7 C)     Temp Source 08/07/16 0753 Oral     SpO2 08/07/16 0753 99 %     Weight 08/07/16 0755 145 lb (65.8 kg)     Height 08/07/16 0755 5\' 8"  (1.727 m)     Head Circumference --      Peak Flow --      Pain Score 08/07/16 0754 9     Pain Loc --      Pain Edu? --  Excl. in [place]? --     Constitutional: Alert But disoriented, Well appearing and in no distress. Eyes: Conjunctivae are normal. PERRL. Normal extraocular movements. ENT   Head: Normocephalic and atraumatic.   Nose: No congestion/rhinnorhea.   Mouth/Throat: Mucous membranes are moist.   Neck: No stridor. Cardiovascular: Normal rate, regular rhythm. No murmurs, rubs, or gallops. Respiratory: Normal respiratory effort without tachypnea nor retractions. Breath sounds are clear and equal bilaterally. No wheezes/rales/rhonchi. Gastrointestinal: Soft and nontender. Normal bowel sounds Musculoskeletal: Nontender with normal range of motion in all extremities. No lower extremity tenderness nor edema. Neurologic:   Patient exhibits expressive aphasia at times, No gross focal neurologic deficits are appreciated. Strength and sensation appear normal Skin:  Skin is warm, dry and intact. No rash noted. Psychiatric: Mood and affect are normal.  ____________________________________________  EKG: Interpreted by me. Sinus bradycardia with a rate of 46 bpm, normal PR interval, wide QRS, normal QT, right bundle branch block.  ____________________________________________  ED COURSE:  Pertinent labs & imaging results that were available during my care of the patient were reviewed by me and considered in my medical decision making (see chart for details). Patient presents to the ER with symptoms of vertigo. We will assess with labs and imaging.   Procedures ____________________________________________   LABS (pertinent positives/negatives)  Labs Reviewed  CBC WITH DIFFERENTIAL/PLATELET - Abnormal; Notable for the following:       Result Value   RBC 4.34 (*)    HCT 38.1 (*)    RDW 15.0 (*)    Platelets 132 (*)    All other components within normal limits  COMPREHENSIVE METABOLIC PANEL - Abnormal; Notable for the following:    Glucose, Bld 108 (*)    BUN 22 (*)    All other components within normal limits  URINALYSIS, COMPLETE (UACMP) WITH MICROSCOPIC - Abnormal; Notable for the following:    Color, Urine YELLOW (*)    APPearance CLEAR (*)    Hgb urine dipstick LARGE (*)    Bacteria, UA RARE (*)    Squamous Epithelial / LPF 0-5 (*)    All other components within normal limits  TROPONIN I    RADIOLOGY Images were viewed by me  CT head, C-spine  IMPRESSION: 1. No acute intracranial pathology. 2. No acute osseous injury of the cervical spine. 3. Diffuse cervical spine spondylosis as described above. IMPRESSION: 1. No acute intracranial pathology. 2. No acute osseous injury of the cervical spine. 3. Diffuse cervical spine spondylosis as described above.  IMPRESSION: Chronic microvascular  ischemia without acute intracranial abnormality. ____________________________________________  FINAL ASSESSMENT AND PLAN  Vertigo  Plan: Patient with labs and imaging as dictated above. Patient is currently feeling better after fluids, meclizine and Valium. MRI was negative for acute ischemic etiology for his vertigo. He's been up and ambulatory and appears to be better. He is stable for outpatient follow-up.   Earleen Newport, MD   Note: This note was generated in part or whole with voice recognition software. Voice recognition is usually quite accurate but there are transcription errors that can and very often do occur. I apologize for any typographical errors that were not detected and corrected.     Earleen Newport, MD 08/07/16 [phone]

## 2016-08-07 NOTE — ED Triage Notes (Addendum)
Pt in via EMS from [facility].  Pt reports dizziness x approximately 2-3 days with worsening headache today, pt states, "the room is spinning."  Pt denies any photophobia, denies any changes in vision.  Pt A/OX3, vitals WDL.  Pt with hx of dementia; pt very poor historian.  NAD noted at this time.

## 2016-08-07 NOTE — ED Notes (Signed)
Patient transported to MRI 

## 2016-08-07 NOTE — ED Notes (Signed)
Patient transported to CT 

## 2016-08-08 ENCOUNTER — Ambulatory Visit: Payer: Medicare Other | Admitting: Speech Pathology

## 2016-08-14 ENCOUNTER — Ambulatory Visit: Payer: Medicare Other | Admitting: Speech Pathology

## 2016-08-16 ENCOUNTER — Ambulatory Visit: Payer: Medicare Other | Admitting: Speech Pathology

## 2016-08-17 ENCOUNTER — Ambulatory Visit: Payer: Medicare Other | Attending: Otolaryngology | Admitting: Speech Pathology

## 2016-08-17 ENCOUNTER — Encounter: Payer: Self-pay | Admitting: Speech Pathology

## 2016-08-17 DIAGNOSIS — R490 Dysphonia: Secondary | ICD-10-CM | POA: Diagnosis not present

## 2016-08-17 NOTE — Therapy (Signed)
[facility] MAIN [facility] SERVICES [address] [place], [place], [address] Phone: [phone]   Fax:  [phone]  Speech Language Pathology Evaluation  Patient Details  Name: Austin [name] MRN: [id number] Date of Birth: 12-14-1930 No Data Recorded  Encounter Date: 08/17/2016      End of Session - 08/17/16 1638    Visit Number 1   Number of Visits 1   Date for SLP Re-Evaluation 08/17/16   SLP Start Time 1502   SLP Stop Time  1600   SLP Time Calculation (min) 58 min   Activity Tolerance Patient tolerated treatment well      Past Medical History:  Diagnosis Date  . Alzheimer's dementia    ALZHEIMERS  . Atrial fibrillation [facility])    REVEAL DEVICE MEDTRONIC SERIAL #[id number] S MODEL #[id number]  . Coronary atherosclerosis of native coronary artery    MI 2014  . GERD (gastroesophageal reflux disease)    REFLUX  . Gout   . History of prostate cancer   . HTN (hypertension)   . LPRD (laryngopharyngeal reflux disease)   . Osteoarthritis, generalized    DJD neck and spine  . RBBB   . RBBB (right bundle branch block)     Past Surgical History:  Procedure Laterality Date  . ABDOMINAL ADHESION SURGERY    . APPENDECTOMY    . DIRECT LARYNGOSCOPY Left 04/19/2016   Procedure: DIRECT MICROSCOPIC LARYNGOSCOPY WITH BIOPSY;  Surgeon: Vernie Murders, MD;  Location: [facility];  Service: ENT;  Laterality: Left;  . EP IMPLANTABLE DEVICE     Reveal device Medtronic Serial [id number]  Model [id number]  . INGUINAL HERNIA REPAIR Left   . KNEE SURGERY    . PROSTATECTOMY    . SHOULDER ARTHROSCOPY    . TREATMENT FISTULA ANAL      There were no vitals filed for this visit.          SLP Evaluation [facility] - 08/17/16 0001      SLP Visit Information   SLP Received On 08/17/16     Subjective   Subjective The patient complains of raspy vocal quality   Patient/Family Stated Goal For patient to be less concerned about his voice     Pain Assessment    Pain Score 0-No pain     General Information   HPI 83 year old man referred by Dr. Elenore Rota for voice therapy to learn how to not strain his voice.       Prior Functional Status   Cognitive/Linguistic Baseline Baseline deficits   Baseline deficit details Dementia     Oral Motor/Sensory Function   Overall Oral Motor/Sensory Function Appears within functional limits for tasks assessed     Motor Speech   Overall Motor Speech Impaired   Respiration Impaired   Level of Impairment Conversation   Phonation Breathy;Hoarse;Low vocal intensity   Resonance Within functional limits   Articulation Within functional limitis   Intelligibility Intelligible   Phonation Impaired   Vocal Abuses Vocal Fold Dehydration;Prolonged Vocal Use;Glottal Attack   Tension Present Jaw;Neck;Shoulder   Volume Soft   Pitch Appropriate     Standardized Assessments   Standardized Assessments  Other Assessment  Perceptual Voice Evaluation      Perceptual Voice Evaluation Voice checklist: . Health risks: LPR  . Characteristic voice use: patient talks and sings a lot  . Environmental risks: no significant environmental risks . Misuse: excessive talking/singing . Abuse: some coughing/throat clearing . Vocal characteristics: breathy, hoarse, limited pitch range, reduced  vocal projection, pharyngeal resonance Maximum phonation time for sustained "ah": 16 seconds Average fundamental frequency during sustained "ah": 150 Hz Average time patient was able to sustain /s/: 7 seconds Average time patient was able to sustain /z/: 14 seconds s/z ratio : 2 Visi-Pitch: Multi-Dimensional Voice Program (MDVP)  MDVPT extracts objective quantitative values (Relative Average Perturbation, Shimmer, Voice Turbulence Index, and Noise to Harmonic Ratio) on sustained phonation, which are displayed graphically and numerically in comparison to a built-in normative database.  The patient exhibited values within the norm for Relative  Average Perturbation.  Average fundamental frequency was average for age and gender.  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 Feel the "buzz" on the lips with humming.  The patient is not as raspy as he thinks he is.  He is instructed to drink more water for internal hydration.      Plan - 09/02/2016 1639    Clinical Impression Statement This 83 year old man, with complaint of raspy vocal quality and strained voice at times, is presenting with mild dysphonia.  The patient demonstrates inconsistent hoarse/breathy vocal quality, pharyngeal resonance, strained/tense phonation, limited pitch range, and laryngeal tension. He is not a good candidate for direct therapy due to dementia.  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  He is able to generate a better sounding voice when directed to "project your voice".  Per his daughter, he is habitually humming, often with strained phonation.  He is able to reduced strained hum when instructed to feel the buzz on his lips.  The patient's daughter will provide staff education at his facility (she is a Doctor, general practice).   Speech Therapy Frequency One time visit   Treatment/Interventions Patient/family education   Potential to Achieve Goals Good   Potential Considerations Ability to learn/carryover information;Co-morbidities;Cooperation/participation level;Medical prognosis;Pain level;Previous level of function;Severity of impairments;Family/community support   SLP Home Exercise Plan Hydration, project voice, relaxed hum    Consulted and Agree with Plan of Care Patient;Family member/caregiver   Family Member Consulted Daughter      Patient will benefit from skilled therapeutic intervention in order to improve the following deficits and impairments:   Dysphonia - Plan: SLP plan of care cert/re-cert      G-Codes - 2016-09-02 1641    Functional Assessment Tool Used  Perceptual voice evaluation, clinical judgment   Functional Limitations Voice   Voice Current Status (G9171) At least 20 percent but less than 40 percent impaired, limited or restricted   Voice Goal Status (G9172) At least 20 percent but less than 40 percent impaired, limited or restricted   Voice Discharge Status (Z6109) At least 20 percent but less than 40 percent impaired, limited or restricted      Problem List Patient Active Problem List   Diagnosis Date Noted  . Sore throat 08/02/2016  . Mood disorder (HCC) 01/12/2016  . Coronary atherosclerosis of native coronary artery   . LPRD (laryngopharyngeal reflux disease)   . HTN (hypertension)   . History of prostate cancer   . Atrial fibrillation (HCC)   . Osteoarthritis, generalized   . Gout   . Alzheimer's dementia    Dollene Primrose, MS/CCC- SLP  Leandrew Koyanagi Sep 02, 2016, 4:44 PM  [facility] MAIN [facility] SERVICES [address] [place], [place], [address] Phone: [phone]   Fax:  [phone]  Name: Austin [name] MRN: [id number] Date of Birth: 1931/01/25

## 2016-08-18 LAB — CUP PACEART REMOTE DEVICE CHECK
Date Time Interrogation Session: 20171230123914
Implantable Pulse Generator Implant Date: 20140806

## 2016-08-18 NOTE — Progress Notes (Signed)
Carelink summary report received. Battery status OK. Normal device function. No new symptom episodes, tachy episodes, brady, or pause episodes. 2 AF 0.4% +eliquis. Monthly summary reports and ROV/PRN

## 2016-08-27 LAB — CUP PACEART REMOTE DEVICE CHECK
Date Time Interrogation Session: 20180129130849
Implantable Pulse Generator Implant Date: 20140806

## 2016-08-29 ENCOUNTER — Ambulatory Visit (INDEPENDENT_AMBULATORY_CARE_PROVIDER_SITE_OTHER): Payer: Medicare Other | Admitting: *Deleted

## 2016-08-29 DIAGNOSIS — I480 Paroxysmal atrial fibrillation: Secondary | ICD-10-CM

## 2016-08-29 NOTE — Progress Notes (Signed)
Carelink Summary Report / Loop Recorder 

## 2016-08-31 ENCOUNTER — Ambulatory Visit: Payer: Medicare Other | Admitting: Speech Pathology

## 2016-09-05 ENCOUNTER — Ambulatory Visit: Payer: Medicare Other | Admitting: Speech Pathology

## 2016-09-07 ENCOUNTER — Ambulatory Visit: Payer: Medicare Other | Admitting: Speech Pathology

## 2016-09-11 ENCOUNTER — Telehealth: Payer: Self-pay | Admitting: Cardiology

## 2016-09-11 ENCOUNTER — Ambulatory Visit: Payer: Medicare Other | Admitting: Speech Pathology

## 2016-09-11 NOTE — Telephone Encounter (Signed)
Pt daughter called, asks if pt can have fasting labs done at [facility]. Please call and advise.

## 2016-09-11 NOTE — Telephone Encounter (Signed)
Left detailed voicemail message that it wold be OK for him to have labs done there if possible and to have them send [place] the results with instructions to call back if further questions.

## 2016-09-14 ENCOUNTER — Ambulatory Visit: Payer: Medicare Other | Admitting: Speech Pathology

## 2016-09-14 LAB — CUP PACEART REMOTE DEVICE CHECK
Date Time Interrogation Session: 20180228131938
MDC IDC PG IMPLANT DT: 20140806

## 2016-09-17 ENCOUNTER — Ambulatory Visit: Payer: Medicare Other | Admitting: Speech Pathology

## 2016-09-17 ENCOUNTER — Encounter: Payer: Self-pay | Admitting: Internal Medicine

## 2016-09-18 ENCOUNTER — Telehealth: Payer: Self-pay | Admitting: Cardiology

## 2016-09-18 NOTE — Telephone Encounter (Signed)
Called to schedule appt .  This is already scheduled .  Disregard call.

## 2016-09-19 ENCOUNTER — Ambulatory Visit: Payer: Medicare Other | Admitting: Speech Pathology

## 2016-09-24 ENCOUNTER — Ambulatory Visit: Payer: Medicare Other | Admitting: Speech Pathology

## 2016-09-26 ENCOUNTER — Ambulatory Visit: Payer: Medicare Other | Admitting: Speech Pathology

## 2016-09-27 ENCOUNTER — Telehealth: Payer: Self-pay | Admitting: *Deleted

## 2016-09-27 NOTE — Telephone Encounter (Signed)
Austin [name] returned call.  Patient has upcoming appointment with Dr. Yvone Neu on 11/13/16.  She would like to discuss potential need for ILR replacement with Dr. Yvone Neu at this appointment and declines to schedule an appointment with Dr. Caryl Comes at this time.  Advised that she can call the [facility] in the future if she wishes to schedule appointment with Dr. Caryl Comes to discuss explant.  She verbalizes understanding and appreciation.

## 2016-09-27 NOTE — Telephone Encounter (Signed)
LMOVM for patient's daughter.  [facility] phone number for return call.  LINQ at [facility] as of 09/06/16.

## 2016-09-28 ENCOUNTER — Ambulatory Visit (INDEPENDENT_AMBULATORY_CARE_PROVIDER_SITE_OTHER): Payer: Medicare Other | Admitting: *Deleted

## 2016-09-28 DIAGNOSIS — I480 Paroxysmal atrial fibrillation: Secondary | ICD-10-CM | POA: Diagnosis not present

## 2016-09-28 NOTE — Progress Notes (Signed)
Carelink Summary Report / Loop Recorder 

## 2016-10-11 LAB — CUP PACEART REMOTE DEVICE CHECK
Date Time Interrogation Session: 20180330134129
Implantable Pulse Generator Implant Date: 20140806

## 2016-10-13 ENCOUNTER — Encounter: Payer: Self-pay | Admitting: Emergency Medicine

## 2016-10-13 ENCOUNTER — Emergency Department
Admission: EM | Admit: 2016-10-13 | Discharge: 2016-10-13 | Disposition: A | Discharge status: Home / Self Care | Payer: Medicare Other | Attending: Emergency Medicine | Admitting: Emergency Medicine

## 2016-10-13 DIAGNOSIS — M545 Low back pain, unspecified: Secondary | ICD-10-CM

## 2016-10-13 DIAGNOSIS — Z79899 Other long term (current) drug therapy: Secondary | ICD-10-CM | POA: Diagnosis not present

## 2016-10-13 DIAGNOSIS — Z7982 Long term (current) use of aspirin: Secondary | ICD-10-CM | POA: Insufficient documentation

## 2016-10-13 DIAGNOSIS — I10 Essential (primary) hypertension: Secondary | ICD-10-CM | POA: Diagnosis not present

## 2016-10-13 DIAGNOSIS — G309 Alzheimer's disease, unspecified: Secondary | ICD-10-CM | POA: Diagnosis not present

## 2016-10-13 DIAGNOSIS — M549 Dorsalgia, unspecified: Secondary | ICD-10-CM

## 2016-10-13 DIAGNOSIS — I2510 Atherosclerotic heart disease of native coronary artery without angina pectoris: Secondary | ICD-10-CM | POA: Insufficient documentation

## 2016-10-13 MED ORDER — DIAZEPAM 5 MG PO TABS: 5.0000 mg | ORAL_TABLET | Freq: Three times a day (TID) | ORAL | 0 refills | Status: DC | PRN

## 2016-10-13 MED ORDER — DIAZEPAM 5 MG PO TABS
5.0000 mg | ORAL_TABLET | Freq: Once | ORAL | Status: AC
  Administered 2016-10-13: 5 mg via ORAL
  Filled 2016-10-13: qty 1

## 2016-10-13 MED ORDER — KETOROLAC TROMETHAMINE 60 MG/2ML IM SOLN
60.0000 mg | Freq: Once | INTRAMUSCULAR | Status: AC
  Administered 2016-10-13: 60 mg via INTRAMUSCULAR
  Filled 2016-10-13: qty 2

## 2016-10-13 MED ORDER — KETOROLAC TROMETHAMINE 10 MG PO TABS: 10.0000 mg | ORAL_TABLET | Freq: Three times a day (TID) | ORAL | 0 refills | Status: DC | PRN

## 2016-10-13 NOTE — ED Triage Notes (Signed)
Pt brought in by [facility] from [facility]. Pt reportedly started having back pain this morning after walking 1.5 miles yesterday around grounds at [facility]. Per EMS pt not able to walk or stand. Pt denies loss of bowel or bladder control. Pt states that he is unable to get up or walk because it is too painful. Patient able to move and lift both legs. Denies numbness or tingling.

## 2016-10-13 NOTE — Discharge Instructions (Signed)
You may continue to do your normal amount of activity. Please avoid lifting any objects greater than 20 pounds or starting any new exercise regimens until your pain has resolved.  Tylenol for ear pain, or Toradol, with food. If you take Toradol, do not take any other NSAID medications such as Advil, Aleve, Motrin or ibuprofen. Valium is for severe muscle spasms. Do not drive within 8 hours of taking Valium.  Return to the emergency department if you develop worsening pain, changes in her bowel or bladder habits, fever, numbness tingling or weakness, difficulty walking, or any other symptoms concerning to you.

## 2016-10-13 NOTE — ED Provider Notes (Signed)
[facility] Emergency Department Provider Note  ____________________________________________  Time seen: Approximately 12:06 PM  I have reviewed the triage vital signs and the nursing notes.   HISTORY  Chief Complaint Back Pain    HPI Austin [name] is a 83 y.o. male with a history of generalized osteoarthritis and known degenerative disc diseasein the C-spine presenting with diffuse low back pain. The patient denies any abnormal activities, has been doing normal daily walking. He has not had any trauma or lifted anything heavy. This pain started today and he has tried an ointment which did not help. He has not tried any medications. The patient has not had any urinary or fecal incontinence or retention, saddle anesthesia, numbness or tingling, or difficulty walking. No fevers or chills or urinary symptoms.   Past Medical History:  Diagnosis Date  . Alzheimer's dementia    ALZHEIMERS  . Atrial fibrillation [facility])    REVEAL DEVICE MEDTRONIC SERIAL #[id number] S MODEL #[id number]  . Coronary atherosclerosis of native coronary artery    MI 2014  . GERD (gastroesophageal reflux disease)    REFLUX  . Gout   . History of prostate cancer   . HTN (hypertension)   . LPRD (laryngopharyngeal reflux disease)   . Osteoarthritis, generalized    DJD neck and spine  . RBBB   . RBBB (right bundle branch block)     Patient Active Problem List   Diagnosis Date Noted  . Sore throat 08/02/2016  . Mood disorder ([place]) 01/12/2016  . Coronary atherosclerosis of native coronary artery   . LPRD (laryngopharyngeal reflux disease)   . HTN (hypertension)   . History of prostate cancer   . Atrial fibrillation ([place])   . Osteoarthritis, generalized   . Gout   . Alzheimer's dementia     Past Surgical History:  Procedure Laterality Date  . ABDOMINAL ADHESION SURGERY    . APPENDECTOMY    . DIRECT LARYNGOSCOPY Left 04/19/2016   Procedure: DIRECT MICROSCOPIC LARYNGOSCOPY WITH  BIOPSY;  Surgeon: Margaretha Sheffield, MD;  Location: [facility];  Service: ENT;  Laterality: Left;  . EP IMPLANTABLE DEVICE     Reveal device Medtronic Serial [id number] S  Model [id number]  . INGUINAL HERNIA REPAIR Left   . KNEE SURGERY    . PROSTATECTOMY    . SHOULDER ARTHROSCOPY    . TREATMENT FISTULA ANAL      Current Outpatient Rx  . Order #: [id number] Class: Historical Med  . Order #: [id number] Class: Normal  . Order #: [id number] Class: Historical Med  . Order #: [id number] Class: Historical Med  . Order #: [id number] Class: Historical Med  . Order #: [id number] Class: Historical Med  . Order #: [id number] Class: Historical Med  . Order #: [id number] Class: Historical Med  . Order #: [id number] Class: Historical Med  . Order #: [id number] Class: Historical Med  . Order #: [id number] Class: Print  . Order #: [id number] Class: Historical Med  . Order #: [id number] Class: Historical Med  . Order #: [id number] Class: Historical Med  . Order #: [id number] Class: Historical Med  . Order #: [id number] Class: Historical Med  . Order #: [id number] Class: Historical Med  . Order #: [id number] Class: Print  . Order #: [id number] Class: Print    Allergies Amiodarone; Multaq [dronedarone]; and Albuterol  Family History  Problem Relation Age of Onset  . Hypertension Mother   . Chronic Renal Failure Mother   . Cancer Neg Hx   . Heart disease Neg Hx   . Diabetes Neg Hx   . Prostate cancer Neg Hx  Social History Social History  Substance Use Topics  . Smoking status: Never Smoker  . Smokeless tobacco: Never Used  . Alcohol use Yes     Comment: 1 martini/ day    Review of Systems Constitutional: No fever/chills.No lightheadedness or syncope. No trauma. ENT: No sore throat. No congestion or rhinorrhea. Cardiovascular: Denies chest pain. Denies palpitations. Respiratory: Denies shortness of breath.  No cough. Gastrointestinal: No abdominal pain.  No nausea, no vomiting.  No diarrhea.  No  constipation. Genitourinary: Negative for dysuria. Musculoskeletal: Significant for diffuse mid and low back pain. Skin: Negative for rash. Neurological: Negative for headaches. No focal numbness, tingling or weakness. No urinary or fecal retention or incontinence. No saddle anesthesia. No difficulty walking.  10-point ROS otherwise negative.  ____________________________________________   PHYSICAL EXAM:  VITAL SIGNS: ED Triage Vitals  Enc Vitals Group     BP 10/13/16 1148 120/63     Pulse Rate 10/13/16 1148 67     Resp 10/13/16 1148 16     Temp 10/13/16 1148 98 F (36.7 C)     Temp Source 10/13/16 1148 Oral     SpO2 10/13/16 1148 98 %     Weight 10/13/16 1149 151 lb (68.5 kg)     Height 10/13/16 1149 5\' 8"  (1.727 m)     Head Circumference --      Peak Flow --      Pain Score 10/13/16 1147 8     Pain Loc --      Pain Edu? --      Excl. in [place]? --     Constitutional: Alert. Well appearing And comfortable when not moving but has some discomfort with positional changes. Answers questions appropriately. Eyes: Conjunctivae are normal.  EOMI. No scleral icterus. Head: Atraumatic. Nose: No congestion/rhinnorhea. Mouth/Throat: Mucous membranes are moist.  Neck: No stridor.  Supple.  No JVD. No new midline C-spine tenderness to palpation, step-offs or deformities. Cardiovascular: Normal rate, regular rhythm. No murmurs, rubs or gallops.  Respiratory: Normal respiratory effort.  No accessory muscle use or retractions. Lungs CTAB.  No wheezes, rales or ronchi. Gastrointestinal: Soft, nontender and nondistended.  No guarding or rebound.  No peritoneal signs. Musculoskeletal: Diffuse tenderness in the back from the low T-spine to the mid lumbar spine without midline step-offs or deformities. The patient's pain is more pronounced in the right paraspinal area and is present both in the midline and diffusely laterally to the spine. No LE edema. No ttp in the calves or palpable cords.   Negative Homan's sign. Neurologic:  A&Ox3.  Speech is clear.  Face and smile are symmetric.  EOMI.  5 out of 5 dorsi flexion and plantar flexion, hip flexion, quadriceps and hamstring strength. Normal sensation to light touch throughout the lower extremities. Normal rate without ataxia.  Skin:  Skin is warm, dry and intact. No rash noted. Psychiatric: Mood and affect are normal.  ____________________________________________   LABS (all labs ordered are listed, but only abnormal results are displayed)  Labs Reviewed - No data to display ____________________________________________  EKG  Not indicated ____________________________________________  RADIOLOGY  No results found.  ____________________________________________   PROCEDURES  Procedure(s) performed: None  Procedures  Critical Care performed: No ____________________________________________   INITIAL IMPRESSION / ASSESSMENT AND PLAN / ED COURSE  Pertinent labs & imaging results that were available during my care of the patient were reviewed by me and considered in my medical decision making (see chart for details).  83 y.o. male with a history  of diffuse degenerative disc disease and our osteoarthritis presenting with diffuse back pain. The patient has no signs or symptoms that would be concerning for chronic wine or spinal cord compression. I will plan to treat him symptomatically with an antispasmodic and an NSAID medication. Plan reevaluation for final disposition but no imaging is indicated at this time.  ----------------------------------------- 1:28 PM on 10/13/2016 -----------------------------------------  The patient is feeling much better at this time. He is able tablet without any difficulty. Plan discharge home.  ____________________________________________  FINAL CLINICAL IMPRESSION(S) / ED DIAGNOSES  Final diagnoses:  Mid back pain  Acute bilateral low back pain without sciatica          NEW MEDICATIONS STARTED DURING THIS VISIT:  New Prescriptions   DIAZEPAM (VALIUM) 5 MG TABLET    Take 1 tablet (5 mg total) by mouth every 8 (eight) hours as needed for anxiety.   KETOROLAC (TORADOL) 10 MG TABLET    Take 1 tablet (10 mg total) by mouth every 8 (eight) hours as needed for moderate pain (with food).      Eula Listen, MD 10/13/16 [id number]

## 2016-10-13 NOTE — ED Notes (Signed)
Spoke with pts daughter and POA, [name]. [name] inquired about patients status. This RN informed her that pt had not been in ED long and that the doctor was currently in the room seeing the patient. Per [name], she is out of town but her husband should be able to come pick patient back up and take him to [facility].  Dr. Mariea Clonts spoke with pts dtr and informed her of plan of care for patient.   [name] Cell Phone: [phone]

## 2016-10-13 NOTE — ED Notes (Signed)
Pt able to ambulate from room 11 to restroom and back to room 11 without difficulty.

## 2016-10-13 NOTE — ED Notes (Signed)
Spoke with [name] and informed her that pt is going to be discharged. She will send her husband to pick pt up and transport him back to [facility].

## 2016-10-17 ENCOUNTER — Encounter: Payer: Self-pay | Admitting: Internal Medicine

## 2016-10-17 ENCOUNTER — Non-Acute Institutional Stay: Payer: Medicare Other | Admitting: Internal Medicine

## 2016-10-17 VITALS — BP 138/64 | HR 78 | Temp 98.6°F | Wt 149.0 lb

## 2016-10-17 DIAGNOSIS — M47816 Spondylosis without myelopathy or radiculopathy, lumbar region: Secondary | ICD-10-CM

## 2016-10-17 DIAGNOSIS — M545 Low back pain, unspecified: Secondary | ICD-10-CM

## 2016-10-17 DIAGNOSIS — G8929 Other chronic pain: Secondary | ICD-10-CM

## 2016-10-17 NOTE — Progress Notes (Signed)
Subjective:    Patient ID: Austin [name], male    DOB: October 13, 1930, 83 y.o.   MRN: [id number]  HPI  Asked to evaluate resident in [address]. Ongoing back pain for a few weeks now. To ER 4/14 for the same. No xrays or MRI's were done. He was treated with Toradol and Valium. He reports he has had pain like this before, which resolved spontaneously. He reports about 1 week ago, the pain returned. He describes the pain as constant achy. It is worse with certain position changes and with taking a deep breath. The pain does not radiate down his legs. He denies numbness or tingling. He denies pain in his hip or knees. He denies recent fall or injury to the area. He denies issues with his bowel or bladder. He reports his daughter took him to an orthopedist a few weeks ago, but he can not remember the name. He reports they gave him a massage and that helped. He reports the group exercise classes he takes also helps. He had an xray in 2016 which showed severe degenerative disease of the lumbar spine. He is interested in further evaluation of his back pain at this time.  Review of Systems      Past Medical History:  Diagnosis Date  . Alzheimer's dementia    ALZHEIMERS  . Atrial fibrillation [facility])    REVEAL DEVICE MEDTRONIC SERIAL #[id number] S MODEL #[id number]  . Coronary atherosclerosis of native coronary artery    MI 2014  . GERD (gastroesophageal reflux disease)    REFLUX  . Gout   . History of prostate cancer   . HTN (hypertension)   . LPRD (laryngopharyngeal reflux disease)   . Osteoarthritis, generalized    DJD neck and spine  . RBBB   . RBBB (right bundle branch block)     Current Outpatient Prescriptions  Medication Sig Dispense Refill  . acetaminophen (TYLENOL) 500 MG tablet Take 1 tablet by mouth every 6 (six) hours as needed.    Marland Kitchen apixaban (ELIQUIS) 5 MG TABS tablet Take 1 tablet (5 mg total) by mouth 2 (two) times daily. 60 tablet 6  . aspirin EC 81 MG tablet Take 1 tablet by mouth  daily.    . calcium carbonate (TUMS EX) 750 MG chewable tablet Chew 2 tablets by mouth daily as needed.     . Cholecalciferol (VITAMIN D3) 2000 units capsule Take 1 capsule by mouth daily.     . citalopram (CELEXA) 10 MG tablet Take 5 mg by mouth daily. 9 PM    . Coenzyme Q10 (COQ10) 200 MG CAPS Take by mouth.    . diazepam (VALIUM) 5 MG tablet Take 1 tablet (5 mg total) by mouth every 8 (eight) hours as needed for anxiety. 30 tablet 0  . docusate sodium (COLACE) 100 MG capsule Take 1 capsule by mouth at bedtime.    . donepezil (ARICEPT) 10 MG tablet Take 1 tablet by mouth at bedtime.    Marland Kitchen ketorolac (TORADOL) 10 MG tablet Take 1 tablet (10 mg total) by mouth every 8 (eight) hours as needed for moderate pain (with food). 10 tablet 0  . Magnesium Oxide 400 (240 Mg) MG TABS Take 1 tablet by mouth 2 (two) times daily.    . meclizine (ANTIVERT) 25 MG tablet Take 1 tablet (25 mg total) by mouth 3 (three) times daily as needed for dizziness or nausea. 30 tablet 1  . Multiple Vitamin (MULTIVITAMIN WITH MINERALS) TABS tablet Take 1 tablet by mouth  daily.    . Naproxen Sodium (ALEVE) 220 MG CAPS Take 1 capsule by mouth daily.    . ranitidine (ZANTAC) 150 MG tablet Take 1 tablet by mouth at bedtime.    Marland Kitchen telmisartan (MICARDIS) 40 MG tablet Take 40 mg by mouth 2 (two) times daily at 10 AM and 5 PM.    . TURMERIC PO Take 1 tablet by mouth every evening.    . vitamin B-12 (CYANOCOBALAMIN) 1000 MCG tablet Take 1,000 mcg by mouth daily.     No current facility-administered medications for this visit.     Allergies  Allergen Reactions  . Amiodarone Other (See Comments)    Trouble waking/falls  . Multaq [Dronedarone] Other (See Comments)    unknown  . Albuterol Palpitations    Family History  Problem Relation Age of Onset  . Hypertension Mother   . Chronic Renal Failure Mother   . Cancer Neg Hx   . Heart disease Neg Hx   . Diabetes Neg Hx   . Prostate cancer Neg Hx     Social History   Social  History  . Marital status: Widowed    Spouse name: N/A  . Number of children: 3  . Years of education: N/A   Occupational History  . 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 Smoking status: Never Smoker  . Smokeless tobacco: Never Used  . Alcohol use Yes     Comment: 1 martini/ day  . Drug use: No  . Sexual activity: Not on file   Other Topics Concern  . Not on file   Social History Narrative   Has living will   All three children are health care POA   Would accept resuscitation attempts   No tube feeds if cognitively unaware     Constitutional: Denies fever, malaise, fatigue, headache or abrupt weight changes.  Respiratory: Denies difficulty breathing, shortness of breath, cough or sputum production.   Cardiovascular: Denies chest pain, chest tightness, palpitations or swelling in the hands or feet.  Gastrointestinal: Denies abdominal pain, bloating, constipation, diarrhea or blood in the stool.  GU: Denies urgency, frequency, pain with urination, burning sensation, blood in urine, odor or discharge. Musculoskeletal: Pt reports back pain. Denies difficulty with gait, muscle pain or joint swelling.  Neurological: Denies dizziness, difficulty with memory, difficulty with speech or problems with balance and coordination.   No other specific complaints in a complete review of systems (except as listed in HPI above).  Objective:   Physical Exam   BP 138/64   Pulse 78   Temp 98.6 F (37 C)   Wt 149 lb (67.6 kg)   BMI 22.66 kg/m  Wt Readings from Last 3 Encounters:  10/17/16 149 lb (67.6 kg)  10/13/16 151 lb (68.5 kg)  08/07/16 145 lb (65.8 kg)    General: Appears his stated age, in NAD. Musculoskeletal: Decreased flexion but normal extension, rotation and lateral bending. He has pain with palpation over the lumbar spine. Gait slow but steady. Neurological: Sensation intact to BLE.    BMET    Component Value Date/Time   NA  136 08/07/2016 0926   NA 144 05/15/2016 0857   K 3.6 08/07/2016 0926   CL 103 08/07/2016 0926   CO2 27 08/07/2016 0926   GLUCOSE 108 (H) 08/07/2016 0926   BUN 22 (H) 08/07/2016 0926   BUN 20 05/15/2016 0857   CREATININE 0.68 08/07/2016 0926   CALCIUM 9.3 08/07/2016  0240   GFRNONAA >60 08/07/2016 0926   GFRAA >60 08/07/2016 0926    Lipid Panel     Component Value Date/Time   CHOL 178 05/15/2016 0857   TRIG 106 05/15/2016 0857   HDL 53 05/15/2016 0857   CHOLHDL 3.4 05/15/2016 0857   LDLCALC 104 (H) 05/15/2016 0857    CBC    Component Value Date/Time   WBC 5.8 08/07/2016 0926   RBC 4.34 (L) 08/07/2016 0926   HGB 13.2 08/07/2016 0926   HCT 38.1 (L) 08/07/2016 0926   PLT 132 (L) 08/07/2016 0926   MCV 87.8 08/07/2016 0926   MCH 30.5 08/07/2016 0926   MCHC 34.7 08/07/2016 0926   RDW 15.0 (H) 08/07/2016 0926   LYMPHSABS 1.3 08/07/2016 0926   MONOABS 0.7 08/07/2016 0926   EOSABS 0.1 08/07/2016 0926   BASOSABS 0.0 08/07/2016 0926    Hgb A1C No results found for: HGBA1C         Assessment & Plan:   ER follow up for Low Back Pain secondary to Osteoarthritis:  ER notes, and imaging reviewed Pain due to OA He is on Aleve, ASA, Eliquis and now Toradol Stop Aleve, schedule Tylenol Make Toradol prn Continue Valium I do not think he needs xray at this time D/w Laurence Aly. She will ask daughter if she is okay with physiatry referral for spinal injections  Will follow up as needed Webb Silversmith, NP

## 2016-10-17 NOTE — Patient Instructions (Signed)
Back Pain, Adult  Back pain is very common. The pain often gets better over time. The cause of back pain is usually not dangerous. Most people can learn to manage their back pain on their own.  Follow these instructions at home:  Watch your back pain for any changes. The following actions may help to lessen any pain you are feeling:  · Stay active. Start with short walks on flat ground if you can. Try to walk farther each day.  · Exercise regularly as told by your doctor. Exercise helps your back heal faster. It also helps avoid future injury by keeping your muscles strong and flexible.  · Do not sit, drive, or stand in one place for more than 30 minutes.  · Do not stay in bed. Resting more than 1-2 days can slow down your recovery.  · Be careful when you bend or lift an object. Use good form when lifting:  ? Bend at your knees.  ? Keep the object close to your body.  ? Do not twist.  · Sleep on a firm mattress. Lie on your side, and bend your knees. If you lie on your back, put a pillow under your knees.  · Take medicines only as told by your doctor.  · Put ice on the injured area.  ? Put ice in a plastic bag.  ? Place a towel between your skin and the bag.  ? Leave the ice on for 20 minutes, 2-3 times a day for the first 2-3 days. After that, you can switch between ice and heat packs.  · Avoid feeling anxious or stressed. Find good ways to deal with stress, such as exercise.  · Maintain a healthy weight. Extra weight puts stress on your back.    Contact a doctor if:  · You have pain that does not go away with rest or medicine.  · You have worsening pain that goes down into your legs or buttocks.  · You have pain that does not get better in one week.  · You have pain at night.  · You lose weight.  · You have a fever or chills.  Get help right away if:  · You cannot control when you poop (bowel movement) or pee (urinate).  · Your arms or legs feel weak.  · Your arms or legs lose feeling (numbness).  · You feel sick  to your stomach (nauseous) or throw up (vomit).  · You have belly (abdominal) pain.  · You feel like you may pass out (faint).  This information is not intended to replace advice given to you by your health care provider. Make sure you discuss any questions you have with your health care provider.  Document Released: 12/05/2007 Document Revised: 11/24/2015 Document Reviewed: 10/20/2013  Elsevier Interactive Patient Education © 2017 Elsevier Inc.

## 2016-10-29 ENCOUNTER — Ambulatory Visit (INDEPENDENT_AMBULATORY_CARE_PROVIDER_SITE_OTHER): Payer: Medicare Other | Admitting: *Deleted

## 2016-10-29 DIAGNOSIS — I480 Paroxysmal atrial fibrillation: Secondary | ICD-10-CM | POA: Diagnosis not present

## 2016-10-29 NOTE — Progress Notes (Signed)
Carelink Summary Report / Loop Recorder 

## 2016-10-31 ENCOUNTER — Telehealth: Payer: Self-pay | Admitting: Internal Medicine

## 2016-10-31 ENCOUNTER — Telehealth: Payer: Self-pay | Admitting: Cardiology

## 2016-10-31 NOTE — Telephone Encounter (Signed)
New message    Pt is returning call to [facility] about device.

## 2016-10-31 NOTE — Telephone Encounter (Signed)
LMOVM requesting that pt send manual transmission b/c home monitor has not updated in at least 14 days.    

## 2016-11-01 NOTE — Telephone Encounter (Signed)
Spoke w/ pt daughter and she informed me that she has unplugged his monitor b/c pt ILR reached RRT. She is going to speak w/ Dr. Yvone Neu at pt next appt to see if pt needs further follow up before deciding weather or not to see an EP MD.

## 2016-11-08 ENCOUNTER — Encounter: Payer: Self-pay | Admitting: Cardiology

## 2016-11-08 ENCOUNTER — Ambulatory Visit (INDEPENDENT_AMBULATORY_CARE_PROVIDER_SITE_OTHER): Payer: Medicare Other | Admitting: Cardiology

## 2016-11-08 VITALS — BP 124/62 | HR 75 | Ht 64.0 in | Wt 147.0 lb

## 2016-11-08 DIAGNOSIS — I480 Paroxysmal atrial fibrillation: Secondary | ICD-10-CM | POA: Diagnosis not present

## 2016-11-08 DIAGNOSIS — I10 Essential (primary) hypertension: Secondary | ICD-10-CM | POA: Diagnosis not present

## 2016-11-08 DIAGNOSIS — I4510 Unspecified right bundle-branch block: Secondary | ICD-10-CM

## 2016-11-08 DIAGNOSIS — I351 Nonrheumatic aortic (valve) insufficiency: Secondary | ICD-10-CM | POA: Diagnosis not present

## 2016-11-08 DIAGNOSIS — I2510 Atherosclerotic heart disease of native coronary artery without angina pectoris: Secondary | ICD-10-CM | POA: Diagnosis not present

## 2016-11-08 NOTE — Patient Instructions (Signed)
Medication Instructions:  No changes  Please discuss with family regarding loop recorder being removed. If needed we can schedule you to speak with Dr. Caryl Comes about this.    Follow-Up: Your physician wants you to follow-up in: 6 months. You will receive a reminder letter in the mail two months in advance. If you don't receive a letter, please call our office to schedule the follow-up appointment.  It was a pleasure seeing you today here in the office. Please do not hesitate to give [place] a call back if you have any further questions. [place], BSN

## 2016-11-08 NOTE — Progress Notes (Signed)
Cardiology Office Note   Date:  11/08/2016   ID:  Austin [name], DOB 08/18/1930, MRN [id number]  Referring Doctor:  Venia Carbon, MD   Cardiologist:   Wende Bushy, MD   Reason for consultation:  Chief Complaint  Patient presents with  . OTHER    6 month f/u  Pt's nurse has questions from family. Meds reviewed verbally with pt.      History of Present Illness: Austin [name] is a 83 y.o. male who presents for Follow-up for paroxysmal atrial fibrillation.   Review of medical records: Patient used to see a Dr. Posey Pronto in [place] for cardiology care. He has a diagnosis of paroxysmal atrial fibrillation, right bundle branch block, and hypertension. In terms of the atrial fibrillation, per patient and daughter reports, and review of extensive medical records, it appears that this may been diagnosed sometime in July 2014. He was placed on Eliquis at that time. A 30 day monitor did not reveal any recurrence of atrial fibrillation and hence eliquis was discontinued. Patient did not recall any intolerance or side effects from eliquis. He then had a reveal loop recorder device placed at that time.  In 08/10/2015, he had a recurrence of atrial fibrillation while he was in the hospital, which was thought to be related after use of an albuterol inhaler. He had cardioversions done at that time. He was also subsequently placed on amiodarone. However, he developed subsequent gait instability and falls. These were thought to be related to amiodarone use and therefore amiodarone was eventually discontinued.  Since last visit, patient continues to do his daily exercise of walking approximately 1 mile every day. No chest pains or shortness of breath. No problems with gait. No recent falls.  In terms of hyperlipidemia, when we discussed the plan from last visit of him wanting to do dietary changes first, he admits to forgetting about that discussion. He has not be adjusted his diet. He continues to  eat eggs and bacon daily.  Patient does not have any lightheadedness or syncope. No PND, orthopnea, edema. No chest pain. No shortness breath.   ROS:  Please see the history of present illness. Aside from mentioned under HPI, all other systems are reviewed and negative.    Past Medical History:  Diagnosis Date  . Alzheimer's dementia    ALZHEIMERS  . Atrial fibrillation [facility])    REVEAL DEVICE MEDTRONIC SERIAL #[id number] S MODEL #[id number]  . Coronary atherosclerosis of native coronary artery    MI 2014  . GERD (gastroesophageal reflux disease)    REFLUX  . Gout   . History of prostate cancer   . HTN (hypertension)   . LPRD (laryngopharyngeal reflux disease)   . Osteoarthritis, generalized    DJD neck and spine  . RBBB   . RBBB (right bundle branch block)     Past Surgical History:  Procedure Laterality Date  . ABDOMINAL ADHESION SURGERY    . APPENDECTOMY    . DIRECT LARYNGOSCOPY Left 04/19/2016   Procedure: DIRECT MICROSCOPIC LARYNGOSCOPY WITH BIOPSY;  Surgeon: Margaretha Sheffield, MD;  Location: [facility];  Service: ENT;  Laterality: Left;  . EP IMPLANTABLE DEVICE     Reveal device Medtronic Serial [id number]  Model [id number]  . INGUINAL HERNIA REPAIR Left   . KNEE SURGERY    . PROSTATECTOMY    . SHOULDER ARTHROSCOPY    . TREATMENT FISTULA ANAL       reports that he has never smoked. He has never used smokeless  tobacco. He reports that he does not drink alcohol or use drugs.   family history includes Chronic Renal Failure in his mother; Hypertension in his mother.   Outpatient Medications Prior to Visit  Medication Sig Dispense Refill  . acetaminophen (TYLENOL) 500 MG tablet Take 1 tablet by mouth every 6 (six) hours as needed.    Marland Kitchen apixaban (ELIQUIS) 5 MG TABS tablet Take 1 tablet (5 mg total) by mouth 2 (two) times daily. 60 tablet 6  . aspirin EC 81 MG tablet Take 1 tablet by mouth daily.    . calcium carbonate (TUMS EX) 750 MG chewable tablet Chew 2 tablets by  mouth daily as needed.     . Cholecalciferol (VITAMIN D3) 2000 units capsule Take 1 capsule by mouth daily.     . citalopram (CELEXA) 10 MG tablet Take 5 mg by mouth daily. 9 PM    . Coenzyme Q10 (COQ10) 200 MG CAPS Take by mouth.    . diazepam (VALIUM) 5 MG tablet Take 1 tablet (5 mg total) by mouth every 8 (eight) hours as needed for anxiety. 30 tablet 0  . docusate sodium (COLACE) 100 MG capsule Take 1 capsule by mouth at bedtime.    . donepezil (ARICEPT) 10 MG tablet Take 1 tablet by mouth at bedtime.    Marland Kitchen ketorolac (TORADOL) 10 MG tablet Take 1 tablet (10 mg total) by mouth every 8 (eight) hours as needed for moderate pain (with food). 10 tablet 0  . Magnesium Oxide 400 (240 Mg) MG TABS Take 1 tablet by mouth 2 (two) times daily.    . meclizine (ANTIVERT) 25 MG tablet Take 1 tablet (25 mg total) by mouth 3 (three) times daily as needed for dizziness or nausea. 30 tablet 1  . Multiple Vitamin (MULTIVITAMIN WITH MINERALS) TABS tablet Take 1 tablet by mouth daily.    . Naproxen Sodium (ALEVE) 220 MG CAPS Take 1 capsule by mouth daily.    . ranitidine (ZANTAC) 150 MG tablet Take 1 tablet by mouth at bedtime.    Marland Kitchen telmisartan (MICARDIS) 40 MG tablet Take 40 mg by mouth 2 (two) times daily at 10 AM and 5 PM.    . TURMERIC PO Take 1 tablet by mouth every evening.    . vitamin B-12 (CYANOCOBALAMIN) 1000 MCG tablet Take 1,000 mcg by mouth daily.     No facility-administered medications prior to visit.      Allergies: Amiodarone; Multaq [dronedarone]; and Albuterol    PHYSICAL EXAM: VS:  BP 124/62 (BP Location: Left Arm, Patient Position: Sitting, Cuff Size: Normal)   Pulse 75   Ht 5\' 4"  (1.626 m)   Wt 147 lb (66.7 kg)   BMI 25.23 kg/m  , Body mass index is 25.23 kg/m. Wt Readings from Last 3 Encounters:  11/08/16 147 lb (66.7 kg)  10/17/16 149 lb (67.6 kg)  10/13/16 151 lb (68.5 kg)    PHYSICAL EXAM: VS:  BP 124/62 (BP Location: Left Arm, Patient Position: Sitting, Cuff Size: Normal)    Pulse 75   Ht 5\' 4"  (1.626 m)   Wt 147 lb (66.7 kg)   BMI 25.23 kg/m  , BMI Body mass index is 25.23 kg/m. GENERAL:  well developed, well nourished, not in acute distress HEENT: normocephalic, pink conjunctivae, anicteric sclerae, no xanthelasma, normal dentition, oropharynx clear NECK:  no neck vein engorgement, JVP normal, no hepatojugular reflux, carotid upstroke brisk and symmetric, no bruit, no thyromegaly, no lymphadenopathy LUNGS:  good respiratory effort, clear to  auscultation bilaterally CV:  PMI not displaced, no thrills, no lifts, S1 and S2 within normal limits, no palpable S3 or S4, no murmurs, no rubs, no gallops ABD:  Soft, nontender, nondistended, normoactive bowel sounds, no abdominal aortic bruit, no hepatomegaly, no splenomegaly MS: nontender back, no kyphosis, no scoliosis, no joint deformities EXT:  2+ DP/PT pulses, no edema, no varicosities, no cyanosis, no clubbing SKIN: warm, nondiaphoretic, normal turgor, no ulcers NEUROPSYCH: alert, oriented to person, place, and time, sensory/motor grossly intact, normal mood, appropriate affect    Recent Labs: 08/07/2016: ALT 22; BUN 22; Creatinine, Ser 0.68; Hemoglobin 13.2; Platelets 132; Potassium 3.6; Sodium 136   Lipid Panel    Component Value Date/Time   CHOL 178 05/15/2016 0857   TRIG 106 05/15/2016 0857   HDL 53 05/15/2016 0857   CHOLHDL 3.4 05/15/2016 0857   LDLCALC 104 (H) 05/15/2016 0857     Other studies Reviewed:  EKG:  The ekg from 02/22/2016 was personally reviewed by me and it revealed sinus rhythm, 60 BPM. Sinus arrhythmia. Right bundle branch block.  EKG from 05/30/2016 was personally reviewed by me and it revealed sinus rhythm 60 BPM. Right bundle branch block.  Additional studies/ records that were reviewed personally reviewed by me today include:  Exercise nuclear stress test 11/19/2012: Normal study. EF 65%.  Echo 11/17/2012: EF greater than 55%. Grade 1 diastolic dysfunction. Trace MR.  Mild AI. PHT of 914.7 ms. RV systolic pressure is normal.  Holter monitor 10/30/2012:   asymptomatic bradycardia. Minimal heart rate 36 BPM during sleeping hours. Maximum heart rate 95 BPM. Average heart rate 59 BPM. Isolated PACs and PVCs, less than 3% burden. No sustained arrhythmias or conduction abnormalities. No symptoms reported.  Reveal device implanted 02/04/2013: November 2016: 1 AF episode lasted 20 minutes 08/19/2014: 2 episodes AF, longest one hour and 40 minutes.  Nuclear stress is 08/05/2015, [facility]: Normal cardiac perfusion imaging. EF 63%.  Echocardiogram 05/15/2016: Left ventricle: The cavity size was normal. There was mild focal   basal hypertrophy of the septum. Systolic function was normal.   The estimated ejection fraction was in the range of 50% to 55%.   Regional wall motion abnormalities cannot be excluded. Doppler   parameters are consistent with abnormal left ventricular   relaxation (grade 1 diastolic dysfunction). - Aortic valve: Trileaflet; mildly thickened leaflets. There was   mild regurgitation. - Left atrium: The atrium was mildly dilated.   ASSESSMENT AND PLAN: Atrial fibrillation Likely paroxysmal. Currently in sinus rhythm. Patient is tolerating [place]. Continue Eliquis. CHADS2-VASc=3. He had significant intolerance to amiodarone. The loop recorder battery status was RRT since 09/06/2016. As far as indication for placement of the loop recorder, it was more most likely just to follow-up on the atrial fibrillation. Patient has had no syncope. Patient was advised to discuss with family whether he wants it taken out or not. Patient then can follow-up with Dr. Caryl Comes for this.  Right bundle branch block Known from previous EKGs No change and recommendation from last visit 05/30/2016 No evidence of ischemia from recent stress testing February 2017.   Hypertension BP is well controlled. Continue monitoring BP. Continue current medical therapy and  lifestyle changes.   History of CAD Per patient and daughter, he was told he had a mild heart attack LVEF is within normal limits. Patient is doing daily walks, no symptoms with this. No chest pain or shortness of breath. Continue medical therapy. We have rediscussed the issue with the hyperlipidemia. Patient will be following  up with PCP soon and this can be discussed again. Ideal LDL goal is less than 70. Per last visit, patient was leaning towards dietary modification to address hyperlipidemia as he did not want to add any more medications.  Aortic insufficiency Mild. No appreciable murmur. Continue serial evaluation.  Current medicines are reviewed at length with the patient today.  The patient does not have concerns regarding medicines.  Labs/ tests ordered today include:  Orders Placed This Encounter  Procedures  . EKG 12-Lead    I had a lengthy and detailed discussion with the patient regarding diagnoses, prognosis, diagnostic options, treatment options , and side effects of medications.   I counseled the patient on importance of lifestyle modification including heart healthy diet, regular physical activity .   Disposition:   FU with Cardiology in 6 months  Signed, Wende Bushy, MD  11/08/2016 10:17 AM    [place]  This note was generated in part with voice recognition software and I apologize for any typographical errors that were not detected and corrected.

## 2016-11-09 ENCOUNTER — Encounter: Payer: Self-pay | Admitting: Internal Medicine

## 2016-11-11 LAB — CUP PACEART REMOTE DEVICE CHECK
Date Time Interrogation Session: 20180429140607
MDC IDC PG IMPLANT DT: 20140806

## 2016-11-11 NOTE — Progress Notes (Signed)
[facility] summary report received. Battery status RRT on 09/06/16, known, pt/pts dtr to discuss at appt on 5/15 per note. Normal device function. No new symptom episodes, tachy episodes, brady, or pause episodes. No new AF episodes. Monthly summary reports and ROV/PRN

## 2016-11-13 ENCOUNTER — Ambulatory Visit: Payer: Medicare Other | Admitting: Cardiology

## 2016-11-29 ENCOUNTER — Encounter: Payer: Self-pay | Admitting: Internal Medicine

## 2016-11-29 ENCOUNTER — Non-Acute Institutional Stay: Payer: Medicare Other | Admitting: Internal Medicine

## 2016-11-29 VITALS — BP 130/60 | HR 82 | Wt 149.8 lb

## 2016-11-29 DIAGNOSIS — G301 Alzheimer's disease with late onset: Secondary | ICD-10-CM | POA: Diagnosis not present

## 2016-11-29 DIAGNOSIS — K219 Gastro-esophageal reflux disease without esophagitis: Secondary | ICD-10-CM | POA: Diagnosis not present

## 2016-11-29 DIAGNOSIS — M542 Cervicalgia: Secondary | ICD-10-CM

## 2016-11-29 DIAGNOSIS — M5412 Radiculopathy, cervical region: Secondary | ICD-10-CM | POA: Insufficient documentation

## 2016-11-29 DIAGNOSIS — I10 Essential (primary) hypertension: Secondary | ICD-10-CM

## 2016-11-29 DIAGNOSIS — I480 Paroxysmal atrial fibrillation: Secondary | ICD-10-CM

## 2016-11-29 DIAGNOSIS — F39 Unspecified mood [affective] disorder: Secondary | ICD-10-CM | POA: Diagnosis not present

## 2016-11-29 DIAGNOSIS — F0280 Dementia in other diseases classified elsewhere without behavioral disturbance: Secondary | ICD-10-CM

## 2016-11-29 MED ORDER — DICLOFENAC SODIUM 1 % TD CREA: 2.0000 g | TOPICAL_CREAM | Freq: Three times a day (TID) | TRANSDERMAL | 11 refills | Status: DC | PRN

## 2016-11-29 MED ORDER — DIAZEPAM 5 MG PO TABS: 2.5000 mg | ORAL_TABLET | Freq: Two times a day (BID) | ORAL | 0 refills | Status: AC | PRN

## 2016-11-29 NOTE — Assessment & Plan Note (Signed)
And low back Has been severe at times---some better now with Rx Has appt with physiatrist Will try diclofenac cream (to decrease symptoms and use of oral meds). If he does well with this, may want to set up bid regularly Decrease diazepam dose--high risk

## 2016-11-29 NOTE — Assessment & Plan Note (Signed)
Seems to have adjusted here well now Will stop the citalopram

## 2016-11-29 NOTE — Progress Notes (Signed)
Subjective:    Patient ID: Austin [name], male    DOB: 11/04/1930, 83 y.o.   MRN: [id number]  HPI Visit for follow up of chronic health conditions Reviewed status with Luellen Pucker RN  Ongoing issues with his neck and back ER visits and now some relief with the toradol and diazepam (not using very often) Still walks regularly and works out Sleeps okay  Ongoing mild memory problems No confusion or getting lost Continues to be independent with ADLs  No chest pain No palpitations No SOB No edema No dizziness or syncope  Has adjusted here fully No depression or anxiety Very active and happy here now  Current Outpatient Prescriptions on File Prior to Visit  Medication Sig Dispense Refill  . acetaminophen (TYLENOL) 500 MG tablet Take 1 tablet by mouth every 6 (six) hours as needed.    Marland Kitchen apixaban (ELIQUIS) 5 MG TABS tablet Take 1 tablet (5 mg total) by mouth 2 (two) times daily. 60 tablet 6  . aspirin EC 81 MG tablet Take 1 tablet by mouth daily.    . calcium carbonate (TUMS EX) 750 MG chewable tablet Chew 2 tablets by mouth daily as needed.     . Cholecalciferol (VITAMIN D3) 2000 units capsule Take 1 capsule by mouth daily.     . citalopram (CELEXA) 10 MG tablet Take 5 mg by mouth daily. 9 PM    . Coenzyme Q10 (COQ10) 200 MG CAPS Take by mouth.    . donepezil (ARICEPT) 10 MG tablet Take 1 tablet by mouth at bedtime.    Marland Kitchen ketorolac (TORADOL) 10 MG tablet Take 1 tablet (10 mg total) by mouth every 8 (eight) hours as needed for moderate pain (with food). 10 tablet 0  . Magnesium Oxide 400 (240 Mg) MG TABS Take 1 tablet by mouth 2 (two) times daily.    . meclizine (ANTIVERT) 25 MG tablet Take 1 tablet (25 mg total) by mouth 3 (three) times daily as needed for dizziness or nausea. 30 tablet 1  . Multiple Vitamin (MULTIVITAMIN WITH MINERALS) TABS tablet Take 1 tablet by mouth daily.    . ranitidine (ZANTAC) 150 MG tablet Take 1 tablet by mouth at bedtime.    Marland Kitchen telmisartan (MICARDIS) 40 MG  tablet Take 40 mg by mouth 2 (two) times daily at 10 AM and 5 PM.    . TURMERIC PO Take 1 tablet by mouth every evening.    . vitamin B-12 (CYANOCOBALAMIN) 1000 MCG tablet Take 1,000 mcg by mouth daily.     No current facility-administered medications on file prior to visit.     Allergies  Allergen Reactions  . Amiodarone Other (See Comments)    Trouble waking/falls  . Multaq [Dronedarone] Other (See Comments)    unknown  . Albuterol Palpitations    Past Medical History:  Diagnosis Date  . Alzheimer's dementia    ALZHEIMERS  . Atrial fibrillation [facility])    REVEAL DEVICE MEDTRONIC SERIAL #[id number] S MODEL #[id number]  . Coronary atherosclerosis of native coronary artery    MI 2014  . GERD (gastroesophageal reflux disease)    REFLUX  . Gout   . History of prostate cancer   . HTN (hypertension)   . LPRD (laryngopharyngeal reflux disease)   . Osteoarthritis, generalized    DJD neck and spine  . RBBB   . RBBB (right bundle branch block)     Past Surgical History:  Procedure Laterality Date  . ABDOMINAL ADHESION SURGERY    . APPENDECTOMY    .  DIRECT LARYNGOSCOPY Left 04/19/2016   Procedure: DIRECT MICROSCOPIC LARYNGOSCOPY WITH BIOPSY;  Surgeon: Margaretha Sheffield, MD;  Location: [facility];  Service: ENT;  Laterality: Left;  . EP IMPLANTABLE DEVICE     Reveal device Medtronic Serial [id number]  Model [id number]  . INGUINAL HERNIA REPAIR Left   . KNEE SURGERY    . PROSTATECTOMY    . SHOULDER ARTHROSCOPY    . TREATMENT FISTULA ANAL      Family History  Problem Relation Age of Onset  . Hypertension Mother   . Chronic Renal Failure Mother   . Cancer Neg Hx   . Heart disease Neg Hx   . Diabetes Neg Hx   . Prostate cancer Neg Hx     Social History   Social History  . Marital status: Widowed    Spouse name: N/A  . Number of children: 3  . Years of education: N/A   Occupational History  . 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 Smoking status: Never Smoker  . Smokeless tobacco: Never Used  . Alcohol use No     Comment: 1 martini/ day  . Drug use: No  . Sexual activity: Not on file   Other Topics Concern  . Not on file   Social History Narrative   Has living will   All three children are health care POA   Would accept resuscitation attempts   No tube feeds if cognitively unaware   Review of Systems  Appetite is good Weight stable Bowels are good No urinary problems     Objective:   Physical Exam  Constitutional: He appears well-nourished. No distress.  Neck: No thyromegaly present.  Cardiovascular: Normal rate, regular rhythm and normal heart sounds.  Exam reveals no gallop.   No murmur heard. Pulmonary/Chest: Effort normal and breath sounds normal. No respiratory distress. He has no wheezes. He has no rales.  Musculoskeletal: He exhibits no edema.  Mild tenderness over upper cervical vertebrae and lumbar. Fair ROM  Lymphadenopathy:    He has no cervical adenopathy.  Skin: No rash noted.  Psychiatric: He has a normal mood and affect. His behavior is normal.          Assessment & Plan:

## 2016-11-29 NOTE — Assessment & Plan Note (Signed)
Still regular On the eliquis

## 2016-11-29 NOTE — Assessment & Plan Note (Signed)
No problems on the ranitidine

## 2016-11-29 NOTE — Assessment & Plan Note (Signed)
BP Readings from Last 3 Encounters:  11/29/16 130/60  11/08/16 124/62  10/17/16 138/64   Good control No change needed in ARB

## 2016-11-29 NOTE — Assessment & Plan Note (Signed)
Still mild No functional decline

## 2016-12-10 ENCOUNTER — Encounter: Payer: Self-pay | Admitting: Internal Medicine

## 2016-12-17 DIAGNOSIS — I214 Non-ST elevation (NSTEMI) myocardial infarction: Secondary | ICD-10-CM | POA: Insufficient documentation

## 2016-12-26 ENCOUNTER — Non-Acute Institutional Stay: Payer: Medicare Other | Admitting: Internal Medicine

## 2016-12-26 VITALS — BP 102/58 | HR 67 | Resp 16

## 2016-12-26 DIAGNOSIS — I482 Chronic atrial fibrillation, unspecified: Secondary | ICD-10-CM

## 2016-12-27 ENCOUNTER — Encounter: Payer: Self-pay | Admitting: Internal Medicine

## 2016-12-27 MED ORDER — METOPROLOL SUCCINATE ER 50 MG PO TB24: 50.0000 mg | ORAL_TABLET | Freq: Every day | ORAL | 3 refills | Status: DC

## 2016-12-27 NOTE — Patient Instructions (Signed)

## 2016-12-27 NOTE — Progress Notes (Signed)
Subjective:    Patient ID: Austin [name], male    DOB: 1931/01/14, 83 y.o.   MRN: [id number]  HPI  Asked to see pt in ALF. Was out of town at [employer] last week. Had an episode of Afib with RVR. Presented to [facility] ED. They were not able to control him with medication alone. He had to be cardioverted which was successful. His Micardis was stopped. He was started on Metoprolol ER and was continued on Eliquis. He was discharged and able to return to his vacation at the beach, at then back to ALF. Since discharge, he reports he has been feeling well. He denies feeling like his heart is racing. He denies weakness, chest pain or SOB. He is currently taking Metoprolol and Eliquis.  Review of Systems  Past Medical History:  Diagnosis Date  . Alzheimer's dementia    ALZHEIMERS  . Atrial fibrillation [facility])    REVEAL DEVICE MEDTRONIC SERIAL #[id number] S MODEL #[id number]  . Coronary atherosclerosis of native coronary artery    MI 2014  . GERD (gastroesophageal reflux disease)    REFLUX  . Gout   . History of prostate cancer   . HTN (hypertension)   . LPRD (laryngopharyngeal reflux disease)   . Osteoarthritis, generalized    DJD neck and spine  . RBBB   . RBBB (right bundle branch block)     Current Outpatient Prescriptions  Medication Sig Dispense Refill  . acetaminophen (TYLENOL) 500 MG tablet Take 1 tablet by mouth every 6 (six) hours as needed.    Marland Kitchen apixaban (ELIQUIS) 5 MG TABS tablet Take 1 tablet (5 mg total) by mouth 2 (two) times daily. 60 tablet 6  . aspirin EC 81 MG tablet Take 1 tablet by mouth daily.    . calcium carbonate (TUMS EX) 750 MG chewable tablet Chew 2 tablets by mouth daily as needed.     . Cholecalciferol (VITAMIN D3) 2000 units capsule Take 1 capsule by mouth daily.     . Coenzyme Q10 (COQ10) 200 MG CAPS Take by mouth.    . diazepam (VALIUM) 5 MG tablet Take 0.5 tablets (2.5 mg total) by mouth 2 (two) times daily as needed for anxiety. 1 tablet 0  . Diclofenac  Sodium 1 % CREA Place 2 g onto the skin 3 (three) times daily as needed. Neck and low back 120 g 11  . docusate sodium (COLACE) 100 MG capsule Take 100 mg by mouth daily.    Marland Kitchen donepezil (ARICEPT) 10 MG tablet Take 1 tablet by mouth at bedtime.    Marland Kitchen ketorolac (TORADOL) 10 MG tablet Take 1 tablet (10 mg total) by mouth every 8 (eight) hours as needed for moderate pain (with food). 10 tablet 0  . Magnesium Oxide 400 (240 Mg) MG TABS Take 1 tablet by mouth 2 (two) times daily.    . meclizine (ANTIVERT) 25 MG tablet Take 1 tablet (25 mg total) by mouth 3 (three) times daily as needed for dizziness or nausea. 30 tablet 1  . Multiple Vitamin (MULTIVITAMIN WITH MINERALS) TABS tablet Take 1 tablet by mouth daily.    . ranitidine (ZANTAC) 150 MG tablet Take 1 tablet by mouth at bedtime.    Marland Kitchen telmisartan (MICARDIS) 40 MG tablet Take 40 mg by mouth 2 (two) times daily at 10 AM and 5 PM.    . TURMERIC PO Take 1 tablet by mouth every evening.    . vitamin B-12 (CYANOCOBALAMIN) 1000 MCG tablet Take 1,000 mcg by  mouth daily.     No current facility-administered medications for this visit.     Allergies  Allergen Reactions  . Amiodarone Other (See Comments)    Trouble waking/falls  . Multaq [Dronedarone] Other (See Comments)    unknown  . Albuterol Palpitations    Family History  Problem Relation Age of Onset  . Hypertension Mother   . Chronic Renal Failure Mother   . Cancer Neg Hx   . Heart disease Neg Hx   . Diabetes Neg Hx   . Prostate cancer Neg Hx     Social History   Social History  . Marital status: Widowed    Spouse name: N/A  . Number of children: 3  . Years of education: N/A   Occupational History  . 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 Smoking status: Never Smoker  . Smokeless tobacco: Never Used  . Alcohol use No     Comment: 1 martini/ day  . Drug use: No  . Sexual activity: Not on file   Other Topics Concern  . Not on file    Social History Narrative   Has living will   All three children are health care POA   Would accept resuscitation attempts   No tube feeds if cognitively unaware     Constitutional: Denies fever, malaise, fatigue, headache or abrupt weight changes.  Respiratory: Denies difficulty breathing, shortness of breath, cough or sputum production.   Cardiovascular: Denies chest pain, chest tightness, palpitations or swelling in the hands or feet.   No other specific complaints in a complete review of systems (except as listed in HPI above).     Objective:   Physical Exam   BP (!) 102/58   Pulse 67   Resp 16  Wt Readings from Last 3 Encounters:  11/29/16 149 lb 12.8 oz (67.9 kg)  11/08/16 147 lb (66.7 kg)  10/17/16 149 lb (67.6 kg)    General: Appears his stated age, in NAD.  Cardiovascular: Normal rate and rhythm.  Pulmonary/Chest: Normal effort and positive vesicular breath sounds. No respiratory distress. No wheezes, rales or ronchi noted.    BMET    Component Value Date/Time   NA 136 08/07/2016 0926   NA 144 05/15/2016 0857   K 3.6 08/07/2016 0926   CL 103 08/07/2016 0926   CO2 27 08/07/2016 0926   GLUCOSE 108 (H) 08/07/2016 0926   BUN 22 (H) 08/07/2016 0926   BUN 20 05/15/2016 0857   CREATININE 0.68 08/07/2016 0926   CALCIUM 9.3 08/07/2016 0926   GFRNONAA >60 08/07/2016 0926   GFRAA >60 08/07/2016 0926    Lipid Panel     Component Value Date/Time   CHOL 178 05/15/2016 0857   TRIG 106 05/15/2016 0857   HDL 53 05/15/2016 0857   CHOLHDL 3.4 05/15/2016 0857   LDLCALC 104 (H) 05/15/2016 0857    CBC    Component Value Date/Time   WBC 5.8 08/07/2016 0926   RBC 4.34 (L) 08/07/2016 0926   HGB 13.2 08/07/2016 0926   HCT 38.1 (L) 08/07/2016 0926   PLT 132 (L) 08/07/2016 0926   MCV 87.8 08/07/2016 0926   MCH 30.5 08/07/2016 0926   MCHC 34.7 08/07/2016 0926   RDW 15.0 (H) 08/07/2016 0926   LYMPHSABS 1.3 08/07/2016 0926   MONOABS 0.7 08/07/2016 0926   EOSABS  0.1 08/07/2016 0926   BASOSABS 0.0 08/07/2016 0926    Hgb A1C No results found  for: HGBA1C         Assessment & Plan:   Hospital Follow up for Afib:  Unable to review hospital records He seems to be doing okay now No medication changes at this time Will monitor  Will reassess as needed Webb Silversmith, NP

## 2017-01-16 ENCOUNTER — Non-Acute Institutional Stay: Payer: Medicare Other | Admitting: Internal Medicine

## 2017-01-16 VITALS — BP 130/78 | HR 66

## 2017-01-16 DIAGNOSIS — R069 Unspecified abnormalities of breathing: Secondary | ICD-10-CM | POA: Diagnosis not present

## 2017-01-16 DIAGNOSIS — R0989 Other specified symptoms and signs involving the circulatory and respiratory systems: Secondary | ICD-10-CM

## 2017-01-17 ENCOUNTER — Encounter: Payer: Self-pay | Admitting: Internal Medicine

## 2017-01-17 NOTE — Progress Notes (Signed)
Subjective:    Patient ID: Austin [name], male    DOB: 1930-10-28, 83 y.o.   MRN: [id number]  HPI  Asked to see pt in ALF C/o inability to take a deep breath No real shortness of breath, chest pain or chest tightness Notices it more often at rest, not with exertion Hx of Afib, on Metoprolol and Eliquis  Review of Systems      Past Medical History:  Diagnosis Date  . Alzheimer's dementia    ALZHEIMERS  . Atrial fibrillation [facility])    REVEAL DEVICE MEDTRONIC SERIAL #[id number] S MODEL #[id number]  . Coronary atherosclerosis of native coronary artery    MI 2014  . GERD (gastroesophageal reflux disease)    REFLUX  . Gout   . History of prostate cancer   . HTN (hypertension)   . LPRD (laryngopharyngeal reflux disease)   . Osteoarthritis, generalized    DJD neck and spine  . RBBB   . RBBB (right bundle branch block)     Current Outpatient Prescriptions  Medication Sig Dispense Refill  . acetaminophen (TYLENOL) 500 MG tablet Take 1 tablet by mouth every 6 (six) hours as needed.    Marland Kitchen apixaban (ELIQUIS) 5 MG TABS tablet Take 1 tablet (5 mg total) by mouth 2 (two) times daily. 60 tablet 6  . aspirin EC 81 MG tablet Take 1 tablet by mouth daily.    . calcium carbonate (TUMS EX) 750 MG chewable tablet Chew 2 tablets by mouth daily as needed.     . Cholecalciferol (VITAMIN D3) 2000 units capsule Take 1 capsule by mouth daily.     . Coenzyme Q10 (COQ10) 200 MG CAPS Take by mouth.    . diazepam (VALIUM) 5 MG tablet Take 0.5 tablets (2.5 mg total) by mouth 2 (two) times daily as needed for anxiety. 1 tablet 0  . Diclofenac Sodium 1 % CREA Place 2 g onto the skin 3 (three) times daily as needed. Neck and low back 120 g 11  . docusate sodium (COLACE) 100 MG capsule Take 100 mg by mouth daily.    Marland Kitchen donepezil (ARICEPT) 10 MG tablet Take 1 tablet by mouth at bedtime.    . Magnesium Oxide 400 (240 Mg) MG TABS Take 1 tablet by mouth 2 (two) times daily.    . metoprolol succinate (TOPROL-XL) 50  MG 24 hr tablet Take 1 tablet (50 mg total) by mouth daily. Take with or immediately following a meal. 90 tablet 3  . Multiple Vitamin (MULTIVITAMIN WITH MINERALS) TABS tablet Take 1 tablet by mouth daily.    . ranitidine (ZANTAC) 150 MG tablet Take 1 tablet by mouth at bedtime.    . TURMERIC PO Take 1 tablet by mouth every evening.    . vitamin B-12 (CYANOCOBALAMIN) 1000 MCG tablet Take 1,000 mcg by mouth daily.    . meclizine (ANTIVERT) 25 MG tablet Take 1 tablet (25 mg total) by mouth 3 (three) times daily as needed for dizziness or nausea. (Patient not taking: Reported on 01/17/2017) 30 tablet 1   No current facility-administered medications for this visit.     Allergies  Allergen Reactions  . Amiodarone Other (See Comments)    Trouble waking/falls  . Multaq [Dronedarone] Other (See Comments)    unknown  . Albuterol Palpitations    Family History  Problem Relation Age of Onset  . Hypertension Mother   . Chronic Renal Failure Mother   . Cancer Neg Hx   . Heart disease Neg Hx   .  Diabetes Neg Hx   . Prostate cancer Neg Hx     Social History   Social History  . Marital status: Widowed    Spouse name: N/A  . Number of children: 3  . Years of education: N/A   Occupational History  . 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 Smoking status: Never Smoker  . Smokeless tobacco: Never Used  . Alcohol use No     Comment: 1 martini/ day  . Drug use: No  . Sexual activity: Not on file   Other Topics Concern  . Not on file   Social History Narrative   Has living will   All three children are health care POA   Would accept resuscitation attempts   No tube feeds if cognitively unaware     Constitutional: Denies fever, malaise, fatigue, headache or abrupt weight changes.  Respiratory: Denies difficulty breathing, shortness of breath, cough or sputum production.   Cardiovascular: Denies chest pain, chest tightness, palpitations or  swelling in the hands or feet.   No other specific complaints in a complete review of systems (except as listed in HPI above).  Objective:   Physical Exam  BP 130/78   Pulse 66  Wt Readings from Last 3 Encounters:  11/29/16 149 lb 12.8 oz (67.9 kg)  11/08/16 147 lb (66.7 kg)  10/17/16 149 lb (67.6 kg)    General: Appears his stated age, in NAD. Cardiovascular: Normal rate and rhythm. S1,S2 noted.  Pulmonary/Chest: Normal effort and positive vesicular breath sounds. No respiratory distress. No wheezes, rales or ronchi noted.    BMET    Component Value Date/Time   NA 136 08/07/2016 0926   NA 144 05/15/2016 0857   K 3.6 08/07/2016 0926   CL 103 08/07/2016 0926   CO2 27 08/07/2016 0926   GLUCOSE 108 (H) 08/07/2016 0926   BUN 22 (H) 08/07/2016 0926   BUN 20 05/15/2016 0857   CREATININE 0.68 08/07/2016 0926   CALCIUM 9.3 08/07/2016 0926   GFRNONAA >60 08/07/2016 0926   GFRAA >60 08/07/2016 0926    Lipid Panel     Component Value Date/Time   CHOL 178 05/15/2016 0857   TRIG 106 05/15/2016 0857   HDL 53 05/15/2016 0857   CHOLHDL 3.4 05/15/2016 0857   LDLCALC 104 (H) 05/15/2016 0857    CBC    Component Value Date/Time   WBC 5.8 08/07/2016 0926   RBC 4.34 (L) 08/07/2016 0926   HGB 13.2 08/07/2016 0926   HCT 38.1 (L) 08/07/2016 0926   PLT 132 (L) 08/07/2016 0926   MCV 87.8 08/07/2016 0926   MCH 30.5 08/07/2016 0926   MCHC 34.7 08/07/2016 0926   RDW 15.0 (H) 08/07/2016 0926   LYMPHSABS 1.3 08/07/2016 0926   MONOABS 0.7 08/07/2016 0926   EOSABS 0.1 08/07/2016 0926   BASOSABS 0.0 08/07/2016 0926    Hgb A1C No results found for: HGBA1C          Assessment & Plan:   Inability to Take a Deep Breath:  Exam benign Afib seems controlled Will trial Albuterol inhaler to see if this helps If worsens, consider ECG vs chest xray vs Echo  Will reassess as needed Webb Silversmith, NP

## 2017-02-27 NOTE — Progress Notes (Signed)
Cardiology Office Note  Date:  02/28/2017   ID:  Demico Ploch, DOB 1931-01-01, MRN [id number]  PCP:  Venia Carbon, MD   Chief Complaint  Patient presents with  . other    New patient. Patient c/o SOB at night when lying down. Former Water engineer patient. Meds reviewed verbally with patient.     HPI:  Austin [name] is a 83 y.o. male with a hx of Alzheimer's dementia, paroxysmal atrial fibrillation, on anticoagulation hypertension Who presents for follow-up of his atrial fibrillation  Presents with his daughter Denies any tachycardia concerning for atrial fibrillation  Main complaint is shortness of breath Symptoms presenting in the past 4 to 5 days Wakes up from sleep, has trouble getting back to sleep secondary to shortness of breath  Goes for walks, stands up, puts his head up the window tries to breathe, feels he cannot do it  Even in the exam room today reports having breathing, feels that the air is getting stuck somewhere in his upper mediastinum, throat area  Denies orthopnea, PND, leg swelling, abdominal bloating, weight change Exercises on a regular basis at [employer] [place], walking around the track, no shortness of breath on exertion Denies any smoking history  Does not feel his shortness of breath is from tachycardia or palpitations, arrhythmia. Nurses monitor heart rate and blood pressure when he has these episodes, all appear normal  Reports having all up soon his throat, removed last year by ENT  Link download reviewed showing no significant arrhythmia/no atrial fibrillation  EKG personally reviewed by myself on todays visit Shows no sinus rhythm rate 64 bpm no significant ST or T-wave changes, right bundle branch block  Other past medical history reviewed diagnosed sometime in July 2014.  placed on Eliquis at that time.  A 30 day monitor did not reveal any recurrence of atrial fibrillation and hence eliquis was discontinued.  reveal loop recorder device placed at  that time.   In 08/10/2015, he had a recurrence of atrial fibrillation while he was in the hospital, which was thought to be related after use of an albuterol inhaler. He had cardioversion done at that time.  placed on amiodarone.  gait instability and falls. These were thought to be related to amiodarone use and therefore amiodarone was eventually discontinued.   PMH:   has a past medical history of Alzheimer's dementia; Atrial fibrillation ([place]); Coronary atherosclerosis of native coronary artery; GERD (gastroesophageal reflux disease); Gout; History of prostate cancer; HTN (hypertension); LPRD (laryngopharyngeal reflux disease); Osteoarthritis, generalized; RBBB; and RBBB (right bundle branch block).  PSH:    Past Surgical History:  Procedure Laterality Date  . ABDOMINAL ADHESION SURGERY    . APPENDECTOMY    . DIRECT LARYNGOSCOPY Left 04/19/2016   Procedure: DIRECT MICROSCOPIC LARYNGOSCOPY WITH BIOPSY;  Surgeon: Margaretha Sheffield, MD;  Location: [facility];  Service: ENT;  Laterality: Left;  . EP IMPLANTABLE DEVICE     Reveal device Medtronic Serial [id number]  Model [id number]  . INGUINAL HERNIA REPAIR Left   . KNEE SURGERY    . PROSTATECTOMY    . SHOULDER ARTHROSCOPY    . TREATMENT FISTULA ANAL      Current Outpatient Prescriptions  Medication Sig Dispense Refill  . acetaminophen (TYLENOL) 500 MG tablet Take 1 tablet by mouth every 6 (six) hours as needed.    Marland Kitchen apixaban (ELIQUIS) 5 MG TABS tablet Take 1 tablet (5 mg total) by mouth 2 (two) times daily. 60 tablet 6  . aspirin EC 81 MG  tablet Take 1 tablet by mouth daily.    . calcium carbonate (TUMS EX) 750 MG chewable tablet Chew 2 tablets by mouth daily as needed.     . Cholecalciferol (VITAMIN D3) 2000 units capsule Take 1 capsule by mouth daily.     . Coenzyme Q10 (COQ10) 200 MG CAPS Take by mouth.    . diazepam (VALIUM) 5 MG tablet Take 0.5 tablets (2.5 mg total) by mouth 2 (two) times daily as needed for anxiety. 1 tablet  0  . Diclofenac Sodium 1 % CREA Place 2 g onto the skin 3 (three) times daily as needed. Neck and low back 120 g 11  . docusate sodium (COLACE) 100 MG capsule Take 100 mg by mouth daily.    Marland Kitchen donepezil (ARICEPT) 10 MG tablet Take 1 tablet by mouth at bedtime.    . Magnesium Oxide 400 (240 Mg) MG TABS Take 1 tablet by mouth 2 (two) times daily.    . meclizine (ANTIVERT) 25 MG tablet Take 1 tablet (25 mg total) by mouth 3 (three) times daily as needed for dizziness or nausea. 30 tablet 1  . metoprolol succinate (TOPROL-XL) 50 MG 24 hr tablet Take 1 tablet (50 mg total) by mouth daily. Take with or immediately following a meal. 90 tablet 3  . Multiple Vitamin (MULTIVITAMIN WITH MINERALS) TABS tablet Take 1 tablet by mouth daily.    . ranitidine (ZANTAC) 150 MG tablet Take 1 tablet by mouth at bedtime.    . TURMERIC PO Take 1 tablet by mouth every evening.    . vitamin B-12 (CYANOCOBALAMIN) 1000 MCG tablet Take 1,000 mcg by mouth daily.     No current facility-administered medications for this visit.      Allergies:   Amiodarone; Multaq [dronedarone]; and Albuterol   Social History:  The patient  reports that he has never smoked. He has never used smokeless tobacco. He reports that he does not drink alcohol or use drugs.   Family History:   family history includes Chronic Renal Failure in his mother; Hypertension in his mother.    Review of Systems: Review of Systems  Constitutional: Negative.   Respiratory: Negative.   Cardiovascular: Negative.   Gastrointestinal: Negative.   Musculoskeletal: Negative.   Neurological: Negative.   Psychiatric/Behavioral: Negative.   All other systems reviewed and are negative.    PHYSICAL EXAM: VS:  BP 118/60 (BP Location: Left Arm, Patient Position: Sitting, Cuff Size: Normal)   Pulse 64   Ht 5\' 3"  (1.6 m)   Wt 148 lb 4 oz (67.2 kg)   BMI 26.26 kg/m  , BMI Body mass index is 26.26 kg/m. GEN: Well nourished, well developed, in no acute  distress  HEENT: normal  Neck: no JVD, carotid bruits, or masses Cardiac: RRR; no murmurs, rubs, or gallops,no edema  Respiratory:  clear to auscultation bilaterally, normal work of breathing GI: soft, nontender, nondistended, + BS MS: no deformity or atrophy  Skin: warm and dry, no rash Neuro:  Strength and sensation are intact Psych: euthymic mood, full affect    Recent Labs: 08/07/2016: ALT 22; BUN 22; Creatinine, Ser 0.68; Hemoglobin 13.2; Platelets 132; Potassium 3.6; Sodium 136    Lipid Panel Lab Results  Component Value Date   CHOL 178 05/15/2016   HDL 53 05/15/2016   LDLCALC 104 (H) 05/15/2016   TRIG 106 05/15/2016      Wt Readings from Last 3 Encounters:  02/28/17 148 lb 4 oz (67.2 kg)  11/29/16 149 lb  12.8 oz (67.9 kg)  11/08/16 147 lb (66.7 kg)       ASSESSMENT AND PLAN:  Paroxysmal atrial fibrillation ([place]) - Plan: EKG 12-Lead No recent episodes of atrial fibrillation by Link download Continue current medications  Mood disorder ([place]) - Plan: EKG 12-Lead  Late onset Alzheimer's disease without behavioral disturbance - Plan: EKG 12-Lead Manage by primary care, Baseline unclear  Essential hypertension - Plan: EKG 12-Lead Blood pressure is well controlled on today's visit. No changes made to the medications.  Shortness of breath Long discussion with him, etiology unclear Long period of time spent discussing various etiology of his shortness of breath Less likely congestive heart failure, less likely ischemia as he is exercising, less likely arrhythmia as heart rate normal when he has these episodes of shortness of breath likely today's visit in the exam room.  Unclear if he has obstructive airway disease, lungs clear on exam, Could consider PFTs though no prior smoking history Could follow-up with ENT for recurrent polyp Suggested incentive spirometry for now as he had what appeared to be atelectasis on previous x-ray and even on exam. Crackles at the  bases resolved with several deep inspirations but did not seem to help his symptomatic shortness of breath at rest sitting on the exam table.   Disposition:   F/U  6 months   Total encounter time more than 45 minutes  Greater than 50% was spent in counseling and coordination of care with the patient    Orders Placed This Encounter  Procedures  . EKG 12-Lead     Signed, Esmond Plants, M.D., Ph.D. 02/28/2017  [place], [place]

## 2017-02-28 ENCOUNTER — Encounter: Payer: Self-pay | Admitting: Cardiovascular Disease

## 2017-02-28 ENCOUNTER — Ambulatory Visit (INDEPENDENT_AMBULATORY_CARE_PROVIDER_SITE_OTHER): Payer: Medicare Other | Admitting: Cardiovascular Disease

## 2017-02-28 VITALS — BP 118/60 | HR 64 | Ht 63.0 in | Wt 148.2 lb

## 2017-02-28 DIAGNOSIS — F0280 Dementia in other diseases classified elsewhere without behavioral disturbance: Secondary | ICD-10-CM

## 2017-02-28 DIAGNOSIS — G301 Alzheimer's disease with late onset: Secondary | ICD-10-CM | POA: Diagnosis not present

## 2017-02-28 DIAGNOSIS — I10 Essential (primary) hypertension: Secondary | ICD-10-CM

## 2017-02-28 DIAGNOSIS — I480 Paroxysmal atrial fibrillation: Secondary | ICD-10-CM | POA: Diagnosis not present

## 2017-02-28 DIAGNOSIS — R0602 Shortness of breath: Secondary | ICD-10-CM | POA: Diagnosis not present

## 2017-02-28 DIAGNOSIS — F39 Unspecified mood [affective] disorder: Secondary | ICD-10-CM | POA: Diagnosis not present

## 2017-02-28 NOTE — Patient Instructions (Signed)

## 2017-03-14 ENCOUNTER — Telehealth: Payer: Self-pay | Admitting: Cardiovascular Disease

## 2017-03-14 NOTE — Telephone Encounter (Signed)
Okay to hold anticoagulation prior to procedure

## 2017-03-14 NOTE — Telephone Encounter (Signed)
Request for cardiac clearance received from [facility] to hold Eliquis for 48 hours prior to Epidural Steroid Injection. Please route clearance to Fax # [phone] to attention Dr. Barrie Folk.

## 2017-03-14 NOTE — Telephone Encounter (Signed)
Discussed with Dr. Rockey Situ and he is agreeable that patient can hold Eliquis for 48 hours prior to the upcoming Epidural Steroid injection. Routing clearance to number provided below. Please let us know if you need any further information.

## 2017-03-18 NOTE — Telephone Encounter (Signed)
Clearance routed to number provided.  

## 2017-03-29 ENCOUNTER — Ambulatory Visit: Payer: Medicare Other | Admitting: Internal Medicine

## 2017-03-29 DIAGNOSIS — G301 Alzheimer's disease with late onset: Secondary | ICD-10-CM

## 2017-03-29 DIAGNOSIS — I10 Essential (primary) hypertension: Secondary | ICD-10-CM

## 2017-03-29 DIAGNOSIS — I480 Paroxysmal atrial fibrillation: Secondary | ICD-10-CM | POA: Diagnosis not present

## 2017-03-29 DIAGNOSIS — I2510 Atherosclerotic heart disease of native coronary artery without angina pectoris: Secondary | ICD-10-CM

## 2017-03-29 DIAGNOSIS — F0280 Dementia in other diseases classified elsewhere without behavioral disturbance: Secondary | ICD-10-CM

## 2017-03-29 DIAGNOSIS — M159 Polyosteoarthritis, unspecified: Secondary | ICD-10-CM

## 2017-03-29 DIAGNOSIS — K219 Gastro-esophageal reflux disease without esophagitis: Secondary | ICD-10-CM | POA: Diagnosis not present

## 2017-04-01 ENCOUNTER — Encounter: Payer: Self-pay | Admitting: Internal Medicine

## 2017-04-01 NOTE — Progress Notes (Signed)
Subjective:    Patient ID: Austin [name], male    DOB: 09/25/1930, 83 y.o.   MRN: [id number]  HPI  Routine follow up for resident in [address] Reviewed status with Luellen Pucker, [place]. No concerns  He reports he has trouble sleeping but he thinks it is because of his neck pain. He can not get comfortable laying down. He reports the Tylenol does provide some relief. He walks without a walker. Independent with ADL's. Appetite has been good. He is exercising 3 x weeks. Walks without a walker. Bowels are okay. He denies urinary incontinence. He denies reflux or SOB.   Alzheimer's: Mild cognitive issues, stable on Aricept.  Afib: Chronic but controlled on Metoprolol, ASA and Eliquis. He denies chest pain, abnormal bleeding.  CAD: He denies angina.  GERD: Controlled on Zantac.  HTN: BP controlled on Metoprolol.  OA: Mainly in his neck. This is his biggest complaint. He reports he takes Tylenol and Valium with some relief. He is supposed to go to Dr. Harley Alto next week for an injection.  Review of Systems      Past Medical History:  Diagnosis Date  . Alzheimer's dementia    [place]  . Atrial fibrillation [facility])    REVEAL DEVICE MEDTRONIC SERIAL #[id number] S MODEL #[id number]  . Coronary atherosclerosis of native coronary artery    MI 2014  . GERD (gastroesophageal reflux disease)    REFLUX  . Gout   . History of prostate cancer   . HTN (hypertension)   . LPRD (laryngopharyngeal reflux disease)   . Osteoarthritis, generalized    DJD neck and spine  . RBBB   . RBBB (right bundle branch block)     Current Outpatient Prescriptions  Medication Sig Dispense Refill  . acetaminophen (TYLENOL) 500 MG tablet Take 1 tablet by mouth every 6 (six) hours as needed.    Marland Kitchen apixaban (ELIQUIS) 5 MG TABS tablet Take 1 tablet (5 mg total) by mouth 2 (two) times daily. 60 tablet 6  . aspirin EC 81 MG tablet Take 1 tablet by mouth daily.    . calcium carbonate (TUMS EX) 750 MG chewable tablet Chew 2  tablets by mouth daily as needed.     . Cholecalciferol (VITAMIN D3) 2000 units capsule Take 1 capsule by mouth daily.     . Coenzyme Q10 (COQ10) 200 MG CAPS Take by mouth.    . diazepam (VALIUM) 5 MG tablet Take 0.5 tablets (2.5 mg total) by mouth 2 (two) times daily as needed for anxiety. 1 tablet 0  . Diclofenac Sodium 1 % CREA Place 2 g onto the skin 3 (three) times daily as needed. Neck and low back 120 g 11  . docusate sodium (COLACE) 100 MG capsule Take 100 mg by mouth daily.    Marland Kitchen donepezil (ARICEPT) 10 MG tablet Take 1 tablet by mouth at bedtime.    . Magnesium Oxide 400 (240 Mg) MG TABS Take 1 tablet by mouth 2 (two) times daily.    . meclizine (ANTIVERT) 25 MG tablet Take 1 tablet (25 mg total) by mouth 3 (three) times daily as needed for dizziness or nausea. 30 tablet 1  . metoprolol succinate (TOPROL-XL) 50 MG 24 hr tablet Take 1 tablet (50 mg total) by mouth daily. Take with or immediately following a meal. 90 tablet 3  . Multiple Vitamin (MULTIVITAMIN WITH MINERALS) TABS tablet Take 1 tablet by mouth daily.    . ranitidine (ZANTAC) 150 MG tablet Take 1 tablet by mouth at bedtime.    Marland Kitchen  TURMERIC PO Take 1 tablet by mouth every evening.    . vitamin B-12 (CYANOCOBALAMIN) 1000 MCG tablet Take 1,000 mcg by mouth daily.     No current facility-administered medications for this visit.     Allergies  Allergen Reactions  . Amiodarone Other (See Comments)    Trouble waking/falls  . Multaq [Dronedarone] Other (See Comments)    unknown  . Albuterol Palpitations    Family History  Problem Relation Age of Onset  . Hypertension Mother   . Chronic Renal Failure Mother   . Cancer Neg Hx   . Heart disease Neg Hx   . Diabetes Neg Hx   . Prostate cancer Neg Hx     Social History   Social History  . Marital status: Widowed    Spouse name: N/A  . Number of children: 3  . Years of education: N/A   Occupational History  . Professor --Equities trader   Social  History Main Topics  . Smoking status: Never Smoker  . Smokeless tobacco: Never Used  . Alcohol use No     Comment: 1 martini/ day  . Drug use: No  . Sexual activity: Not on file   Other Topics Concern  . Not on file   Social History Narrative   Has living will   All three children are health care POA   Would accept resuscitation attempts   No tube feeds if cognitively unaware     Constitutional: Denies fever, malaise, fatigue, headache or abrupt weight changes.  HEENT: Denies eye pain, eye redness, ear pain, ringing in the ears, wax buildup, runny nose, nasal congestion, bloody nose, or sore throat. Respiratory: Denies difficulty breathing, shortness of breath, cough or sputum production.   Cardiovascular: Denies chest pain, chest tightness, palpitations or swelling in the hands or feet.  Gastrointestinal: Pt reports intermittent constipation. Denies abdominal pain, bloating, diarrhea or blood in the stool.  GU: Denies urgency, frequency, pain with urination, burning sensation, blood in urine, odor or discharge. Musculoskeletal: Pt reports neck pain. Denies decrease in range of motion, difficulty with gait, muscle pain or joint swelling.  Skin: Denies redness, rashes, lesions or ulcercations.  Neurological: pt reports decreased short term memory. Denies dizziness, difficulty with speech or problems with balance and coordination.  Psych: Denies anxiety, depression, SI/HI.  No other specific complaints in a complete review of systems (except as listed in HPI above).  Objective:   Physical Exam   BP 105/74   Pulse 62   Temp 99.2 F (37.3 C)   Resp 17   Wt 135 lb (61.2 kg)   BMI 23.91 kg/m  Wt Readings from Last 3 Encounters:  04/01/17 135 lb (61.2 kg)  02/28/17 148 lb 4 oz (67.2 kg)  11/29/16 149 lb 12.8 oz (67.9 kg)    General: Appears his stated age, in NAD. Neck:  Neck supple, trachea midline. No masses, lumps or thyromegaly present.  Cardiovascular: Normal rate  with irregular rhythm. No murmur, rubs or gallops noted.positive vesicular breath sounds. No respiratory distress. No wheezes, rales or ronchi noted.  Abdomen: Soft and nontender. Normal bowel sounds. No distention or masses noted.  Musculoskeletal: Gait slow but steady without assistance. Neurological: Alert and oriented.   Psychiatric: Mood and affect normal. Behavior is normal. Judgment and thought content normal.     BMET    Component Value Date/Time   NA 136 08/07/2016 0926   NA 144 05/15/2016 0857   K 3.6 08/07/2016  0926   CL 103 08/07/2016 0926   CO2 27 08/07/2016 0926   GLUCOSE 108 (H) 08/07/2016 0926   BUN 22 (H) 08/07/2016 0926   BUN 20 05/15/2016 0857   CREATININE 0.68 08/07/2016 0926   CALCIUM 9.3 08/07/2016 0926   GFRNONAA >60 08/07/2016 0926   GFRAA >60 08/07/2016 0926    Lipid Panel     Component Value Date/Time   CHOL 178 05/15/2016 0857   TRIG 106 05/15/2016 0857   HDL 53 05/15/2016 0857   CHOLHDL 3.4 05/15/2016 0857   LDLCALC 104 (H) 05/15/2016 0857    CBC    Component Value Date/Time   WBC 5.8 08/07/2016 0926   RBC 4.34 (L) 08/07/2016 0926   HGB 13.2 08/07/2016 0926   HCT 38.1 (L) 08/07/2016 0926   PLT 132 (L) 08/07/2016 0926   MCV 87.8 08/07/2016 0926   MCH 30.5 08/07/2016 0926   MCHC 34.7 08/07/2016 0926   RDW 15.0 (H) 08/07/2016 0926   LYMPHSABS 1.3 08/07/2016 0926   MONOABS 0.7 08/07/2016 0926   EOSABS 0.1 08/07/2016 0926   BASOSABS 0.0 08/07/2016 0926    Hgb A1C No results found for: HGBA1C         Assessment & Plan:

## 2017-04-01 NOTE — Assessment & Plan Note (Signed)
Stable Needs ALF assist Continue Aricept

## 2017-04-01 NOTE — Patient Instructions (Signed)
Fat and Cholesterol Restricted Diet Getting too much fat and cholesterol in your diet may cause health problems. Following this diet helps keep your fat and cholesterol at normal levels. This can keep you from getting sick. What types of fat should I choose?  Choose monosaturated and polyunsaturated fats. These are found in foods such as olive oil, canola oil, flaxseeds, walnuts, almonds, and seeds.  Eat more omega-3 fats. Good choices include salmon, mackerel, sardines, tuna, flaxseed oil, and ground flaxseeds.  Limit saturated fats. These are in animal products such as meats, butter, and cream. They can also be in plant products such as palm oil, palm kernel oil, and coconut oil.  Avoid foods with partially hydrogenated oils in them. These contain trans fats. Examples of foods that have trans fats are stick margarine, some tub margarines, cookies, crackers, and other baked goods. What general guidelines do I need to follow?  Check food labels. Look for the words "trans fat" and "saturated fat."  When preparing a meal: ? Fill half of your plate with vegetables and green salads. ? Fill one fourth of your plate with whole grains. Look for the word "whole" as the first word in the ingredient list. ? Fill one fourth of your plate with lean protein foods.  Eat more foods that have fiber, like apples, carrots, beans, peas, and barley.  Eat more home-cooked foods. Eat less at restaurants and buffets.  Limit or avoid alcohol.  Limit foods high in starch and sugar.  Limit fried foods.  Cook foods without frying them. Baking, boiling, grilling, and broiling are all great options.  Lose weight if you are overweight. Losing even a small amount of weight can help your overall health. It can also help prevent diseases such as diabetes and heart disease. What foods can I eat? Grains Whole grains, such as whole wheat or whole grain breads, crackers, cereals, and pasta. Unsweetened oatmeal,  bulgur, barley, quinoa, or brown rice. Corn or whole wheat flour tortillas. Vegetables Fresh or frozen vegetables (raw, steamed, roasted, or grilled). Green salads. Fruits All fresh, canned (in natural juice), or frozen fruits. Meat and Other Protein Products Ground beef (85% or leaner), grass-fed beef, or beef trimmed of fat. Skinless chicken or turkey. Ground chicken or turkey. Pork trimmed of fat. All fish and seafood. Eggs. Dried beans, peas, or lentils. Unsalted nuts or seeds. Unsalted canned or dry beans. Dairy Low-fat dairy products, such as skim or 1% milk, 2% or reduced-fat cheeses, low-fat ricotta or cottage cheese, or plain low-fat yogurt. Fats and Oils Tub margarines without trans fats. Light or reduced-fat mayonnaise and salad dressings. Avocado. Olive, canola, sesame, or safflower oils. Natural peanut or almond butter (choose ones without added sugar and oil). The items listed above may not be a complete list of recommended foods or beverages. Contact your dietitian for more options. What foods are not recommended? Grains White bread. White pasta. White rice. Cornbread. Bagels, pastries, and croissants. Crackers that contain trans fat. Vegetables White potatoes. Corn. Creamed or fried vegetables. Vegetables in a cheese sauce. Fruits Dried fruits. Canned fruit in light or heavy syrup. Fruit juice. Meat and Other Protein Products Fatty cuts of meat. Ribs, chicken wings, bacon, sausage, bologna, salami, chitterlings, fatback, hot dogs, bratwurst, and packaged luncheon meats. Liver and organ meats. Dairy Whole or 2% milk, cream, half-and-half, and cream cheese. Whole milk cheeses. Whole-fat or sweetened yogurt. Full-fat cheeses. Nondairy creamers and whipped toppings. Processed cheese, cheese spreads, or cheese curds. Sweets and Desserts Corn   syrup, sugars, honey, and molasses. Candy. Jam and jelly. Syrup. Sweetened cereals. Cookies, pies, cakes, donuts, muffins, and ice  cream. Fats and Oils Butter, stick margarine, lard, shortening, ghee, or bacon fat. Coconut, palm kernel, or palm oils. Beverages Alcohol. Sweetened drinks (such as sodas, lemonade, and fruit drinks or punches). The items listed above may not be a complete list of foods and beverages to avoid. Contact your dietitian for more information. This information is not intended to replace advice given to you by your health care provider. Make sure you discuss any questions you have with your health care provider. Document Released: 12/18/2011 Document Revised: 02/23/2016 Document Reviewed: 09/17/2013 Elsevier Interactive Patient Education  2018 [employer]vier Inc.  

## 2017-04-01 NOTE — Assessment & Plan Note (Signed)
Controlled on Metoprolol Monitor

## 2017-04-01 NOTE — Assessment & Plan Note (Signed)
Controlled on Zantac Monitor

## 2017-04-01 NOTE — Assessment & Plan Note (Signed)
Continue Tylenol for now He will see Dr. Sharlet Salina next week for injection

## 2017-04-01 NOTE — Assessment & Plan Note (Signed)
No angina, no statin Continue Eliquis

## 2017-04-01 NOTE — Assessment & Plan Note (Signed)
Continue Metoprolol and Eliquis 

## 2017-04-25 ENCOUNTER — Ambulatory Visit: Payer: Medicare Other

## 2017-05-06 IMAGING — CT CT ABD-PEL WO/W CM
1 of 4 series · 8 of 32 positions shown, 13 images · IV contrast (iopamidol)
Comparison: None.

CLINICAL DATA: Gross hematuria x3 weeks, history of prostate cancer
status post prostatectomy, prior appendectomy and left inguinal
hernia repair

EXAM:
CT ABDOMEN AND PELVIS WITHOUT AND WITH CONTRAST
TECHNIQUE: Multidetector CT imaging of the abdomen and pelvis was performed
following the standard protocol before and following the bolus
administration of intravenous contrast.
CONTRAST:  125mL [id number] IOPAMIDOL (RFY6KM-5VV) INJECTION 61%

[Series 12: axial delay · axial · delayed · 0.76mm/px · z∈[-910,-575]mm · 8 of 87 slices shown, 13 images]
[im 10/87  soft-tissue]
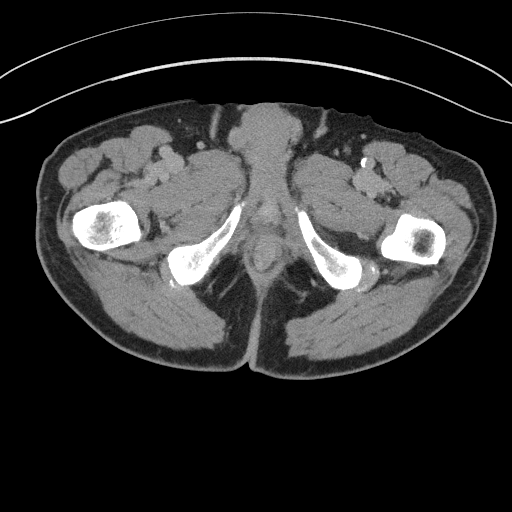
[im 10/87  bone]
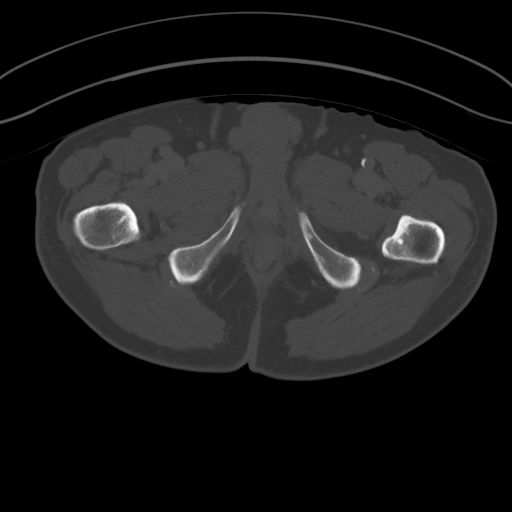
[im 20/87  soft-tissue]
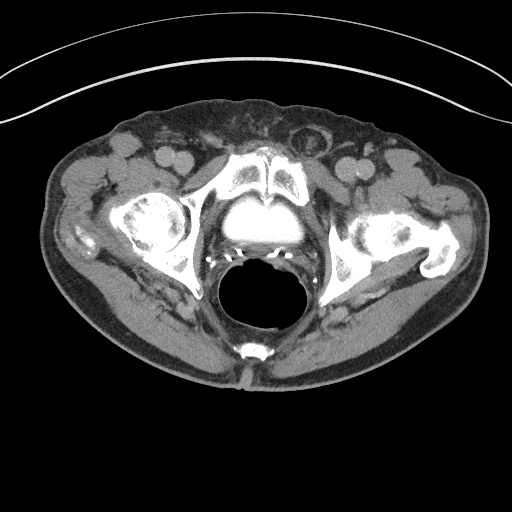
[im 29/87  soft-tissue]
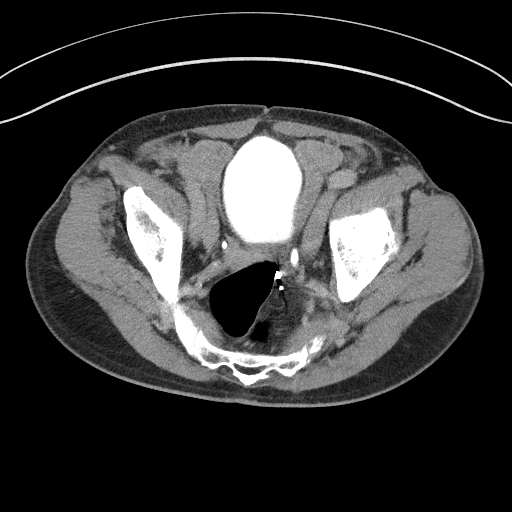
[im 39/87  soft-tissue]
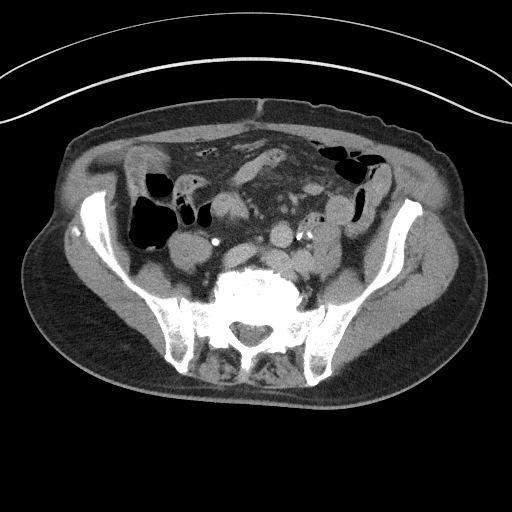
[im 48/87  soft-tissue]
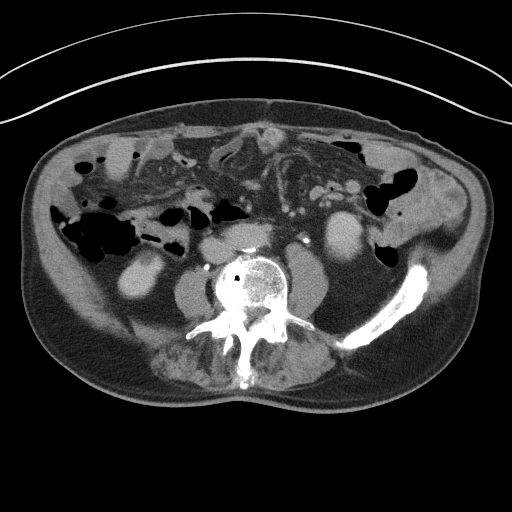
[im 48/87  lung]
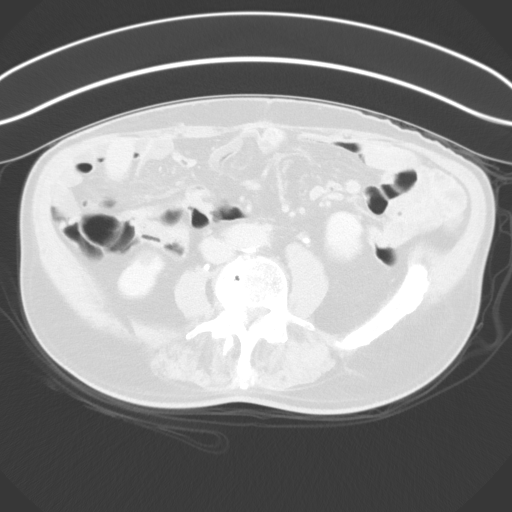
[im 58/87  soft-tissue]
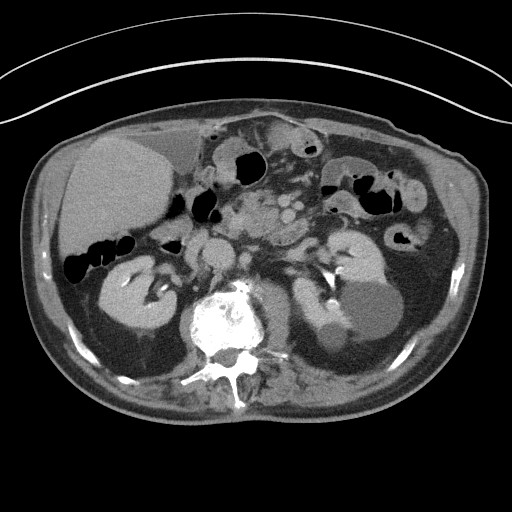
[im 58/87  lung]
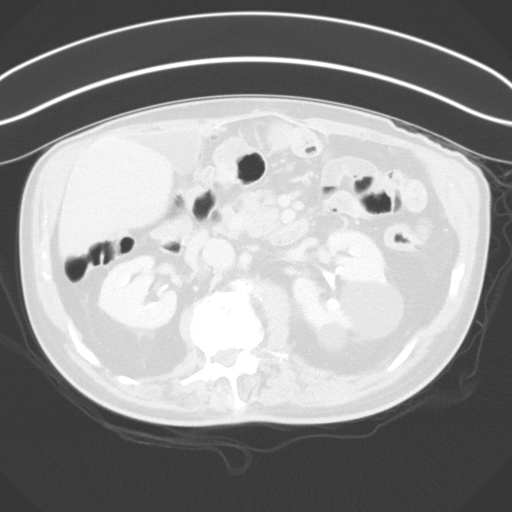
[im 67/87  soft-tissue]
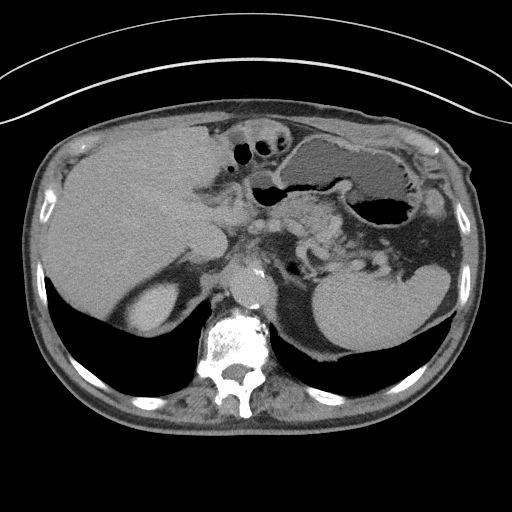
[im 67/87  lung]
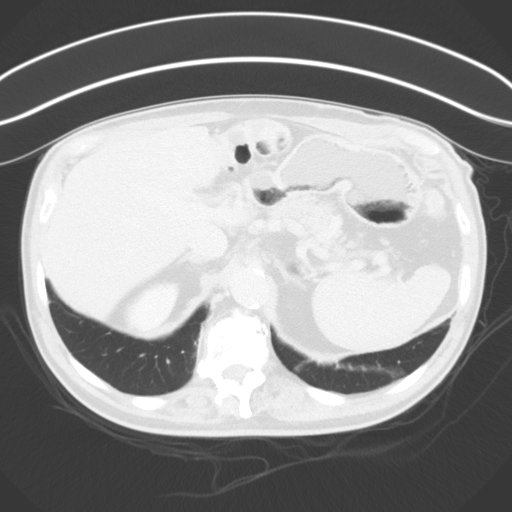
[im 77/87  soft-tissue]
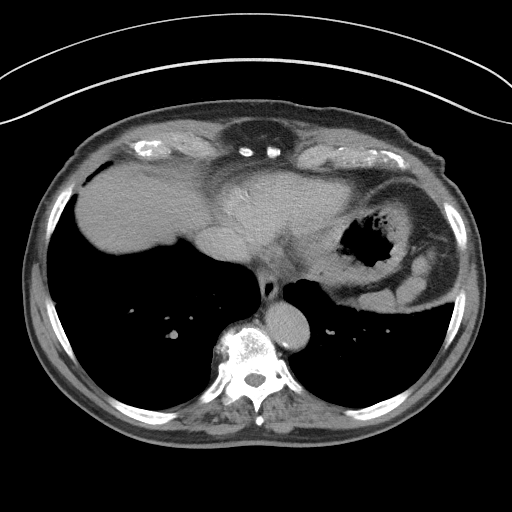
[im 77/87  lung]
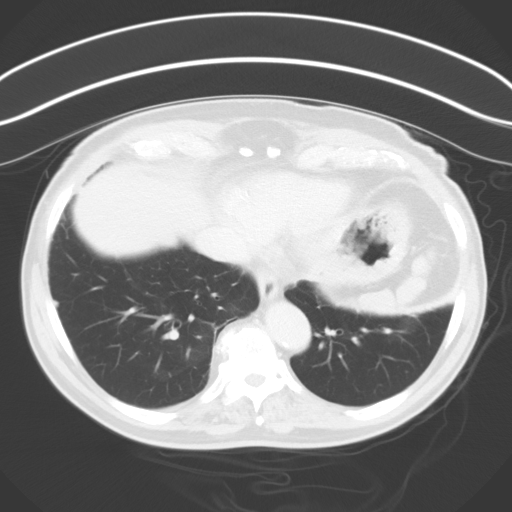

[8 of 32 positions shown; findings below may reference images not displayed]

FINDINGS: Lower chest: Linear scarring/ atelectasis in the left lower lobe.

Hepatobiliary: Liver is within normal limits.

Gallbladder is unremarkable. No intrahepatic or extrahepatic ductal
dilatation.

Pancreas: Within normal limits.

Spleen: Spleen is unremarkable.

Adrenals/Urinary Tract: Adrenal glands are within normal limits.

Bilateral renal cysts, measuring up to 5.2 cm in the posterior left
lower kidney (series 12/ image 32). A 4.1 cm medial left upper pole
renal cyst is mildly complex with thin septations and faint
calcification (series 4/ image 23), but no solid component or
enhancement, benign (Bosniak II).

No enhancing renal lesions.

Punctate nonobstructing left lower pole renal calculus (series 2/
image 32). No ureteral or bladder calculi.

Low-lying bladder.  Bladder is otherwise unremarkable.

On delayed imaging, there are no filling defects in the bilateral
opacified proximal collecting systems, ureters, or bladder.

Stomach/Bowel: Stomach is within normal limits.

No evidence of bowel obstruction.

Prior appendectomy.

Vascular/Lymphatic: Atherosclerotic calcifications of the abdominal
aorta and branch vessels.

No evidence of abdominal aortic aneurysm.

No suspicious abdominopelvic lymphadenopathy.

Prior pelvic lymphadenectomy.

Reproductive: Status post prostatectomy.

Other: No abdominopelvic ascites.

Small fat containing left inguinal hernia with trace fluid (series
4/image 67).

Musculoskeletal: Extensive degenerative changes of the visualized
thoracolumbar spine.
IMPRESSION: Bilateral renal cysts, including a 5.2 cm simple cyst in the left
lower kidney (Bosniak I) and a 4.1 cm mildly complex/ septated cyst
in the medial left upper kidney (Bosniak II).

Punctate nonobstructing left lower pole renal calculus. No ureteral
or bladder calculi. No hydronephrosis.

Low-lying bladder/cystocele at rest.

Status post prostatectomy. No evidence of recurrent or metastatic
disease.

## 2017-06-12 ENCOUNTER — Telehealth: Payer: Self-pay | Admitting: Cardiovascular Disease

## 2017-06-12 NOTE — Telephone Encounter (Signed)
° °  [facility] Pre-operative Risk Assessment    Request for surgical clearance:  1. What type of surgery is being performed? Cerrical facet joint injections   2. When is this surgery scheduled? 07/23/17   3. Are there any medications that need to be held prior to surgery and how long? Eliquis - 72 hours prior   4. Practice name and name of physician performing surgery? [facility], DO  5. What is your office phone and fax number? Phone [phone] and fax [phone]   6. Anesthesia type (None, local, MAC, general) ?    Ace Gins 06/12/2017, 12:57 PM  _________________________________________________________________   (provider comments below)

## 2017-06-12 NOTE — Telephone Encounter (Signed)
History of PAF- on eliquis. Last seen by Dr. Rockey Situ on 02/28/17.  Will forward to Dr. Rockey Situ to review.

## 2017-06-12 NOTE — Telephone Encounter (Signed)
acceptable risk to stop eliquis prior to procedure Typically we hold for 2 days They have requested 72 hours which should be ok Would restart after the procedure

## 2017-06-13 NOTE — Telephone Encounter (Signed)
Clearance routed to number listed. 

## 2017-06-20 ENCOUNTER — Ambulatory Visit: Payer: Medicare Other | Admitting: Internal Medicine

## 2017-06-20 ENCOUNTER — Encounter: Payer: Self-pay | Admitting: Internal Medicine

## 2017-06-20 VITALS — BP 124/70 | HR 76 | Temp 98.4°F | Resp 16 | Wt 154.0 lb

## 2017-06-20 DIAGNOSIS — K219 Gastro-esophageal reflux disease without esophagitis: Secondary | ICD-10-CM

## 2017-06-20 DIAGNOSIS — M5412 Radiculopathy, cervical region: Secondary | ICD-10-CM

## 2017-06-20 DIAGNOSIS — F0280 Dementia in other diseases classified elsewhere without behavioral disturbance: Secondary | ICD-10-CM | POA: Diagnosis not present

## 2017-06-20 DIAGNOSIS — G301 Alzheimer's disease with late onset: Secondary | ICD-10-CM | POA: Diagnosis not present

## 2017-06-20 DIAGNOSIS — I10 Essential (primary) hypertension: Secondary | ICD-10-CM

## 2017-06-20 DIAGNOSIS — I480 Paroxysmal atrial fibrillation: Secondary | ICD-10-CM

## 2017-06-20 NOTE — Assessment & Plan Note (Signed)
Persistent now On eliquis Will stop the asa

## 2017-06-20 NOTE — Assessment & Plan Note (Signed)
BP Readings from Last 3 Encounters:  06/20/17 124/70  04/01/17 105/74  02/28/17 118/60   Good control  No change needed

## 2017-06-20 NOTE — Assessment & Plan Note (Signed)
Mild Maintains functional independence but needs supervision On donepezil

## 2017-06-20 NOTE — Addendum Note (Signed)
Addended by: Viviana Simpler I on: 06/20/2017 11:37 AM   Modules accepted: Orders

## 2017-06-20 NOTE — Progress Notes (Signed)
Subjective:    Patient ID: Austin [name], male    DOB: 02-Mar-1931, 83 y.o.   MRN: [id number]  HPI Visit here for review of chronic health conditions Reviewed status with Luellen Pucker RN  Recent prednisone burst due to worsening of chronic neck pain---from Dr Sharlet Salina Will be getting repeat injection for radiculopathy next month He feels much better now  Ongoing mild confusion Thinks he saw me this morning and yesterday, etc  No chest pain No palpitations No SOB No dizziness or syncope No edema Tries to work out physically every day  No heartburn No dysphagia  Current Outpatient Medications on File Prior to Visit  Medication Sig Dispense Refill  . acetaminophen (TYLENOL) 500 MG tablet Take 1 tablet by mouth 3 (three) times daily.     Marland Kitchen apixaban (ELIQUIS) 5 MG TABS tablet Take 1 tablet (5 mg total) by mouth 2 (two) times daily. 60 tablet 6  . aspirin EC 81 MG tablet Take 1 tablet by mouth daily.    . calcium carbonate (TUMS EX) 750 MG chewable tablet Chew 2 tablets by mouth daily as needed.     . Cholecalciferol (VITAMIN D3) 2000 units capsule Take 1 capsule by mouth daily.     . Coenzyme Q10 (COQ10) 200 MG CAPS Take by mouth.    . diazepam (VALIUM) 5 MG tablet Take 0.5 tablets (2.5 mg total) by mouth 2 (two) times daily as needed for anxiety. (Patient taking differently: Take 2.5 mg by mouth at bedtime. ) 1 tablet 0  . Diclofenac Sodium 1 % CREA Place 2 g onto the skin 3 (three) times daily as needed. Neck and low back 120 g 11  . docusate sodium (COLACE) 100 MG capsule Take 100 mg by mouth daily.    Marland Kitchen donepezil (ARICEPT) 10 MG tablet Take 1 tablet by mouth at bedtime.    . Magnesium Oxide 400 (240 Mg) MG TABS Take 1 tablet by mouth 2 (two) times daily.    . meclizine (ANTIVERT) 25 MG tablet Take 1 tablet (25 mg total) by mouth 3 (three) times daily as needed for dizziness or nausea. 30 tablet 1  . metoprolol succinate (TOPROL-XL) 50 MG 24 hr tablet Take 1 tablet (50 mg total) by  mouth daily. Take with or immediately following a meal. 90 tablet 3  . Multiple Vitamin (MULTIVITAMIN WITH MINERALS) TABS tablet Take 1 tablet by mouth daily.    . ranitidine (ZANTAC) 150 MG tablet Take 1 tablet by mouth at bedtime.    . TURMERIC PO Take 1 tablet by mouth every evening.    . vitamin B-12 (CYANOCOBALAMIN) 1000 MCG tablet Take 1,000 mcg by mouth daily.     No current facility-administered medications on file prior to visit.     Allergies  Allergen Reactions  . Amiodarone Other (See Comments)    Trouble waking/falls  . Multaq [Dronedarone] Other (See Comments)    unknown  . Albuterol Palpitations    Past Medical History:  Diagnosis Date  . Alzheimer's dementia    ALZHEIMERS  . Atrial fibrillation [facility])    REVEAL DEVICE MEDTRONIC SERIAL #[id number] S MODEL #[id number]  . Coronary atherosclerosis of native coronary artery    MI 2014  . GERD (gastroesophageal reflux disease)    REFLUX  . Gout   . History of prostate cancer   . HTN (hypertension)   . LPRD (laryngopharyngeal reflux disease)   . Osteoarthritis, generalized    DJD neck and spine  . RBBB   . RBBB (  right bundle branch block)     Past Surgical History:  Procedure Laterality Date  . ABDOMINAL ADHESION SURGERY    . APPENDECTOMY    . DIRECT LARYNGOSCOPY Left 04/19/2016   Procedure: DIRECT MICROSCOPIC LARYNGOSCOPY WITH BIOPSY;  Surgeon: Margaretha Sheffield, MD;  Location: [place];  Service: ENT;  Laterality: Left;  . EP IMPLANTABLE DEVICE     Reveal device Medtronic Serial [id number]  Model [id number]  . INGUINAL HERNIA REPAIR Left   . KNEE SURGERY    . PROSTATECTOMY    . SHOULDER ARTHROSCOPY    . TREATMENT FISTULA ANAL      Family History  Problem Relation Age of Onset  . Hypertension Mother   . Chronic Renal Failure Mother   . Cancer Neg Hx   . Heart disease Neg Hx   . Diabetes Neg Hx   . Prostate cancer Neg Hx     Social History   Socioeconomic History  . Marital status: Widowed     Spouse name: Not on file  . Number of children: 3  . Years of education: Not on file  . Highest education level: Not on file  Social Needs  . Financial resource strain: Not on file  . Food insecurity - worry: Not on file  . Food insecurity - inability: Not on file  . Transportation needs - medical: Not on file  . Transportation needs - non-medical: Not on file  Occupational History  . Occupation: Professor --Therapist, nutritional    Comment: [employer]  Tobacco Use  . Smoking status: Never Smoker  . Smokeless tobacco: Never Used  Substance and Sexual Activity  . Alcohol use: No    Comment: 1 martini/ day  . Drug use: No  . Sexual activity: Not on file  Other Topics Concern  . Not on file  Social History Narrative   Has living will   All three children are health care POA   Would accept resuscitation attempts   No tube feeds if cognitively unaware   Review of Systems Eating well Weight up slightly (last one probably wrong) Sleeps well Stays involved and enjoys the  No other striking joint pains Mood has been fine--no problems off the med    Objective:   Physical Exam  Constitutional: He appears well-developed. No distress.  Neck: No thyromegaly present.  Fair active ROM now  Cardiovascular: Normal rate and normal heart sounds. Exam reveals no gallop.  No murmur heard. Irregular ~60  Musculoskeletal: He exhibits no edema or tenderness.  Lymphadenopathy:    He has no cervical adenopathy.  Neurological:  Repeats himself many times  Psychiatric: He has a normal mood and affect. His behavior is normal.          Assessment & Plan:

## 2017-06-20 NOTE — Assessment & Plan Note (Signed)
Better now after prednisone Rx Will continue with Dr Sharlet Salina Topical Rx also

## 2017-06-20 NOTE — Assessment & Plan Note (Signed)
No symptoms on med

## 2017-09-20 ENCOUNTER — Ambulatory Visit: Payer: Medicare Other | Admitting: Internal Medicine

## 2017-09-20 DIAGNOSIS — I2510 Atherosclerotic heart disease of native coronary artery without angina pectoris: Secondary | ICD-10-CM | POA: Diagnosis not present

## 2017-09-20 DIAGNOSIS — G301 Alzheimer's disease with late onset: Secondary | ICD-10-CM | POA: Diagnosis not present

## 2017-09-20 DIAGNOSIS — F0280 Dementia in other diseases classified elsewhere without behavioral disturbance: Secondary | ICD-10-CM | POA: Diagnosis not present

## 2017-09-20 DIAGNOSIS — K219 Gastro-esophageal reflux disease without esophagitis: Secondary | ICD-10-CM | POA: Diagnosis not present

## 2017-09-20 DIAGNOSIS — I480 Paroxysmal atrial fibrillation: Secondary | ICD-10-CM

## 2017-09-20 DIAGNOSIS — M159 Polyosteoarthritis, unspecified: Secondary | ICD-10-CM

## 2017-10-01 ENCOUNTER — Telehealth: Payer: Self-pay

## 2017-10-01 NOTE — Telephone Encounter (Signed)
Festus Aloe, MD  Lestine Box, LPN        Notify patient - overdue for renal US to follow a complex renal cyst (make sure no cancerous changes)    LMOM

## 2017-10-01 NOTE — Telephone Encounter (Signed)
Pt daughter, Austin [name] canceled pt appt, pt is at [facility] and gets medical care at the facility, daughter states father has other issues going on and doesn't think her father needs this appt at this time. Pt is doing fine urologically per dghtr  -- pt daughter states pt is being followed by Dr. Silvio Pate.

## 2017-10-02 ENCOUNTER — Encounter: Payer: Self-pay | Admitting: Internal Medicine

## 2017-10-02 MED ORDER — DONEPEZIL HCL 10 MG PO TABS: 10.0000 mg | ORAL_TABLET | Freq: Two times a day (BID) | ORAL | 2 refills | Status: DC

## 2017-10-02 NOTE — Progress Notes (Signed)
Subjective:    Patient ID: Austin [name], male    DOB: Dec 25, 1930, 84 y.o.   MRN: [id number]  HPI  Routine visit for resident in [address] Reviewed with RN, no new concerns. Resident reports chronic neck pain, managed with Tylenol. Neck pain sometimes interrupts sleep. Independent with ADL's. Walks with device. Appetite good. Weight stable. Denies issues with bowel or bladder.  Alzheimer's: Mild congnitive needs, stable functional status. On Aricept. Needs ALF assist.  Afib: Controlled with Metoprolol and Eliquis.  HLD with CAD: He denies angina. He is taking Metoprolol and Eliquis as prescribed.  GERD: Controlled on Ranitidine.   Gout: No recent meds. Not on prophylactic therapy.  OA: Mainly in neck and back. This is really his biggest complaints. He takes Tylenol as prescribed. He sometimes takes Valium at night to help him sleep when his neck is really bothering him.  Review of Systems      Past Medical History:  Diagnosis Date  . Alzheimer's dementia    ALZHEIMERS  . Atrial fibrillation [facility])    REVEAL DEVICE MEDTRONIC SERIAL #[id number] S MODEL #[id number]  . Coronary atherosclerosis of native coronary artery    MI 2014  . GERD (gastroesophageal reflux disease)    REFLUX  . Gout   . History of prostate cancer   . HTN (hypertension)   . LPRD (laryngopharyngeal reflux disease)   . Osteoarthritis, generalized    DJD neck and spine  . RBBB   . RBBB (right bundle branch block)     Current Outpatient Medications  Medication Sig Dispense Refill  . acetaminophen (TYLENOL) 500 MG tablet Take 1 tablet by mouth 3 (three) times daily.     Marland Kitchen apixaban (ELIQUIS) 5 MG TABS tablet Take 1 tablet (5 mg total) by mouth 2 (two) times daily. 60 tablet 6  . calcium carbonate (TUMS EX) 750 MG chewable tablet Chew 2 tablets by mouth daily as needed.     . Cholecalciferol (VITAMIN D3) 2000 units capsule Take 1 capsule by mouth daily.     . Coenzyme Q10 (COQ10) 200 MG CAPS Take by mouth.    .  diazepam (VALIUM) 5 MG tablet Take 0.5 tablets (2.5 mg total) by mouth 2 (two) times daily as needed for anxiety. (Patient taking differently: Take 2.5 mg by mouth at bedtime. ) 1 tablet 0  . Diclofenac Sodium 1 % CREA Place 2 g onto the skin 3 (three) times daily as needed. Neck and low back 120 g 11  . docusate sodium (COLACE) 100 MG capsule Take 100 mg by mouth daily.    Marland Kitchen donepezil (ARICEPT) 10 MG tablet Take 1 tablet by mouth at bedtime.    . Magnesium Oxide 400 (240 Mg) MG TABS Take 1 tablet by mouth 2 (two) times daily.    . meclizine (ANTIVERT) 25 MG tablet Take 1 tablet (25 mg total) by mouth 3 (three) times daily as needed for dizziness or nausea. 30 tablet 1  . metoprolol succinate (TOPROL-XL) 50 MG 24 hr tablet Take 1 tablet (50 mg total) by mouth daily. Take with or immediately following a meal. 90 tablet 3  . Multiple Vitamin (MULTIVITAMIN WITH MINERALS) TABS tablet Take 1 tablet by mouth daily.    . ranitidine (ZANTAC) 150 MG tablet Take 1 tablet by mouth at bedtime.    . TURMERIC PO Take 1 tablet by mouth every evening.    . vitamin B-12 (CYANOCOBALAMIN) 1000 MCG tablet Take 1,000 mcg by mouth daily.     No  current facility-administered medications for this visit.     Allergies  Allergen Reactions  . Amiodarone Other (See Comments)    Trouble waking/falls  . Multaq [Dronedarone] Other (See Comments)    unknown  . Albuterol Palpitations    Family History  Problem Relation Age of Onset  . Hypertension Mother   . Chronic Renal Failure Mother   . Cancer Neg Hx   . Heart disease Neg Hx   . Diabetes Neg Hx   . Prostate cancer Neg Hx     Social History   Socioeconomic History  . Marital status: Widowed    Spouse name: Not on file  . Number of children: 3  . Years of education: Not on file  . Highest education level: Not on file  Occupational History  . Occupation: Professor --Therapist, nutritional    Comment: [place]  . Financial resource strain:  Not on file  . Food insecurity:    Worry: Not on file    Inability: Not on file  . Transportation needs:    Medical: Not on file    Non-medical: Not on file  Tobacco Use  . Smoking status: Never Smoker  . Smokeless tobacco: Never Used  Substance and Sexual Activity  . Alcohol use: No    Comment: 1 martini/ day  . Drug use: No  . Sexual activity: Not on file  Lifestyle  . Physical activity:    Days per week: Not on file    Minutes per session: Not on file  . Stress: Not on file  Relationships  . Social connections:    Talks on phone: Not on file    Gets together: Not on file    Attends religious service: Not on file    Active member of club or organization: Not on file    Attends meetings of clubs or organizations: Not on file    Relationship status: Not on file  . Intimate partner violence:    Fear of current or ex partner: Not on file    Emotionally abused: Not on file    Physically abused: Not on file    Forced sexual activity: Not on file  Other Topics Concern  . Not on file  Social History Narrative   Has living will   All three children are health care POA   Would accept resuscitation attempts   No tube feeds if cognitively unaware     Constitutional: Denies fever, malaise, fatigue, headache or abrupt weight changes.  HEENT: Denies eye pain, eye redness, ear pain, ringing in the ears, wax buildup, runny nose, nasal congestion, bloody nose, or sore throat. Respiratory: Denies difficulty breathing, shortness of breath, cough or sputum production.   Cardiovascular: Denies chest pain, chest tightness, palpitations or swelling in the hands or feet.  Gastrointestinal: Denies abdominal pain, bloating, constipation, diarrhea or blood in the stool.  GU: Denies urgency, frequency, pain with urination, burning sensation, blood in urine, odor or discharge. Musculoskeletal: Pt reports neck pain. Denies decrease in range of motion, difficulty with gait, muscle pain or joint  swelling.  Skin: Denies redness, rashes, lesions or ulcercations.  Neurological: Pt reports difficulty with memory. Denies dizziness, difficulty with speech or problems with balance and coordination.  Psych: Denies anxiety, depression, SI/HI.  No other specific complaints in a complete review of systems (except as listed in HPI above).  Objective:   Physical Exam  BP 130/60   Pulse 68   Temp 98.1 F (36.7 C)  Wt 158 lb 9.6 oz (71.9 kg)   BMI 28.09 kg/m  Wt Readings from Last 3 Encounters:  10/02/17 158 lb 9.6 oz (71.9 kg)  06/20/17 154 lb (69.9 kg)  04/01/17 135 lb (61.2 kg)    General: Appears his stated age, well developed, well nourished in NAD. Cardiovascular: Normal rate with irregular rhythm. S1,S2 noted.  No murmur, rubs or gallops noted.  Pulmonary/Chest: Normal effort and positive vesicular breath sounds. No respiratory distress. No wheezes, rales or ronchi noted.  Musculoskeletal: Decreased flexion and extension of the cervical spine. Normal rotation. Pain with palpation over the cervical spine.   Neurological: Alert and oriented. Decreased short term memory and often repeats himself. Psychiatric: Mood and affect normal. Behavior is normal. Judgment and thought content normal.     BMET    Component Value Date/Time   NA 136 08/07/2016 0926   NA 144 05/15/2016 0857   K 3.6 08/07/2016 0926   CL 103 08/07/2016 0926   CO2 27 08/07/2016 0926   GLUCOSE 108 (H) 08/07/2016 0926   BUN 22 (H) 08/07/2016 0926   BUN 20 05/15/2016 0857   CREATININE 0.68 08/07/2016 0926   CALCIUM 9.3 08/07/2016 0926   GFRNONAA >60 08/07/2016 0926   GFRAA >60 08/07/2016 0926    Lipid Panel     Component Value Date/Time   CHOL 178 05/15/2016 0857   TRIG 106 05/15/2016 0857   HDL 53 05/15/2016 0857   CHOLHDL 3.4 05/15/2016 0857   LDLCALC 104 (H) 05/15/2016 0857    CBC    Component Value Date/Time   WBC 5.8 08/07/2016 0926   RBC 4.34 (L) 08/07/2016 0926   HGB 13.2 08/07/2016  0926   HCT 38.1 (L) 08/07/2016 0926   PLT 132 (L) 08/07/2016 0926   MCV 87.8 08/07/2016 0926   MCH 30.5 08/07/2016 0926   MCHC 34.7 08/07/2016 0926   RDW 15.0 (H) 08/07/2016 0926   LYMPHSABS 1.3 08/07/2016 0926   MONOABS 0.7 08/07/2016 0926   EOSABS 0.1 08/07/2016 0926   BASOSABS 0.0 08/07/2016 0926    Hgb A1C No results found for: HGBA1C          Assessment & Plan:

## 2017-10-02 NOTE — Assessment & Plan Note (Signed)
Continue Metoprolol and Eliquis 

## 2017-10-02 NOTE — Assessment & Plan Note (Signed)
Continue Ranitidine. 

## 2017-10-02 NOTE — Assessment & Plan Note (Addendum)
Continue Aricept 

## 2017-10-02 NOTE — Assessment & Plan Note (Signed)
Controlled with Tylenol and Valium Monitor

## 2017-10-02 NOTE — Patient Instructions (Signed)
Neck Exercises Neck exercises can be important for many reasons:  They can help you to improve and maintain flexibility in your neck. This can be especially important as you age.  They can help to make your neck stronger. This can make movement easier.  They can reduce or prevent neck pain.  They may help your upper back.  Ask your health care provider which neck exercises would be best for you. Exercises Neck Press Repeat this exercise 10 times. Do it first thing in the morning and right before bed or as told by your health care provider. 1. Lie on your back on a firm bed or on the floor with a pillow under your head. 2. Use your neck muscles to push your head down on the pillow and straighten your spine. 3. Hold the position as well as you can. Keep your head facing up and your chin tucked. 4. Slowly count to 5 while holding this position. 5. Relax for a few seconds. Then repeat.  Isometric Strengthening Do a full set of these exercises 2 times a day or as told by your health care provider. 1. Sit in a supportive chair and place your hand on your forehead. 2. Push forward with your head and neck while pushing back with your hand. Hold for 10 seconds. 3. Relax. Then repeat the exercise 3 times. 4. Next, do thesequence again, this time putting your hand against the back of your head. Use your head and neck to push backward against the hand pressure. 5. Finally, do the same exercise on either side of your head, pushing sideways against the pressure of your hand.  Prone Head Lifts Repeat this exercise 5 times. Do this 2 times a day or as told by your health care provider. 1. Lie face-down, resting on your elbows so that your chest and upper back are raised. 2. Start with your head facing downward, near your chest. Position your chin either on or near your chest. 3. Slowly lift your head upward. Lift until you are looking straight ahead. Then continue lifting your head as far back as  you can stretch. 4. Hold your head up for 5 seconds. Then slowly lower it to your starting position.  Supine Head Lifts Repeat this exercise 8-10 times. Do this 2 times a day or as told by your health care provider. 1. Lie on your back, bending your knees to point to the ceiling and keeping your feet flat on the floor. 2. Lift your head slowly off the floor, raising your chin toward your chest. 3. Hold for 5 seconds. 4. Relax and repeat.  Scapular Retraction Repeat this exercise 5 times. Do this 2 times a day or as told by your health care provider. 1. Stand with your arms at your sides. Look straight ahead. 2. Slowly pull both shoulders backward and downward until you feel a stretch between your shoulder blades in your upper back. 3. Hold for 10-30 seconds. 4. Relax and repeat.  Contact a health care provider if:  Your neck pain or discomfort gets much worse when you do an exercise.  Your neck pain or discomfort does not improve within 2 hours after you exercise. If you have any of these problems, stop exercising right away. Do not do the exercises again unless your health care provider says that you can. Get help right away if:  You develop sudden, severe neck pain. If this happens, stop exercising right away. Do not do the exercises again unless your   health care provider says that you can. Exercises Neck Stretch  Repeat this exercise 3-5 times. 1. Do this exercise while standing or while sitting in a chair. 2. Place your feet flat on the floor, shoulder-width apart. 3. Slowly turn your head to the right. Turn it all the way to the right so you can look over your right shoulder. Do not tilt or tip your head. 4. Hold this position for 10-30 seconds. 5. Slowly turn your head to the left, to look over your left shoulder. 6. Hold this position for 10-30 seconds.  Neck Retraction Repeat this exercise 8-10 times. Do this 3-4 times a day or as told by your health care  provider. 1. Do this exercise while standing or while sitting in a sturdy chair. 2. Look straight ahead. Do not bend your neck. 3. Use your fingers to push your chin backward. Do not bend your neck for this movement. Continue to face straight ahead. If you are doing the exercise properly, you will feel a slight sensation in your throat and a stretch at the back of your neck. 4. Hold the stretch for 1-2 seconds. Relax and repeat.  This information is not intended to replace advice given to you by your health care provider. Make sure you discuss any questions you have with your health care provider. Document Released: 05/30/2015 Document Revised: 11/24/2015 Document Reviewed: 12/27/2014 Elsevier Interactive Patient Education  2018 [employer]  

## 2017-12-19 ENCOUNTER — Encounter: Payer: Self-pay | Admitting: Internal Medicine

## 2017-12-19 ENCOUNTER — Ambulatory Visit: Payer: Medicare Other | Admitting: Internal Medicine

## 2017-12-19 DIAGNOSIS — F0280 Dementia in other diseases classified elsewhere without behavioral disturbance: Secondary | ICD-10-CM

## 2017-12-19 DIAGNOSIS — M5412 Radiculopathy, cervical region: Secondary | ICD-10-CM | POA: Diagnosis not present

## 2017-12-19 DIAGNOSIS — I2510 Atherosclerotic heart disease of native coronary artery without angina pectoris: Secondary | ICD-10-CM

## 2017-12-19 DIAGNOSIS — K219 Gastro-esophageal reflux disease without esophagitis: Secondary | ICD-10-CM

## 2017-12-19 DIAGNOSIS — G301 Alzheimer's disease with late onset: Secondary | ICD-10-CM | POA: Diagnosis not present

## 2017-12-19 DIAGNOSIS — I480 Paroxysmal atrial fibrillation: Secondary | ICD-10-CM | POA: Diagnosis not present

## 2017-12-19 NOTE — Progress Notes (Signed)
Subjective:    Patient ID: Austin [name], male    DOB: [birth year], 84 y.o.   MRN: [id number]  HPI Visit in [facility] room for follow up of chronic medical conditions Reviewed status with facility RN  Very satisfied here Likes the food Stays active---[place] times daily  Still sees Dr Sharlet Salina for cervical radiculopathy Expecting ESI again soon He doesn't let it limit him  Ongoing memory problems Forgets appts (like mine), Freight forwarder functional independence  No chest pain No palpitations No SOB No change in exercise tolerance No dizziness  Current Outpatient Medications on File Prior to Visit  Medication Sig Dispense Refill  . acetaminophen (TYLENOL) 500 MG tablet Take 1 tablet by mouth 3 (three) times daily.     Marland Kitchen apixaban (ELIQUIS) 5 MG TABS tablet Take 1 tablet (5 mg total) by mouth 2 (two) times daily. 60 tablet 6  . calcium carbonate (TUMS EX) 750 MG chewable tablet Chew 2 tablets by mouth daily as needed.     . Cholecalciferol (VITAMIN D3) 2000 units capsule Take 1 capsule by mouth daily.     . Coenzyme Q10 (COQ10) 200 MG CAPS Take by mouth.    . diazepam (VALIUM) 5 MG tablet Take 2.5 mg by mouth at bedtime.    . Diclofenac Sodium 1 % CREA Place 2 g onto the skin 3 (three) times daily as needed. Neck and low back 120 g 11  . docusate sodium (COLACE) 100 MG capsule Take 100 mg by mouth daily.    Marland Kitchen donepezil (ARICEPT) 10 MG tablet Take 1 tablet (10 mg total) by mouth 2 (two) times daily. 60 tablet 2  . Magnesium Oxide 400 (240 Mg) MG TABS Take 1 tablet by mouth 2 (two) times daily.    . meclizine (ANTIVERT) 25 MG tablet Take 1 tablet (25 mg total) by mouth 3 (three) times daily as needed for dizziness or nausea. 30 tablet 1  . metoprolol succinate (TOPROL-XL) 50 MG 24 hr tablet Take 1 tablet (50 mg total) by mouth daily. Take with or immediately following a meal. 90 tablet 3  . Multiple Vitamin (MULTIVITAMIN WITH MINERALS) TABS tablet Take 1 tablet by  mouth daily.    . ranitidine (ZANTAC) 150 MG tablet Take 1 tablet by mouth at bedtime.    . TURMERIC PO Take 1 tablet by mouth every evening.    . vitamin B-12 (CYANOCOBALAMIN) 1000 MCG tablet Take 1,000 mcg by mouth daily.     No current facility-administered medications on file prior to visit.     Allergies  Allergen Reactions  . Amiodarone Other (See Comments)    Trouble waking/falls  . Multaq [Dronedarone] Other (See Comments)    unknown  . Albuterol Palpitations    Past Medical History:  Diagnosis Date  . Alzheimer's dementia    ALZHEIMERS  . Atrial fibrillation [facility])    REVEAL DEVICE MEDTRONIC SERIAL #[id number] S MODEL #[id number]  . Coronary atherosclerosis of native coronary artery    MI 2014  . GERD (gastroesophageal reflux disease)    REFLUX  . Gout   . History of prostate cancer   . HTN (hypertension)   . LPRD (laryngopharyngeal reflux disease)   . Osteoarthritis, generalized    DJD neck and spine  . RBBB   . RBBB (right bundle branch block)     Past Surgical History:  Procedure Laterality Date  . ABDOMINAL ADHESION SURGERY    . APPENDECTOMY    . DIRECT LARYNGOSCOPY Left 04/19/2016  Procedure: DIRECT MICROSCOPIC LARYNGOSCOPY WITH BIOPSY;  Surgeon: Margaretha Sheffield, MD;  Location: [place];  Service: ENT;  Laterality: Left;  . EP IMPLANTABLE DEVICE     Reveal device Medtronic Serial [id number]  Model [id number]  . INGUINAL HERNIA REPAIR Left   . KNEE SURGERY    . PROSTATECTOMY    . SHOULDER ARTHROSCOPY    . TREATMENT FISTULA ANAL      Family History  Problem Relation Age of Onset  . Hypertension Mother   . Chronic Renal Failure Mother   . Cancer Neg Hx   . Heart disease Neg Hx   . Diabetes Neg Hx   . Prostate cancer Neg Hx     Social History   Socioeconomic History  . Marital status: Widowed    Spouse name: Not on file  . Number of children: 3  . Years of education: Not on file  . Highest education level: Not on file  Occupational History   . Occupation: Professor --Therapist, nutritional    Comment: [place]  . Financial resource strain: Not on file  . Food insecurity:    Worry: Not on file    Inability: Not on file  . Transportation needs:    Medical: Not on file    Non-medical: Not on file  Tobacco Use  . Smoking status: Never Smoker  . Smokeless tobacco: Never Used  Substance and Sexual Activity  . Alcohol use: No    Comment: 1 martini/ day  . Drug use: No  . Sexual activity: Not on file  Lifestyle  . Physical activity:    Days per week: Not on file    Minutes per session: Not on file  . Stress: Not on file  Relationships  . Social connections:    Talks on phone: Not on file    Gets together: Not on file    Attends religious service: Not on file    Active member of club or organization: Not on file    Attends meetings of clubs or organizations: Not on file    Relationship status: Not on file  . Intimate partner violence:    Fear of current or ex partner: Not on file    Emotionally abused: Not on file    Physically abused: Not on file    Forced sexual activity: Not on file  Other Topics Concern  . Not on file  Social History Narrative   Has living will   All three children are health care POA   Would accept resuscitation attempts   No tube feeds if cognitively unaware   Review of Systems Weight is  Sleeps well---bed or recliner Recent brief bout with vertigo--- meclizine helped Generally voids okay--nocturia at most once Bowels are fine    Objective:   Physical Exam  Constitutional: He appears well-developed. No distress.  Neck: No thyromegaly present.  Cardiovascular: Exam reveals no gallop.  No murmur heard. Slow and irregular  Respiratory: Effort normal and breath sounds normal. No respiratory distress. He has no wheezes. He has no rales.  Musculoskeletal: He exhibits no edema.  Lymphadenopathy:    He has no cervical adenopathy.  Neurological:  Forgot appt. Repeats  himself but normal interaction otherwise  Psychiatric: He has a normal mood and affect. His behavior is normal.           Assessment & Plan:

## 2017-12-19 NOTE — Assessment & Plan Note (Signed)
Ongoing (mostly) topical Rx plus expecting another ESI from Dr Sharlet Salina

## 2017-12-19 NOTE — Assessment & Plan Note (Signed)
No symptoms in some time

## 2017-12-19 NOTE — Assessment & Plan Note (Signed)
Seems to be quiet on ranitidine

## 2017-12-19 NOTE — Assessment & Plan Note (Signed)
Seems persistent now On eliquis

## 2017-12-19 NOTE — Assessment & Plan Note (Signed)
Mild without progression Doing well with the support in assisted living unit Continue aricept

## 2018-03-14 ENCOUNTER — Encounter: Payer: Self-pay | Admitting: Internal Medicine

## 2018-03-14 ENCOUNTER — Ambulatory Visit: Payer: Medicare Other | Admitting: Internal Medicine

## 2018-03-14 DIAGNOSIS — K219 Gastro-esophageal reflux disease without esophagitis: Secondary | ICD-10-CM

## 2018-03-14 DIAGNOSIS — I2510 Atherosclerotic heart disease of native coronary artery without angina pectoris: Secondary | ICD-10-CM

## 2018-03-14 DIAGNOSIS — G301 Alzheimer's disease with late onset: Secondary | ICD-10-CM

## 2018-03-14 DIAGNOSIS — M109 Gout, unspecified: Secondary | ICD-10-CM | POA: Insufficient documentation

## 2018-03-14 DIAGNOSIS — M159 Polyosteoarthritis, unspecified: Secondary | ICD-10-CM

## 2018-03-14 DIAGNOSIS — M1A9XX0 Chronic gout, unspecified, without tophus (tophi): Secondary | ICD-10-CM

## 2018-03-14 DIAGNOSIS — F0280 Dementia in other diseases classified elsewhere without behavioral disturbance: Secondary | ICD-10-CM

## 2018-03-14 DIAGNOSIS — I480 Paroxysmal atrial fibrillation: Secondary | ICD-10-CM | POA: Diagnosis not present

## 2018-03-14 DIAGNOSIS — F419 Anxiety disorder, unspecified: Secondary | ICD-10-CM

## 2018-03-14 DIAGNOSIS — Z8546 Personal history of malignant neoplasm of prostate: Secondary | ICD-10-CM

## 2018-03-14 NOTE — Assessment & Plan Note (Signed)
No issues off meds °Will monitor °

## 2018-03-14 NOTE — Assessment & Plan Note (Signed)
Continue low dose Valium in the evening, wean not indicated

## 2018-03-14 NOTE — Assessment & Plan Note (Signed)
Continue scheduled Tylenol

## 2018-03-14 NOTE — Assessment & Plan Note (Signed)
Mild cognitive, stable functional needs Continue Aricept Appreciate ALF care

## 2018-03-14 NOTE — Assessment & Plan Note (Signed)
Stable on Ranitidine Will monitor

## 2018-03-14 NOTE — Assessment & Plan Note (Signed)
No angina Continue Metoprolol and Eliquis

## 2018-03-14 NOTE — Assessment & Plan Note (Signed)
S/p prostatectomy

## 2018-03-14 NOTE — Progress Notes (Signed)
Subjective:    Patient ID: Austin [name], male    DOB: [birth year], 84 y.o.   MRN: [id number]  HPI  Asked to see resident in [address] for routine follow up Reviewed with RN. She reports residents gets agitated nightly because he is unable to work his TV. Often throws the remote, yells at aid. Valium time was changed to earlier in the evening to see if this would help. Otherwise, no new concerns. Resident reports he sleeps well. Independent with ADL's. Walks without device. Very engaged in activities. Appetite good. Weight stable. Bowels okay. He denies urinary issues. He does c/o chronic neck pain, relieved with Tylenol. He denies chest pain or shortness of breath.  GERD: He denies breakthrough on Ranitidine. There is no upper GI on file.  Afib: Chronic but stable on Metoprolol and Eliquis. ECG from 01/2017 reviewed.  Alzheimers: Mild cognitive, stable functional needs. On Aricept.  Anxiety: Worse at night. On low dose Valium.  OA: Mainly in his neck. Pain controlled with scheduled Tylenol.  Gout: No recent flares off meds  Hx of Prostate Cancer: s/p prostatectomy.  CAD: No angina. On Metoprolol and Eliquis.  Review of Systems      Past Medical History:  Diagnosis Date  . Alzheimer's dementia    ALZHEIMERS  . Atrial fibrillation [facility])    REVEAL DEVICE MEDTRONIC SERIAL #[id number] S MODEL #[id number]  . Coronary atherosclerosis of native coronary artery    MI 2014  . GERD (gastroesophageal reflux disease)    REFLUX  . Gout   . History of prostate cancer   . HTN (hypertension)   . LPRD (laryngopharyngeal reflux disease)   . Osteoarthritis, generalized    DJD neck and spine  . RBBB   . RBBB (right bundle branch block)     Current Outpatient Medications  Medication Sig Dispense Refill  . acetaminophen (TYLENOL) 500 MG tablet Take 1 tablet by mouth 3 (three) times daily.     Marland Kitchen apixaban (ELIQUIS) 5 MG TABS tablet Take 1 tablet (5 mg total) by mouth 2 (two) times daily. 60  tablet 6  . calcium carbonate (TUMS EX) 750 MG chewable tablet Chew 2 tablets by mouth daily as needed.     . Cholecalciferol (VITAMIN D3) 2000 units capsule Take 1 capsule by mouth daily.     . Coenzyme Q10 (COQ10) 200 MG CAPS Take by mouth.    . diazepam (VALIUM) 5 MG tablet Take 2.5 mg by mouth at bedtime.    . donepezil (ARICEPT) 10 MG tablet Take 1 tablet (10 mg total) by mouth 2 (two) times daily. 60 tablet 2  . Magnesium Oxide 400 (240 Mg) MG TABS Take 1 tablet by mouth 2 (two) times daily.    . metoprolol succinate (TOPROL-XL) 50 MG 24 hr tablet Take 1 tablet (50 mg total) by mouth daily. Take with or immediately following a meal. 90 tablet 3  . Multiple Vitamin (MULTIVITAMIN WITH MINERALS) TABS tablet Take 1 tablet by mouth daily.    . ranitidine (ZANTAC) 150 MG tablet Take 1 tablet by mouth at bedtime.    . TURMERIC PO Take 1 tablet by mouth every evening.    . vitamin B-12 (CYANOCOBALAMIN) 1000 MCG tablet Take 1,000 mcg by mouth daily.    . Diclofenac Sodium 1 % CREA Place 2 g onto the skin 3 (three) times daily as needed. Neck and low back (Patient not taking: Reported on 03/14/2018) 120 g 11  . docusate sodium (COLACE) 100 MG capsule Take  100 mg by mouth daily.    . meclizine (ANTIVERT) 25 MG tablet Take 1 tablet (25 mg total) by mouth 3 (three) times daily as needed for dizziness or nausea. (Patient not taking: Reported on 03/14/2018) 30 tablet 1   No current facility-administered medications for this visit.     Allergies  Allergen Reactions  . Amiodarone Other (See Comments)    Trouble waking/falls  . Multaq [Dronedarone] Other (See Comments)    unknown  . Albuterol Palpitations    Family History  Problem Relation Age of Onset  . Hypertension Mother   . Chronic Renal Failure Mother   . Cancer Neg Hx   . Heart disease Neg Hx   . Diabetes Neg Hx   . Prostate cancer Neg Hx     Social History   Socioeconomic History  . Marital status: Widowed    Spouse name: Not on  file  . Number of children: 3  . Years of education: Not on file  . Highest education level: Not on file  Occupational History  . Occupation: Professor --Therapist, nutritional    Comment: [place]  . Financial resource strain: Not on file  . Food insecurity:    Worry: Not on file    Inability: Not on file  . Transportation needs:    Medical: Not on file    Non-medical: Not on file  Tobacco Use  . Smoking status: Never Smoker  . Smokeless tobacco: Never Used  Substance and Sexual Activity  . Alcohol use: No    Comment: 1 martini/ day  . Drug use: No  . Sexual activity: Not on file  Lifestyle  . Physical activity:    Days per week: Not on file    Minutes per session: Not on file  . Stress: Not on file  Relationships  . Social connections:    Talks on phone: Not on file    Gets together: Not on file    Attends religious service: Not on file    Active member of club or organization: Not on file    Attends meetings of clubs or organizations: Not on file    Relationship status: Not on file  . Intimate partner violence:    Fear of current or ex partner: Not on file    Emotionally abused: Not on file    Physically abused: Not on file    Forced sexual activity: Not on file  Other Topics Concern  . Not on file  Social History Narrative   Has living will   All three children are health care POA   Would accept resuscitation attempts   No tube feeds if cognitively unaware     Constitutional: Denies fever, malaise, fatigue, headache or abrupt weight changes.  HEENT: Denies eye pain, eye redness, ear pain, ringing in the ears, wax buildup, runny nose, nasal congestion, bloody nose, or sore throat. Respiratory: Denies difficulty breathing, shortness of breath, cough or sputum production.   Cardiovascular: Denies chest pain, chest tightness, palpitations or swelling in the hands or feet.  Gastrointestinal: Denies abdominal pain, bloating, constipation, diarrhea or  blood in the stool.  GU: Denies urgency, frequency, pain with urination, burning sensation, blood in urine, odor or discharge. Musculoskeletal: Pt reports neck pain. Denies decrease in range of motion, difficulty with gait, muscle pain or joint swelling.  Skin: Denies redness, rashes, lesions or ulcercations.  Neurological: Denies dizziness, difficulty with memory, difficulty with speech or problems with balance and coordination.  Psych:  Pt has history of anxiety. Denies depression, SI/HI.  No other specific complaints in a complete review of systems (except as listed in HPI above).  Objective:   Physical Exam   BP 121/61   Pulse 74   Temp (!) 97.2 F (36.2 C)   Resp 16   Wt 157 lb 12.8 oz (71.6 kg)   BMI 27.95 kg/m  Wt Readings from Last 3 Encounters:  03/14/18 157 lb 12.8 oz (71.6 kg)  10/02/17 158 lb 9.6 oz (71.9 kg)  06/20/17 154 lb (69.9 kg)    General: Appears his stated age, in NAD. Neck:  Neck supple, trachea midline. No masses, lumps or thyromegaly present.  Cardiovascular: Normal rate with irregular rhythm. No murmur, rubs or gallops noted. No JVD or BLE edema.  Pulmonary/Chest: Normal effort and positive vesicular breath sounds. No respiratory distress. No wheezes, rales or ronchi noted.  Abdomen: Soft and nontender. Normal bowel sounds. No distention or masses noted.  Musculoskeletal: Gait steady without device. Neurological: Alert and oriented. Repeats himself frequently.  BMET    Component Value Date/Time   NA 136 08/07/2016 0926   NA 144 05/15/2016 0857   K 3.6 08/07/2016 0926   CL 103 08/07/2016 0926   CO2 27 08/07/2016 0926   GLUCOSE 108 (H) 08/07/2016 0926   BUN 22 (H) 08/07/2016 0926   BUN 20 05/15/2016 0857   CREATININE 0.68 08/07/2016 0926   CALCIUM 9.3 08/07/2016 0926   GFRNONAA >60 08/07/2016 0926   GFRAA >60 08/07/2016 0926    Lipid Panel     Component Value Date/Time   CHOL 178 05/15/2016 0857   TRIG 106 05/15/2016 0857   HDL 53  05/15/2016 0857   CHOLHDL 3.4 05/15/2016 0857   LDLCALC 104 (H) 05/15/2016 0857    CBC    Component Value Date/Time   WBC 5.8 08/07/2016 0926   RBC 4.34 (L) 08/07/2016 0926   HGB 13.2 08/07/2016 0926   HCT 38.1 (L) 08/07/2016 0926   PLT 132 (L) 08/07/2016 0926   MCV 87.8 08/07/2016 0926   MCH 30.5 08/07/2016 0926   MCHC 34.7 08/07/2016 0926   RDW 15.0 (H) 08/07/2016 0926   LYMPHSABS 1.3 08/07/2016 0926   MONOABS 0.7 08/07/2016 0926   EOSABS 0.1 08/07/2016 0926   BASOSABS 0.0 08/07/2016 0926    Hgb A1C No results found for: HGBA1C         Assessment & Plan:

## 2018-03-14 NOTE — Assessment & Plan Note (Signed)
Controlled on Metoprolol and Eliquis Will monitor

## 2018-06-26 ENCOUNTER — Ambulatory Visit: Payer: Medicare Other | Admitting: Internal Medicine

## 2018-06-26 ENCOUNTER — Encounter: Payer: Self-pay | Admitting: Internal Medicine

## 2018-06-26 DIAGNOSIS — F39 Unspecified mood [affective] disorder: Secondary | ICD-10-CM | POA: Diagnosis not present

## 2018-06-26 DIAGNOSIS — I480 Paroxysmal atrial fibrillation: Secondary | ICD-10-CM | POA: Diagnosis not present

## 2018-06-26 DIAGNOSIS — F0280 Dementia in other diseases classified elsewhere without behavioral disturbance: Secondary | ICD-10-CM

## 2018-06-26 DIAGNOSIS — I2510 Atherosclerotic heart disease of native coronary artery without angina pectoris: Secondary | ICD-10-CM

## 2018-06-26 DIAGNOSIS — G301 Alzheimer's disease with late onset: Secondary | ICD-10-CM | POA: Diagnosis not present

## 2018-06-26 DIAGNOSIS — M5412 Radiculopathy, cervical region: Secondary | ICD-10-CM

## 2018-06-26 DIAGNOSIS — K219 Gastro-esophageal reflux disease without esophagitis: Secondary | ICD-10-CM

## 2018-06-26 NOTE — Assessment & Plan Note (Signed)
Chronic symptoms but no sig worsening and gets along without regular meds

## 2018-06-26 NOTE — Assessment & Plan Note (Signed)
Controlled by the famotidine

## 2018-06-26 NOTE — Progress Notes (Signed)
Subjective:    Patient ID: Austin [name], male    DOB: 04-11-1931, 84 y.o.   MRN: [id number]  HPI Visit in assisted living apartment for review of chronic health conditions Reviewed status with Luellen Pucker RN---does have increasing confusion  Still gets agitated--especially in evening Unable to work TV correctly then---upset at times This seems better with the daily diazepam at 4:30PM He is not depressed Enjoys the activities  Chronic neck pain Did have ESI in July He likes to amssage it No arm weakness  No chest pain No SOB No dizziness or syncope No edema No palpitations  No regular heartburn Denies swallowing problems  Current Outpatient Medications on File Prior to Visit  Medication Sig Dispense Refill  . acetaminophen (TYLENOL) 500 MG tablet Take 1 tablet by mouth 3 (three) times daily.     Marland Kitchen apixaban (ELIQUIS) 5 MG TABS tablet Take 1 tablet (5 mg total) by mouth 2 (two) times daily. 60 tablet 6  . Cholecalciferol (VITAMIN D3) 2000 units capsule Take 1 capsule by mouth daily.     . Coenzyme Q10 (COQ10) 200 MG CAPS Take by mouth.    . diazepam (VALIUM) 5 MG tablet Take 5 mg by mouth daily.     Marland Kitchen docusate sodium (COLACE) 100 MG capsule Take 100 mg by mouth daily.    Marland Kitchen donepezil (ARICEPT) 10 MG tablet Take 1 tablet (10 mg total) by mouth 2 (two) times daily. 60 tablet 2  . famotidine (PEPCID) 20 MG tablet Take 20 mg by mouth at bedtime.    . Magnesium Oxide 400 (240 Mg) MG TABS Take 1 tablet by mouth 2 (two) times daily.    . meclizine (ANTIVERT) 25 MG tablet Take 1 tablet (25 mg total) by mouth 3 (three) times daily as needed for dizziness or nausea. 30 tablet 1  . metoprolol succinate (TOPROL-XL) 50 MG 24 hr tablet Take 1 tablet (50 mg total) by mouth daily. Take with or immediately following a meal. 90 tablet 3  . Multiple Vitamin (MULTIVITAMIN WITH MINERALS) TABS tablet Take 1 tablet by mouth daily.    . Multiple Vitamins-Minerals (CENTRUM SILVER PO) Take 1 tablet by  mouth daily.    . ranitidine (ZANTAC) 150 MG tablet Take 1 tablet by mouth at bedtime.    . TURMERIC PO Take 1 tablet by mouth every evening.    . vitamin B-12 (CYANOCOBALAMIN) 1000 MCG tablet Take 1,000 mcg by mouth daily.     No current facility-administered medications on file prior to visit.     Allergies  Allergen Reactions  . Amiodarone Other (See Comments)    Trouble waking/falls  . Multaq [Dronedarone] Other (See Comments)    unknown  . Albuterol Palpitations    Past Medical History:  Diagnosis Date  . Alzheimer's dementia ([place])    ALZHEIMERS  . Atrial fibrillation [facility])    REVEAL DEVICE MEDTRONIC SERIAL #[id number] S MODEL #[id number]  . Coronary atherosclerosis of native coronary artery    MI 2014  . GERD (gastroesophageal reflux disease)    REFLUX  . Gout   . History of prostate cancer   . HTN (hypertension)   . LPRD (laryngopharyngeal reflux disease)   . Osteoarthritis, generalized    DJD neck and spine  . RBBB   . RBBB (right bundle branch block)     Past Surgical History:  Procedure Laterality Date  . ABDOMINAL ADHESION SURGERY    . APPENDECTOMY    . DIRECT LARYNGOSCOPY Left 04/19/2016   Procedure: DIRECT  MICROSCOPIC LARYNGOSCOPY WITH BIOPSY;  Surgeon: Margaretha Sheffield, MD;  Location: [place];  Service: ENT;  Laterality: Left;  . EP IMPLANTABLE DEVICE     Reveal device Medtronic Serial [id number]  Model [id number]  . INGUINAL HERNIA REPAIR Left   . KNEE SURGERY    . PROSTATECTOMY    . SHOULDER ARTHROSCOPY    . TREATMENT FISTULA ANAL      Family History  Problem Relation Age of Onset  . Hypertension Mother   . Chronic Renal Failure Mother   . Cancer Neg Hx   . Heart disease Neg Hx   . Diabetes Neg Hx   . Prostate cancer Neg Hx     Social History   Socioeconomic History  . Marital status: Widowed    Spouse name: Not on file  . Number of children: 3  . Years of education: Not on file  . Highest education level: Not on file  Occupational  History  . Occupation: Professor --Therapist, nutritional    Comment: [place]  . Financial resource strain: Not on file  . Food insecurity:    Worry: Not on file    Inability: Not on file  . Transportation needs:    Medical: Not on file    Non-medical: Not on file  Tobacco Use  . Smoking status: Never Smoker  . Smokeless tobacco: Never Used  Substance and Sexual Activity  . Alcohol use: No    Comment: 1 martini/ day  . Drug use: No  . Sexual activity: Not on file  Lifestyle  . Physical activity:    Days per week: Not on file    Minutes per session: Not on file  . Stress: Not on file  Relationships  . Social connections:    Talks on phone: Not on file    Gets together: Not on file    Attends religious service: Not on file    Active member of club or organization: Not on file    Attends meetings of clubs or organizations: Not on file    Relationship status: Not on file  . Intimate partner violence:    Fear of current or ex partner: Not on file    Emotionally abused: Not on file    Physically abused: Not on file    Forced sexual activity: Not on file  Other Topics Concern  . Not on file  Social History Narrative   Has living will   All three children are health care POA   Would accept resuscitation attempts   No tube feeds if cognitively unaware   Review of Systems Appetite is good Weight stable Sleeps well Still walks regularly Bowels are fine Had SCC removed from right ear will get the sutures removed tomorrow   Objective:   Physical Exam  Constitutional: He appears well-developed. No distress.  Neck: No thyromegaly present.  Cardiovascular: Normal rate. Exam reveals no gallop.  No murmur heard. Slightly irregular  Respiratory: Effort normal and breath sounds normal. No respiratory distress. He has no wheezes. He has no rales.  GI: Soft. There is no abdominal tenderness.  Musculoskeletal:        General: No tenderness or edema.    Lymphadenopathy:    He has no cervical adenopathy.  Neurological:  Forgot my appt again Repeats himself frequently (states same issue with neck pain time and again and the ear----and didn't remember me telling him about the surgery)  Skin:  Lesion top of right pinna  Psychiatric:  He has a normal mood and affect. His behavior is normal.           Assessment & Plan:

## 2018-06-26 NOTE — Assessment & Plan Note (Signed)
No symptoms currently Continues to walk regularly

## 2018-06-26 NOTE — Assessment & Plan Note (Signed)
Mild increase in confusion but doing okay in function with the supervision here in [place] On donepezil

## 2018-06-26 NOTE — Assessment & Plan Note (Signed)
Gets agitated especially in the evening Doing better lately with the regular diazepam at 4:30PM

## 2018-06-26 NOTE — Assessment & Plan Note (Signed)
Regular now Continues on eliquis

## 2018-10-24 ENCOUNTER — Ambulatory Visit: Payer: Medicare Other | Admitting: Internal Medicine

## 2018-10-24 ENCOUNTER — Encounter: Payer: Self-pay | Admitting: Internal Medicine

## 2018-10-24 VITALS — BP 151/77 | HR 46 | Temp 96.5°F | Resp 18 | Wt 162.0 lb

## 2018-10-24 DIAGNOSIS — F0280 Dementia in other diseases classified elsewhere without behavioral disturbance: Secondary | ICD-10-CM

## 2018-10-24 DIAGNOSIS — M159 Polyosteoarthritis, unspecified: Secondary | ICD-10-CM | POA: Diagnosis not present

## 2018-10-24 DIAGNOSIS — M1A9XX0 Chronic gout, unspecified, without tophus (tophi): Secondary | ICD-10-CM

## 2018-10-24 DIAGNOSIS — Z8546 Personal history of malignant neoplasm of prostate: Secondary | ICD-10-CM

## 2018-10-24 DIAGNOSIS — M67971 Unspecified disorder of synovium and tendon, right ankle and foot: Secondary | ICD-10-CM | POA: Diagnosis not present

## 2018-10-24 DIAGNOSIS — F419 Anxiety disorder, unspecified: Secondary | ICD-10-CM

## 2018-10-24 DIAGNOSIS — K219 Gastro-esophageal reflux disease without esophagitis: Secondary | ICD-10-CM | POA: Diagnosis not present

## 2018-10-24 DIAGNOSIS — G301 Alzheimer's disease with late onset: Secondary | ICD-10-CM

## 2018-10-24 DIAGNOSIS — I480 Paroxysmal atrial fibrillation: Secondary | ICD-10-CM

## 2018-10-24 DIAGNOSIS — I2510 Atherosclerotic heart disease of native coronary artery without angina pectoris: Secondary | ICD-10-CM

## 2018-10-24 NOTE — Assessment & Plan Note (Signed)
-   Continue Eliquis 

## 2018-10-24 NOTE — Patient Instructions (Signed)

## 2018-10-24 NOTE — Assessment & Plan Note (Signed)
Will continue Famotadine Encouraged him to avoid foods that trigger his reflux

## 2018-10-24 NOTE — Assessment & Plan Note (Signed)
No meds Will monitor 

## 2018-10-24 NOTE — Progress Notes (Signed)
Subjective:    Patient ID: Austin [name], male    DOB: August 09, 1930, 85 y.o.   MRN: [id number]  HPI  Resident due for routine follow up in [address] RN reports resident has been c/o pain in the right Achilles x 3 weeks. Pain with ambulation. RN reports redness and swelling of the right achilles. Completed Pred Taper x 2 with some improvement.  Changed multiple shoes to see if it is pressure related, doesn't seem to be Otherwise, doing well. Still waiting to Memory Care Sleeps well. Needing more supervision with bathing and dressing Appetite good, weight stable, bowels okay C/o persistent neck pain, denies reflux or SOB  OA: Pain controlled with Tylenol. He continues to stay active.  GERD: He denies breakthrough on Famotadine. He denies difficulty swallowing or choking sensation.  Hx of Prostate Cancer: s/p prostatectomy.   Gout: No recent flare off meds.  Afib: Paroxysmal. Stable on Eliquis and Metoprolol  Dementia: Mild cognitive and functional needs. He is taking Aricept as prescribed. Awaiting transfer to memory care.  CAD: No angina. On Eliquis.  Anxiety: Controlled with evening Valium. He denies depression, SI/HI.  Review of Systems      Past Medical History:  Diagnosis Date  . Alzheimer's dementia ([place])    ALZHEIMERS  . Atrial fibrillation [facility])    REVEAL DEVICE [employer] SERIAL #[id number] S MODEL #[id number]  . Coronary atherosclerosis of native coronary artery    MI 2014  . GERD (gastroesophageal reflux disease)    REFLUX  . Gout   . History of prostate cancer   . HTN (hypertension)   . LPRD (laryngopharyngeal reflux disease)   . Osteoarthritis, generalized    DJD neck and spine  . RBBB   . RBBB (right bundle branch block)     Current Outpatient Medications  Medication Sig Dispense Refill  . acetaminophen (TYLENOL) 500 MG tablet Take 1 tablet by mouth 3 (three) times daily.     Marland Kitchen apixaban (ELIQUIS) 5 MG TABS tablet Take 1 tablet (5 mg total) by mouth 2  (two) times daily. 60 tablet 6  . Cholecalciferol (VITAMIN D3) 2000 units capsule Take 1 capsule by mouth daily.     . Coenzyme Q10 (COQ10) 200 MG CAPS Take by mouth.    . diazepam (VALIUM) 5 MG tablet Take 5 mg by mouth daily.     Marland Kitchen docusate sodium (COLACE) 100 MG capsule Take 100 mg by mouth daily.    Marland Kitchen donepezil (ARICEPT) 10 MG tablet Take 1 tablet (10 mg total) by mouth 2 (two) times daily. 60 tablet 2  . famotidine (PEPCID) 20 MG tablet Take 20 mg by mouth at bedtime.    . Magnesium Oxide 400 (240 Mg) MG TABS Take 1 tablet by mouth 2 (two) times daily.    . meclizine (ANTIVERT) 25 MG tablet Take 1 tablet (25 mg total) by mouth 3 (three) times daily as needed for dizziness or nausea. 30 tablet 1  . metoprolol succinate (TOPROL-XL) 50 MG 24 hr tablet Take 1 tablet (50 mg total) by mouth daily. Take with or immediately following a meal. 90 tablet 3  . Multiple Vitamin (MULTIVITAMIN WITH MINERALS) TABS tablet Take 1 tablet by mouth daily.    . Multiple Vitamins-Minerals (CENTRUM SILVER PO) Take 1 tablet by mouth daily.    . ranitidine (ZANTAC) 150 MG tablet Take 1 tablet by mouth at bedtime.    . TURMERIC PO Take 1 tablet by mouth every evening.    . vitamin B-12 (CYANOCOBALAMIN)  1000 MCG tablet Take 1,000 mcg by mouth daily.     No current facility-administered medications for this visit.     Allergies  Allergen Reactions  . Amiodarone Other (See Comments)    Trouble waking/falls  . Multaq [Dronedarone] Other (See Comments)    unknown  . Albuterol Palpitations    Family History  Problem Relation Age of Onset  . Hypertension Mother   . Chronic Renal Failure Mother   . Cancer Neg Hx   . Heart disease Neg Hx   . Diabetes Neg Hx   . Prostate cancer Neg Hx     Social History   Socioeconomic History  . Marital status: Widowed    Spouse name: Not on file  . Number of children: 3  . Years of education: Not on file  . Highest education level: Not on file  Occupational History   . Occupation: Professor --Therapist, nutritional    Comment: [employer]  . Financial resource strain: Not on file  . Food insecurity:    Worry: Not on file    Inability: Not on file  . Transportation needs:    Medical: Not on file    Non-medical: Not on file  Tobacco Use  . Smoking status: Never Smoker  . Smokeless tobacco: Never Used  Substance and Sexual Activity  . Alcohol use: No    Comment: 1 martini/ day  . Drug use: No  . Sexual activity: Not on file  Lifestyle  . Physical activity:    Days per week: Not on file    Minutes per session: Not on file  . Stress: Not on file  Relationships  . Social connections:    Talks on phone: Not on file    Gets together: Not on file    Attends religious service: Not on file    Active member of club or organization: Not on file    Attends meetings of clubs or organizations: Not on file    Relationship status: Not on file  . Intimate partner violence:    Fear of current or ex partner: Not on file    Emotionally abused: Not on file    Physically abused: Not on file    Forced sexual activity: Not on file  Other Topics Concern  . Not on file  Social History Narrative   Has living will   All three children are health care POA   Would accept resuscitation attempts   No tube feeds if cognitively unaware     Constitutional: Denies fever, malaise, fatigue, headache or abrupt weight changes.  HEENT: Denies eye pain, eye redness, ear pain, ringing in the ears, wax buildup, runny nose, nasal congestion, bloody nose, or sore throat. Respiratory: Denies difficulty breathing, shortness of breath, cough or sputum production.   Cardiovascular: Denies chest pain, chest tightness, palpitations or swelling in the hands or feet.  Gastrointestinal: Denies abdominal pain, bloating, constipation, diarrhea or blood in the stool.  GU: Denies urgency, frequency, pain with urination, burning sensation, blood in urine, odor or discharge.  Musculoskeletal: Pt reports neck pain. Denies decrease in range of motion, difficulty with gait, muscle pain or joint swelling.  Skin: Denies redness, rashes, lesions or ulcercations.  Neurological: Pt reports memory issues. Denies dizziness, difficulty with speech or problems with balance and coordination.  Psych: Pt has a history of anxiety. Denies depression, SI/HI.  No other specific complaints in a complete review of systems (except as listed in HPI above).  Objective:  Physical Exam  BP (!) 151/77   Pulse (!) 46   Temp (!) 96.5 F (35.8 C)   Resp 18   Wt 162 lb (73.5 kg)   BMI 28.70 kg/m   Wt Readings from Last 3 Encounters:  03/14/18 157 lb 12.8 oz (71.6 kg)  10/02/17 158 lb 9.6 oz (71.9 kg)  06/20/17 154 lb (69.9 kg)    General: Appears his stated age, well developed, well nourished in NAD. Skin: Warm, dry and intact. 1 cm area of redness, swelling but no warmth noted of posterior inferior right Achilles tendon. Neck:  Neck supple, trachea midline. No masses, lumps or thyromegaly present.  Cardiovascular: Normal rate and rhythm. Murmur noted. No BLE edema noted. Pulmonary/Chest: Normal effort and positive vesicular breath sounds. No respiratory distress. No wheezes, rales or ronchi noted.  Abdomen: Soft and nontender.  Musculoskeletal: Gait slow and steady without device. Neurological: Alert and oriented.  Psychiatric: Mood and affect normal. Behavior is normal. Judgment and thought content normal.     BMET    Component Value Date/Time   NA 136 08/07/2016 0926   NA 144 05/15/2016 0857   K 3.6 08/07/2016 0926   CL 103 08/07/2016 0926   CO2 27 08/07/2016 0926   GLUCOSE 108 (H) 08/07/2016 0926   BUN 22 (H) 08/07/2016 0926   BUN 20 05/15/2016 0857   CREATININE 0.68 08/07/2016 0926   CALCIUM 9.3 08/07/2016 0926   GFRNONAA >60 08/07/2016 0926   GFRAA >60 08/07/2016 0926    Lipid Panel     Component Value Date/Time   CHOL 178 05/15/2016 0857   TRIG 106  05/15/2016 0857   HDL 53 05/15/2016 0857   CHOLHDL 3.4 05/15/2016 0857   LDLCALC 104 (H) 05/15/2016 0857    CBC    Component Value Date/Time   WBC 5.8 08/07/2016 0926   RBC 4.34 (L) 08/07/2016 0926   HGB 13.2 08/07/2016 0926   HCT 38.1 (L) 08/07/2016 0926   PLT 132 (L) 08/07/2016 0926   MCV 87.8 08/07/2016 0926   MCH 30.5 08/07/2016 0926   MCHC 34.7 08/07/2016 0926   RDW 15.0 (H) 08/07/2016 0926   LYMPHSABS 1.3 08/07/2016 0926   MONOABS 0.7 08/07/2016 0926   EOSABS 0.1 08/07/2016 0926   BASOSABS 0.0 08/07/2016 0926    Hgb A1C No results found for: HGBA1C          Assessment & Plan:   Achilles Tendon Disorder:  S/p Pred Taper x 2 Some improvement Will discuss further with Dr. Silvio Pate and see if anything needs to be done

## 2018-10-24 NOTE — Assessment & Plan Note (Signed)
Continue Valium Wean not indicated at this time Will monitor

## 2018-10-24 NOTE — Assessment & Plan Note (Signed)
Continue scheduled Tylenol Encouraged regular physical activity

## 2018-10-24 NOTE — Assessment & Plan Note (Signed)
In remission Will monitor 

## 2018-10-24 NOTE — Assessment & Plan Note (Signed)
Continue Metoprolol and Eliquis 

## 2018-10-24 NOTE — Assessment & Plan Note (Signed)
Appreciate ALF care Continue Aricept for now Will monitor

## 2018-11-07 ENCOUNTER — Telehealth: Payer: Self-pay

## 2018-11-07 NOTE — Telephone Encounter (Signed)
Spoke with patient's daughter to schedule overdue F/U. She states he is at [facility] and seems to be doing well. She declined a telephone or video visit at this time. She states he is getting ready to be moved to a memory unit but she would like to possibly schedule an appointment in the fall. Informed her that patient will be placed on the 01/31/2019 recall list. Daughter expressed gratitude for the call.

## 2018-12-01 DIAGNOSIS — M5412 Radiculopathy, cervical region: Secondary | ICD-10-CM

## 2018-12-01 DIAGNOSIS — I2510 Atherosclerotic heart disease of native coronary artery without angina pectoris: Secondary | ICD-10-CM

## 2018-12-01 DIAGNOSIS — G301 Alzheimer's disease with late onset: Secondary | ICD-10-CM

## 2018-12-01 DIAGNOSIS — F39 Unspecified mood [affective] disorder: Secondary | ICD-10-CM

## 2018-12-01 DIAGNOSIS — I480 Paroxysmal atrial fibrillation: Secondary | ICD-10-CM

## 2019-01-14 DIAGNOSIS — I4891 Unspecified atrial fibrillation: Secondary | ICD-10-CM

## 2019-01-14 DIAGNOSIS — I2510 Atherosclerotic heart disease of native coronary artery without angina pectoris: Secondary | ICD-10-CM

## 2019-01-14 DIAGNOSIS — M5412 Radiculopathy, cervical region: Secondary | ICD-10-CM

## 2019-01-14 DIAGNOSIS — F39 Unspecified mood [affective] disorder: Secondary | ICD-10-CM

## 2019-01-14 DIAGNOSIS — G309 Alzheimer's disease, unspecified: Secondary | ICD-10-CM

## 2019-01-14 DIAGNOSIS — K219 Gastro-esophageal reflux disease without esophagitis: Secondary | ICD-10-CM

## 2019-03-18 DIAGNOSIS — G309 Alzheimer's disease, unspecified: Secondary | ICD-10-CM

## 2019-03-18 DIAGNOSIS — F39 Unspecified mood [affective] disorder: Secondary | ICD-10-CM

## 2019-03-18 DIAGNOSIS — I2510 Atherosclerotic heart disease of native coronary artery without angina pectoris: Secondary | ICD-10-CM | POA: Diagnosis not present

## 2019-03-18 DIAGNOSIS — I4891 Unspecified atrial fibrillation: Secondary | ICD-10-CM

## 2019-03-18 DIAGNOSIS — K219 Gastro-esophageal reflux disease without esophagitis: Secondary | ICD-10-CM

## 2019-03-18 DIAGNOSIS — M1990 Unspecified osteoarthritis, unspecified site: Secondary | ICD-10-CM

## 2019-04-10 DIAGNOSIS — G459 Transient cerebral ischemic attack, unspecified: Secondary | ICD-10-CM

## 2019-05-04 DIAGNOSIS — K0889 Other specified disorders of teeth and supporting structures: Secondary | ICD-10-CM

## 2019-05-08 DIAGNOSIS — K219 Gastro-esophageal reflux disease without esophagitis: Secondary | ICD-10-CM

## 2019-05-08 DIAGNOSIS — I4891 Unspecified atrial fibrillation: Secondary | ICD-10-CM

## 2019-05-08 DIAGNOSIS — U071 COVID-19: Secondary | ICD-10-CM

## 2019-05-08 DIAGNOSIS — I2510 Atherosclerotic heart disease of native coronary artery without angina pectoris: Secondary | ICD-10-CM

## 2019-05-08 DIAGNOSIS — G309 Alzheimer's disease, unspecified: Secondary | ICD-10-CM

## 2019-05-08 DIAGNOSIS — M5412 Radiculopathy, cervical region: Secondary | ICD-10-CM

## 2019-05-08 DIAGNOSIS — G459 Transient cerebral ischemic attack, unspecified: Secondary | ICD-10-CM

## 2019-05-08 DIAGNOSIS — M1990 Unspecified osteoarthritis, unspecified site: Secondary | ICD-10-CM

## 2019-05-08 DIAGNOSIS — F39 Unspecified mood [affective] disorder: Secondary | ICD-10-CM

## 2019-07-08 DIAGNOSIS — G309 Alzheimer's disease, unspecified: Secondary | ICD-10-CM | POA: Diagnosis not present

## 2019-07-08 DIAGNOSIS — I4891 Unspecified atrial fibrillation: Secondary | ICD-10-CM | POA: Diagnosis not present

## 2019-07-08 DIAGNOSIS — F39 Unspecified mood [affective] disorder: Secondary | ICD-10-CM | POA: Diagnosis not present

## 2019-07-08 DIAGNOSIS — M5412 Radiculopathy, cervical region: Secondary | ICD-10-CM | POA: Diagnosis not present

## 2019-07-08 DIAGNOSIS — K219 Gastro-esophageal reflux disease without esophagitis: Secondary | ICD-10-CM | POA: Diagnosis not present

## 2019-07-08 DIAGNOSIS — I2510 Atherosclerotic heart disease of native coronary artery without angina pectoris: Secondary | ICD-10-CM | POA: Diagnosis not present

## 2019-07-23 DIAGNOSIS — R42 Dizziness and giddiness: Secondary | ICD-10-CM | POA: Diagnosis not present

## 2019-07-29 DIAGNOSIS — G309 Alzheimer's disease, unspecified: Secondary | ICD-10-CM | POA: Diagnosis not present

## 2019-07-29 DIAGNOSIS — I4891 Unspecified atrial fibrillation: Secondary | ICD-10-CM | POA: Diagnosis not present

## 2019-07-29 DIAGNOSIS — M7661 Achilles tendinitis, right leg: Secondary | ICD-10-CM | POA: Diagnosis not present

## 2019-07-31 DIAGNOSIS — M7661 Achilles tendinitis, right leg: Secondary | ICD-10-CM | POA: Diagnosis not present

## 2019-07-31 DIAGNOSIS — I4891 Unspecified atrial fibrillation: Secondary | ICD-10-CM | POA: Diagnosis not present

## 2019-07-31 DIAGNOSIS — G309 Alzheimer's disease, unspecified: Secondary | ICD-10-CM | POA: Diagnosis not present

## 2019-08-03 DIAGNOSIS — M7661 Achilles tendinitis, right leg: Secondary | ICD-10-CM | POA: Diagnosis not present

## 2019-08-03 DIAGNOSIS — G309 Alzheimer's disease, unspecified: Secondary | ICD-10-CM | POA: Diagnosis not present

## 2019-08-03 DIAGNOSIS — I4891 Unspecified atrial fibrillation: Secondary | ICD-10-CM | POA: Diagnosis not present

## 2019-08-07 DIAGNOSIS — M7661 Achilles tendinitis, right leg: Secondary | ICD-10-CM | POA: Diagnosis not present

## 2019-08-07 DIAGNOSIS — I4891 Unspecified atrial fibrillation: Secondary | ICD-10-CM | POA: Diagnosis not present

## 2019-08-07 DIAGNOSIS — G309 Alzheimer's disease, unspecified: Secondary | ICD-10-CM | POA: Diagnosis not present

## 2019-08-10 DIAGNOSIS — M7661 Achilles tendinitis, right leg: Secondary | ICD-10-CM | POA: Diagnosis not present

## 2019-08-10 DIAGNOSIS — I4891 Unspecified atrial fibrillation: Secondary | ICD-10-CM | POA: Diagnosis not present

## 2019-08-10 DIAGNOSIS — G309 Alzheimer's disease, unspecified: Secondary | ICD-10-CM | POA: Diagnosis not present

## 2019-08-12 DIAGNOSIS — G309 Alzheimer's disease, unspecified: Secondary | ICD-10-CM | POA: Diagnosis not present

## 2019-08-12 DIAGNOSIS — M7661 Achilles tendinitis, right leg: Secondary | ICD-10-CM | POA: Diagnosis not present

## 2019-08-12 DIAGNOSIS — I4891 Unspecified atrial fibrillation: Secondary | ICD-10-CM | POA: Diagnosis not present

## 2019-08-14 DIAGNOSIS — M7661 Achilles tendinitis, right leg: Secondary | ICD-10-CM | POA: Diagnosis not present

## 2019-08-14 DIAGNOSIS — I4891 Unspecified atrial fibrillation: Secondary | ICD-10-CM | POA: Diagnosis not present

## 2019-08-14 DIAGNOSIS — G309 Alzheimer's disease, unspecified: Secondary | ICD-10-CM | POA: Diagnosis not present

## 2019-08-17 DIAGNOSIS — I4891 Unspecified atrial fibrillation: Secondary | ICD-10-CM | POA: Diagnosis not present

## 2019-08-17 DIAGNOSIS — G309 Alzheimer's disease, unspecified: Secondary | ICD-10-CM | POA: Diagnosis not present

## 2019-08-17 DIAGNOSIS — M7661 Achilles tendinitis, right leg: Secondary | ICD-10-CM | POA: Diagnosis not present

## 2019-08-19 DIAGNOSIS — G309 Alzheimer's disease, unspecified: Secondary | ICD-10-CM | POA: Diagnosis not present

## 2019-08-19 DIAGNOSIS — M7661 Achilles tendinitis, right leg: Secondary | ICD-10-CM | POA: Diagnosis not present

## 2019-08-19 DIAGNOSIS — I4891 Unspecified atrial fibrillation: Secondary | ICD-10-CM | POA: Diagnosis not present

## 2019-08-21 DIAGNOSIS — I4891 Unspecified atrial fibrillation: Secondary | ICD-10-CM | POA: Diagnosis not present

## 2019-08-21 DIAGNOSIS — G309 Alzheimer's disease, unspecified: Secondary | ICD-10-CM | POA: Diagnosis not present

## 2019-08-21 DIAGNOSIS — M7661 Achilles tendinitis, right leg: Secondary | ICD-10-CM | POA: Diagnosis not present

## 2019-08-24 DIAGNOSIS — I4891 Unspecified atrial fibrillation: Secondary | ICD-10-CM | POA: Diagnosis not present

## 2019-08-24 DIAGNOSIS — G309 Alzheimer's disease, unspecified: Secondary | ICD-10-CM | POA: Diagnosis not present

## 2019-08-24 DIAGNOSIS — M7661 Achilles tendinitis, right leg: Secondary | ICD-10-CM | POA: Diagnosis not present

## 2019-08-26 DIAGNOSIS — G309 Alzheimer's disease, unspecified: Secondary | ICD-10-CM | POA: Diagnosis not present

## 2019-08-26 DIAGNOSIS — M7661 Achilles tendinitis, right leg: Secondary | ICD-10-CM | POA: Diagnosis not present

## 2019-08-26 DIAGNOSIS — I4891 Unspecified atrial fibrillation: Secondary | ICD-10-CM | POA: Diagnosis not present

## 2019-08-28 DIAGNOSIS — M7661 Achilles tendinitis, right leg: Secondary | ICD-10-CM | POA: Diagnosis not present

## 2019-08-28 DIAGNOSIS — G309 Alzheimer's disease, unspecified: Secondary | ICD-10-CM | POA: Diagnosis not present

## 2019-08-28 DIAGNOSIS — I4891 Unspecified atrial fibrillation: Secondary | ICD-10-CM | POA: Diagnosis not present

## 2019-08-31 DIAGNOSIS — R2681 Unsteadiness on feet: Secondary | ICD-10-CM | POA: Diagnosis not present

## 2019-08-31 DIAGNOSIS — G309 Alzheimer's disease, unspecified: Secondary | ICD-10-CM | POA: Diagnosis not present

## 2019-09-04 DIAGNOSIS — F39 Unspecified mood [affective] disorder: Secondary | ICD-10-CM | POA: Diagnosis not present

## 2019-09-04 DIAGNOSIS — G459 Transient cerebral ischemic attack, unspecified: Secondary | ICD-10-CM | POA: Diagnosis not present

## 2019-09-04 DIAGNOSIS — M5412 Radiculopathy, cervical region: Secondary | ICD-10-CM | POA: Diagnosis not present

## 2019-09-04 DIAGNOSIS — I2510 Atherosclerotic heart disease of native coronary artery without angina pectoris: Secondary | ICD-10-CM | POA: Diagnosis not present

## 2019-09-04 DIAGNOSIS — K219 Gastro-esophageal reflux disease without esophagitis: Secondary | ICD-10-CM | POA: Diagnosis not present

## 2019-09-04 DIAGNOSIS — I4891 Unspecified atrial fibrillation: Secondary | ICD-10-CM | POA: Diagnosis not present

## 2019-09-04 DIAGNOSIS — G309 Alzheimer's disease, unspecified: Secondary | ICD-10-CM | POA: Diagnosis not present

## 2019-09-07 DIAGNOSIS — L03031 Cellulitis of right toe: Secondary | ICD-10-CM | POA: Diagnosis not present

## 2019-09-11 DIAGNOSIS — M10071 Idiopathic gout, right ankle and foot: Secondary | ICD-10-CM | POA: Diagnosis not present

## 2019-09-14 DIAGNOSIS — M79621 Pain in right upper arm: Secondary | ICD-10-CM | POA: Diagnosis not present

## 2019-09-14 DIAGNOSIS — W19XXXA Unspecified fall, initial encounter: Secondary | ICD-10-CM | POA: Diagnosis not present

## 2019-09-14 DIAGNOSIS — M25511 Pain in right shoulder: Secondary | ICD-10-CM | POA: Diagnosis not present

## 2019-09-14 DIAGNOSIS — M25521 Pain in right elbow: Secondary | ICD-10-CM | POA: Diagnosis not present

## 2019-09-16 DIAGNOSIS — M25511 Pain in right shoulder: Secondary | ICD-10-CM | POA: Diagnosis not present

## 2019-09-16 DIAGNOSIS — G309 Alzheimer's disease, unspecified: Secondary | ICD-10-CM | POA: Diagnosis not present

## 2019-09-16 DIAGNOSIS — Y92129 Unspecified place in nursing home as the place of occurrence of the external cause: Secondary | ICD-10-CM | POA: Diagnosis not present

## 2019-09-18 DIAGNOSIS — M25512 Pain in left shoulder: Secondary | ICD-10-CM | POA: Diagnosis not present

## 2019-09-18 DIAGNOSIS — S46911A Strain of unspecified muscle, fascia and tendon at shoulder and upper arm level, right arm, initial encounter: Secondary | ICD-10-CM | POA: Diagnosis not present

## 2019-09-18 DIAGNOSIS — M542 Cervicalgia: Secondary | ICD-10-CM | POA: Diagnosis not present

## 2019-09-20 ENCOUNTER — Other Ambulatory Visit: Payer: Self-pay

## 2019-09-20 ENCOUNTER — Emergency Department: Payer: Medicare PPO

## 2019-09-20 ENCOUNTER — Encounter: Payer: Self-pay | Admitting: Emergency Medicine

## 2019-09-20 ENCOUNTER — Emergency Department
Admission: EM | Admit: 2019-09-20 | Discharge: 2019-09-20 | Disposition: A | Discharge status: Home / Self Care | Payer: Medicare PPO | Attending: Emergency Medicine | Admitting: Emergency Medicine

## 2019-09-20 DIAGNOSIS — W010XXA Fall on same level from slipping, tripping and stumbling without subsequent striking against object, initial encounter: Secondary | ICD-10-CM | POA: Insufficient documentation

## 2019-09-20 DIAGNOSIS — M542 Cervicalgia: Secondary | ICD-10-CM | POA: Insufficient documentation

## 2019-09-20 DIAGNOSIS — I2510 Atherosclerotic heart disease of native coronary artery without angina pectoris: Secondary | ICD-10-CM | POA: Insufficient documentation

## 2019-09-20 DIAGNOSIS — S0990XA Unspecified injury of head, initial encounter: Secondary | ICD-10-CM | POA: Diagnosis not present

## 2019-09-20 DIAGNOSIS — Z7901 Long term (current) use of anticoagulants: Secondary | ICD-10-CM | POA: Diagnosis not present

## 2019-09-20 DIAGNOSIS — Z79899 Other long term (current) drug therapy: Secondary | ICD-10-CM | POA: Insufficient documentation

## 2019-09-20 DIAGNOSIS — I10 Essential (primary) hypertension: Secondary | ICD-10-CM | POA: Diagnosis not present

## 2019-09-20 DIAGNOSIS — Y929 Unspecified place or not applicable: Secondary | ICD-10-CM | POA: Diagnosis not present

## 2019-09-20 DIAGNOSIS — S199XXA Unspecified injury of neck, initial encounter: Secondary | ICD-10-CM | POA: Diagnosis not present

## 2019-09-20 DIAGNOSIS — W19XXXA Unspecified fall, initial encounter: Secondary | ICD-10-CM | POA: Diagnosis not present

## 2019-09-20 DIAGNOSIS — Y999 Unspecified external cause status: Secondary | ICD-10-CM | POA: Insufficient documentation

## 2019-09-20 DIAGNOSIS — R52 Pain, unspecified: Secondary | ICD-10-CM | POA: Diagnosis not present

## 2019-09-20 DIAGNOSIS — G309 Alzheimer's disease, unspecified: Secondary | ICD-10-CM | POA: Insufficient documentation

## 2019-09-20 DIAGNOSIS — R5381 Other malaise: Secondary | ICD-10-CM | POA: Diagnosis not present

## 2019-09-20 DIAGNOSIS — Y939 Activity, unspecified: Secondary | ICD-10-CM | POA: Insufficient documentation

## 2019-09-20 DIAGNOSIS — R519 Headache, unspecified: Secondary | ICD-10-CM | POA: Diagnosis not present

## 2019-09-20 LAB — CBC
HCT: 39.1 % (ref 39.0–52.0)
Hemoglobin: 12.7 g/dL — ABNORMAL LOW (ref 13.0–17.0)
MCH: 30.7 pg (ref 26.0–34.0)
MCHC: 32.5 g/dL (ref 30.0–36.0)
MCV: 94.4 fL (ref 80.0–100.0)
Platelets: 197 10*3/uL (ref 150–400)
RBC: 4.14 MIL/uL — ABNORMAL LOW (ref 4.22–5.81)
RDW: 14.5 % (ref 11.5–15.5)
WBC: 8.8 10*3/uL (ref 4.0–10.5)
nRBC: 0 % (ref 0.0–0.2)

## 2019-09-20 LAB — BASIC METABOLIC PANEL
Anion gap: 9 (ref 5–15)
BUN: 30 mg/dL — ABNORMAL HIGH (ref 8–23)
CO2: 29 mmol/L (ref 22–32)
Calcium: 9.6 mg/dL (ref 8.9–10.3)
Chloride: 104 mmol/L (ref 98–111)
Creatinine, Ser: 0.87 mg/dL (ref 0.61–1.24)
GFR calc Af Amer: >60 mL/min (ref >60)
GFR calc non Af Amer: >60 mL/min (ref >60)
Glucose, Bld: 116 mg/dL — ABNORMAL HIGH (ref 70–99)
Potassium: 3.8 mmol/L (ref 3.5–5.1)
Sodium: 142 mmol/L (ref 135–145)

## 2019-09-20 LAB — GLUCOSE, CAPILLARY: Glucose-Capillary: 107 mg/dL — ABNORMAL HIGH (ref 70–99)
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
## 2019-09-20 NOTE — ED Notes (Signed)
Pt take to ct via ct tech

## 2019-09-20 NOTE — ED Notes (Signed)
Daughter signed discharge paper and placed in med rec box

## 2019-09-20 NOTE — ED Triage Notes (Signed)
Pt fell Monday.  From [facility].  Has alzheimer's.  Pt has been c/o severe headache and neck pain since. On blood thinner. Sent to ED for further eval.

## 2019-09-20 NOTE — ED Notes (Signed)
Given ginger ale. [place] with dr Jacqualine Code

## 2019-09-21 DIAGNOSIS — M5481 Occipital neuralgia: Secondary | ICD-10-CM | POA: Diagnosis not present

## 2019-09-22 DIAGNOSIS — M25511 Pain in right shoulder: Secondary | ICD-10-CM | POA: Diagnosis not present

## 2019-09-22 DIAGNOSIS — M542 Cervicalgia: Secondary | ICD-10-CM | POA: Diagnosis not present

## 2019-09-22 DIAGNOSIS — S46911D Strain of unspecified muscle, fascia and tendon at shoulder and upper arm level, right arm, subsequent encounter: Secondary | ICD-10-CM | POA: Diagnosis not present

## 2019-09-22 DIAGNOSIS — M6281 Muscle weakness (generalized): Secondary | ICD-10-CM | POA: Diagnosis not present

## 2019-09-29 DIAGNOSIS — M6281 Muscle weakness (generalized): Secondary | ICD-10-CM | POA: Diagnosis not present

## 2019-09-29 DIAGNOSIS — S46911D Strain of unspecified muscle, fascia and tendon at shoulder and upper arm level, right arm, subsequent encounter: Secondary | ICD-10-CM | POA: Diagnosis not present

## 2019-09-29 DIAGNOSIS — M25511 Pain in right shoulder: Secondary | ICD-10-CM | POA: Diagnosis not present

## 2019-09-29 DIAGNOSIS — M542 Cervicalgia: Secondary | ICD-10-CM | POA: Diagnosis not present

## 2019-10-01 DIAGNOSIS — M6281 Muscle weakness (generalized): Secondary | ICD-10-CM | POA: Diagnosis not present

## 2019-10-01 DIAGNOSIS — M25511 Pain in right shoulder: Secondary | ICD-10-CM | POA: Diagnosis not present

## 2019-10-01 DIAGNOSIS — S46911D Strain of unspecified muscle, fascia and tendon at shoulder and upper arm level, right arm, subsequent encounter: Secondary | ICD-10-CM | POA: Diagnosis not present

## 2019-10-01 DIAGNOSIS — M542 Cervicalgia: Secondary | ICD-10-CM | POA: Diagnosis not present

## 2019-10-06 DIAGNOSIS — M25511 Pain in right shoulder: Secondary | ICD-10-CM | POA: Diagnosis not present

## 2019-10-06 DIAGNOSIS — M542 Cervicalgia: Secondary | ICD-10-CM | POA: Diagnosis not present

## 2019-10-06 DIAGNOSIS — S46911D Strain of unspecified muscle, fascia and tendon at shoulder and upper arm level, right arm, subsequent encounter: Secondary | ICD-10-CM | POA: Diagnosis not present

## 2019-10-06 DIAGNOSIS — M6281 Muscle weakness (generalized): Secondary | ICD-10-CM | POA: Diagnosis not present

## 2019-10-08 DIAGNOSIS — M25511 Pain in right shoulder: Secondary | ICD-10-CM | POA: Diagnosis not present

## 2019-10-08 DIAGNOSIS — M6281 Muscle weakness (generalized): Secondary | ICD-10-CM | POA: Diagnosis not present

## 2019-10-08 DIAGNOSIS — S46911D Strain of unspecified muscle, fascia and tendon at shoulder and upper arm level, right arm, subsequent encounter: Secondary | ICD-10-CM | POA: Diagnosis not present

## 2019-10-08 DIAGNOSIS — M542 Cervicalgia: Secondary | ICD-10-CM | POA: Diagnosis not present

## 2019-10-13 ENCOUNTER — Other Ambulatory Visit: Payer: Self-pay

## 2019-10-13 ENCOUNTER — Emergency Department: Payer: Medicare PPO

## 2019-10-13 ENCOUNTER — Encounter: Payer: Self-pay | Admitting: *Deleted

## 2019-10-13 ENCOUNTER — Emergency Department
Admission: EM | Admit: 2019-10-13 | Discharge: 2019-10-14 | Disposition: A | Discharge status: Home / Self Care | Payer: Medicare PPO | Attending: Student in an Organized Health Care Education/Training Program | Admitting: Student in an Organized Health Care Education/Training Program

## 2019-10-13 DIAGNOSIS — M6281 Muscle weakness (generalized): Secondary | ICD-10-CM | POA: Diagnosis not present

## 2019-10-13 DIAGNOSIS — I2510 Atherosclerotic heart disease of native coronary artery without angina pectoris: Secondary | ICD-10-CM | POA: Diagnosis not present

## 2019-10-13 DIAGNOSIS — M25511 Pain in right shoulder: Secondary | ICD-10-CM | POA: Diagnosis not present

## 2019-10-13 DIAGNOSIS — I10 Essential (primary) hypertension: Secondary | ICD-10-CM | POA: Diagnosis not present

## 2019-10-13 DIAGNOSIS — S46911D Strain of unspecified muscle, fascia and tendon at shoulder and upper arm level, right arm, subsequent encounter: Secondary | ICD-10-CM | POA: Diagnosis not present

## 2019-10-13 DIAGNOSIS — Z8546 Personal history of malignant neoplasm of prostate: Secondary | ICD-10-CM | POA: Diagnosis not present

## 2019-10-13 DIAGNOSIS — R531 Weakness: Secondary | ICD-10-CM | POA: Diagnosis not present

## 2019-10-13 DIAGNOSIS — Z79899 Other long term (current) drug therapy: Secondary | ICD-10-CM | POA: Insufficient documentation

## 2019-10-13 DIAGNOSIS — R400 Somnolence: Secondary | ICD-10-CM | POA: Diagnosis not present

## 2019-10-13 DIAGNOSIS — R262 Difficulty in walking, not elsewhere classified: Secondary | ICD-10-CM | POA: Insufficient documentation

## 2019-10-13 DIAGNOSIS — F0280 Dementia in other diseases classified elsewhere without behavioral disturbance: Secondary | ICD-10-CM | POA: Diagnosis not present

## 2019-10-13 DIAGNOSIS — Z7901 Long term (current) use of anticoagulants: Secondary | ICD-10-CM | POA: Insufficient documentation

## 2019-10-13 DIAGNOSIS — G309 Alzheimer's disease, unspecified: Secondary | ICD-10-CM | POA: Diagnosis not present

## 2019-10-13 DIAGNOSIS — M542 Cervicalgia: Secondary | ICD-10-CM | POA: Diagnosis not present

## 2019-10-13 DIAGNOSIS — R001 Bradycardia, unspecified: Secondary | ICD-10-CM | POA: Diagnosis not present

## 2019-10-13 DIAGNOSIS — Z950 Presence of cardiac pacemaker: Secondary | ICD-10-CM | POA: Insufficient documentation

## 2019-10-13 DIAGNOSIS — R410 Disorientation, unspecified: Secondary | ICD-10-CM | POA: Diagnosis not present

## 2019-10-13 DIAGNOSIS — F0390 Unspecified dementia without behavioral disturbance: Secondary | ICD-10-CM | POA: Diagnosis not present

## 2019-10-13 LAB — URINALYSIS, COMPLETE (UACMP) WITH MICROSCOPIC
Bacteria, UA: NONE SEEN
Bilirubin Urine: NEGATIVE
Glucose, UA: NEGATIVE mg/dL
Hgb urine dipstick: NEGATIVE
Ketones, ur: NEGATIVE mg/dL
Leukocytes,Ua: NEGATIVE
Nitrite: NEGATIVE
Protein, ur: NEGATIVE mg/dL
Specific Gravity, Urine: 1.006 (ref 1.005–1.030)
pH: 6 (ref 5.0–8.0)

## 2019-10-13 LAB — CBC
HCT: 41.1 % (ref 39.0–52.0)
Hemoglobin: 13.1 g/dL (ref 13.0–17.0)
MCH: 30.5 pg (ref 26.0–34.0)
MCHC: 31.9 g/dL (ref 30.0–36.0)
MCV: 95.8 fL (ref 80.0–100.0)
Platelets: 178 10*3/uL (ref 150–400)
RBC: 4.29 MIL/uL (ref 4.22–5.81)
RDW: 14.6 % (ref 11.5–15.5)
WBC: 6.7 10*3/uL (ref 4.0–10.5)
nRBC: 0 % (ref 0.0–0.2)

## 2019-10-13 LAB — COMPREHENSIVE METABOLIC PANEL
ALT: 11 U/L (ref 0–44)
AST: 19 U/L (ref 15–41)
Albumin: 4.3 g/dL (ref 3.5–5.0)
Alkaline Phosphatase: 61 U/L (ref 38–126)
Anion gap: 9 (ref 5–15)
BUN: 25 mg/dL — ABNORMAL HIGH (ref 8–23)
CO2: 28 mmol/L (ref 22–32)
Calcium: 9.6 mg/dL (ref 8.9–10.3)
Chloride: 103 mmol/L (ref 98–111)
Creatinine, Ser: 1 mg/dL (ref 0.61–1.24)
GFR calc Af Amer: >60 mL/min (ref >60)
GFR calc non Af Amer: >60 mL/min (ref >60)
Glucose, Bld: 137 mg/dL — ABNORMAL HIGH (ref 70–99)
Potassium: 4 mmol/L (ref 3.5–5.1)
Sodium: 140 mmol/L (ref 135–145)
Total Bilirubin: 0.8 mg/dL (ref 0.3–1.2)
Total Protein: 7.2 g/dL (ref 6.5–8.1)

## 2019-10-13 NOTE — ED Provider Notes (Signed)
[facility] Emergency Department Provider Note    First MD Initiated Contact with Patient 10/13/19 2208     (approximate)  I have reviewed the triage vital signs and the nursing notes.   HISTORY  Chief Complaint Altered Mental Status    HPI Austin [name] is a 86 y.o. male with the below listed past medical history presents to the ER for generalized confusion/drowsiness and difficulty walking that occurred around dinner tonight.  No lateralizing weakness.  Does have a history of stroke.  Is on multiple medications including Valium tramadol and recently Norco for chronic neck and shoulder pain.  Was sent to the ER from memory care facility for evaluation.  He denies any complaints at this time.    Past Medical History:  Diagnosis Date  . Alzheimer's dementia ([place])    ALZHEIMERS  . Atrial fibrillation [facility])    REVEAL DEVICE MEDTRONIC SERIAL [id number] MODEL [id number]  . Coronary atherosclerosis of native coronary artery    MI 2014  . GERD (gastroesophageal reflux disease)    REFLUX  . Gout   . History of prostate cancer   . HTN (hypertension)   . LPRD (laryngopharyngeal reflux disease)   . Osteoarthritis, generalized    DJD neck and spine  . RBBB   . RBBB (right bundle branch block)    Family History  Problem Relation Age of Onset  . Hypertension Mother   . Chronic Renal Failure Mother   . Cancer Neg Hx   . Heart disease Neg Hx   . Diabetes Neg Hx   . Prostate cancer Neg Hx    Past Surgical History:  Procedure Laterality Date  . ABDOMINAL ADHESION SURGERY    . APPENDECTOMY    . DIRECT LARYNGOSCOPY Left 04/19/2016   Procedure: DIRECT MICROSCOPIC LARYNGOSCOPY WITH BIOPSY;  Surgeon: Margaretha Sheffield, MD;  Location: [place];  Service: ENT;  Laterality: Left;  . EP IMPLANTABLE DEVICE     Reveal device Medtronic Serial [id number]  Model [id number]  . INGUINAL HERNIA REPAIR Left   . KNEE SURGERY    . PROSTATECTOMY    . SHOULDER  ARTHROSCOPY    . TREATMENT FISTULA ANAL     Patient Active Problem List   Diagnosis Date Noted  . Gout 03/14/2018  . Anxiety 03/14/2018  . Cervical radiculopathy 11/29/2016  . Mood disorder ([place]) 01/12/2016  . Coronary atherosclerosis of native coronary artery   . LPRD (laryngopharyngeal reflux disease)   . History of prostate cancer   . Paroxysmal atrial fibrillation (H[facility])   . Osteoarthritis, generalized   . Alzheimer's dementia [facility])       Prior to Admission medications   Medication Sig Start Date End Date Taking? Authorizing Provider  apixaban (ELIQUIS) 5 MG TABS tablet Take 1 tablet (5 mg total) by mouth 2 (two) times daily. 02/22/16   Wende Bushy, MD  diazepam (VALIUM) 5 MG tablet Take 5 mg by mouth daily.     [provider]  docusate sodium (COLACE) 100 MG capsule Take 100 mg by mouth at bedtime.     [provider]  donepezil (ARICEPT) 10 MG tablet Take 1 tablet (10 mg total) by mouth 2 (two) times daily. Patient taking differently: Take 10 mg by mouth at bedtime.  10/02/17   Jearld Fenton, NP  famotidine (PEPCID) 20 MG tablet Take 20 mg by mouth at bedtime.    [provider]  HYDROcodone-acetaminophen (NORCO/VICODIN) 5-325 MG tablet Take 1 tablet by mouth every  6 (six) hours as needed for moderate pain.    [provider]  meclizine (ANTIVERT) 25 MG tablet Take 1 tablet (25 mg total) by mouth 3 (three) times daily as needed for dizziness or nausea. Patient not taking: Reported on 10/24/2018 08/07/16   Earleen Newport, MD  memantine (NAMENDA) 5 MG tablet Take 5 mg by mouth 2 (two) times daily.    [provider]  metoprolol succinate (TOPROL-XL) 50 MG 24 hr tablet Take 1 tablet (50 mg total) by mouth daily. Take with or immediately following a meal. Patient not taking: Reported on 09/20/2019 12/27/16   Jearld Fenton, NP  metoprolol tartrate (LOPRESSOR) 25 MG tablet Take 12.5 mg by mouth 2 (two) times daily.  07/29/19   [provider]  polyethylene glycol (MIRALAX / GLYCOLAX) 17 g packet Take 17 g by mouth daily as needed for mild constipation.    [provider]  traMADol (ULTRAM) 50 MG tablet Take 50 mg by mouth every 4 (four) hours as needed. 09/16/19   [provider]  TURMERIC PO Take 1 tablet by mouth every evening.    [provider]  vitamin B-12 (CYANOCOBALAMIN) 1000 MCG tablet Take 1,000 mcg by mouth daily.    [provider]    Allergies Amiodarone, Multaq [dronedarone], Oxycodone, and Albuterol    Social History Social History   Tobacco Use  . Smoking status: Never Smoker  . Smokeless tobacco: Never Used  Substance Use Topics  . Alcohol use: No    Comment: 1 martini/ day  . Drug use: No    Review of Systems Patient denies headaches, rhinorrhea, blurry vision, numbness, shortness of breath, chest pain, edema, cough, abdominal pain, nausea, vomiting, diarrhea, dysuria, fevers, rashes or hallucinations unless otherwise stated above in HPI. ____________________________________________   PHYSICAL EXAM:  VITAL SIGNS: Vitals:   10/13/19 1915 10/13/19 2300  BP: (!) 149/63 (!) 147/78  Pulse: (!) 57 (!) 53  Resp: 16 15  Temp: 97.8 F (36.6 C)   SpO2: 99% 100%    Constitutional: Alert and oriented.  Eyes: Conjunctivae are normal.  Pupils 60mm bilaterally  Head: Atraumatic. Nose: No congestion/rhinnorhea. Mouth/Throat: Mucous membranes are moist.   Neck: No stridor. Painless ROM.  Cardiovascular: Normal rate, regular rhythm. Grossly normal heart sounds.  Good peripheral circulation. Respiratory: Normal respiratory effort.  No retractions. Lungs CTAB. Gastrointestinal: Soft and nontender. No distention. No abdominal bruits. No CVA tenderness. Genitourinary:  Musculoskeletal: No lower extremity tenderness nor edema.  No joint effusions. Neurologic:  MAE spontaneously with good strength No gross focal neurologic deficits are appreciated. No facial  droop Skin:  Skin is warm, dry and intact. No rash noted. Psychiatric: Mood and affect are normal. Speech and behavior are normal.  ____________________________________________   LABS (all labs ordered are listed, but only abnormal results are displayed)  Results for orders placed or performed during the hospital encounter of 10/13/19 (from the past 24 hour(s))  Comprehensive metabolic panel     Status: Abnormal   Collection Time: 10/13/19  7:34 PM  Result Value Ref Range   Sodium 140 135 - 145 mmol/L   Potassium 4.0 3.5 - 5.1 mmol/L   Chloride 103 98 - 111 mmol/L   CO2 28 22 - 32 mmol/L   Glucose, Bld 137 (H) 70 - 99 mg/dL   BUN 25 (H) 8 - 23 mg/dL   Creatinine, Ser 1.00 0.61 - 1.24 mg/dL   Calcium 9.6 8.9 - 10.3 mg/dL   Total  Protein 7.2 6.5 - 8.1 g/dL   Albumin 4.3 3.5 - 5.0 g/dL   AST 19 15 - 41 U/L   ALT 11 0 - 44 U/L   Alkaline Phosphatase 61 38 - 126 U/L   Total Bilirubin 0.8 0.3 - 1.2 mg/dL   GFR calc non Af Amer >60 >60 mL/min   GFR calc Af Amer >60 >60 mL/min   Anion gap 9 5 - 15  CBC     Status: None   Collection Time: 10/13/19  7:34 PM  Result Value Ref Range   WBC 6.7 4.0 - 10.5 K/uL   RBC 4.29 4.22 - 5.81 MIL/uL   Hemoglobin 13.1 13.0 - 17.0 g/dL   HCT 41.1 39.0 - 52.0 %   MCV 95.8 80.0 - 100.0 fL   MCH 30.5 26.0 - 34.0 pg   MCHC 31.9 30.0 - 36.0 g/dL   RDW 14.6 11.5 - 15.5 %   Platelets 178 150 - 400 K/uL   nRBC 0.0 0.0 - 0.2 %  Urinalysis, Complete w Microscopic     Status: Abnormal   Collection Time: 10/13/19  7:49 PM  Result Value Ref Range   Color, Urine STRAW (A) YELLOW   APPearance CLEAR (A) CLEAR   Specific Gravity, Urine 1.006 1.005 - 1.030   pH 6.0 5.0 - 8.0   Glucose, UA NEGATIVE NEGATIVE mg/dL   Hgb urine dipstick NEGATIVE NEGATIVE   Bilirubin Urine NEGATIVE NEGATIVE   Ketones, ur NEGATIVE NEGATIVE mg/dL   Protein, ur NEGATIVE NEGATIVE mg/dL   Nitrite NEGATIVE NEGATIVE   Leukocytes,Ua NEGATIVE NEGATIVE   RBC / HPF 0-5 0 - 5 RBC/hpf    WBC, UA 0-5 0 - 5 WBC/hpf   Bacteria, UA NONE SEEN NONE SEEN   Squamous Epithelial / LPF 0-5 0 - 5   ____________________________________________  EKG My review and personal interpretation at Time: 22:55   Indication: confusion   Rate: 55  Rhythm: sinus Axis: left Other: rbbb, no stemi, nonspecific st abn ____________________________________________  RADIOLOGY  I personally reviewed all radiographic images ordered to evaluate for the above acute complaints and reviewed radiology reports and findings.  These findings were personally discussed with the patient.  Please see medical record for radiology report.  ____________________________________________   PROCEDURES  Procedure(s) performed:  Procedures    Critical Care performed: no ____________________________________________   INITIAL IMPRESSION / ASSESSMENT AND PLAN / ED COURSE  Pertinent labs & imaging results that were available during my care of the patient were reviewed by me and considered in my medical decision making (see chart for details).   DDX: Dehydration, sepsis, pna, uti, hypoglycemia, cva, drug effect, withdrawal, encephalitis   Neco Viverito is a 86 y.o. who presents to the ED with symptoms as described above.  Patient pleasant comfortable and nontoxic-appearing.  No sign of infectious process.  Does not seem consistent with CVA.  Do suspect this is polypharmacy.  Denies any chest pain or pressure.  Heart rate is borderline low he is on metoprolol.  When he wakes up and is talking his heart rate is in the high 50s and low 60s therefore I do not think his bradycardia causing the symptoms.  Discussed options for work-up with daughter at bedside.  Discussed option for observation the hospital hold medication and reassessment further medical management.  No family feels comfortable taking him back to memory care due to concern for worsening confusion and delirium with his dementia if he were to stay in the  hospital.  I think this is reasonable.  He is able to see his PCP who can further titrate his medications.  Have discussed with the patient and available family all diagnostics and treatments performed thus far and all questions were answered to the best of my ability. The patient demonstrates understanding and agreement with plan.      The patient was evaluated in Emergency Department today for the symptoms described in the history of present illness. He/she was evaluated in the context of the global COVID-19 pandemic, which necessitated consideration that the patient might be at risk for infection with the SARS-CoV-2 virus that causes COVID-19. Institutional protocols and algorithms that pertain to the evaluation of patients at risk for COVID-19 are in a state of rapid change based on information released by regulatory bodies including the CDC and federal and state organizations. These policies and algorithms were followed during the patient's care in the ED.  As part of my medical decision making, I reviewed the following data within the [place] notes reviewed and incorporated, Labs reviewed, notes from prior ED visits and [place] Controlled Substance Database   ____________________________________________   FINAL CLINICAL IMPRESSION(S) / ED DIAGNOSES  Final diagnoses:  Confusion      NEW MEDICATIONS STARTED DURING THIS VISIT:  New Prescriptions   No medications on file     Note:  This document was prepared using Dragon voice recognition software and may include unintentional dictation errors.    Merlyn Lot, MD 10/13/19 [phone]

## 2019-10-13 NOTE — ED Notes (Signed)
Daughter at bedside.

## 2019-10-13 NOTE — ED Triage Notes (Signed)
Pt to ED from [facility] after staff reports patient was more altered than normal. Pt has hx of dementia but staff report more confusion than normal with concerns for a stoke. Pt is alert upon arrival to triage, sitting up independently with appropriate speech.   After talking to patient and patients daughter, pt was less social than normal at dinner and had a hard time walking after dinner (pt can ambulate independently at baseline) Staff told daughter pt had slurred speech and a headache. Pt now stating the head pain has resolved but he feels "fatigued"   Pt denies NVD.

## 2019-10-13 NOTE — Discharge Instructions (Addendum)
Suspect the confusion and weakness is related to the pain medications (tramadol) as well as the valium.  Please try to reduce those medications as tolerable.  Discuss with your PCP.

## 2019-10-13 NOTE — ED Notes (Signed)
Pt from [facility] with his daughter. Pt's daughter states that patient sent due to concerns regarding AMS. Pt's daughter reports pt not as social as he normally is and patient fell asleep while at dinner. Pt's daughter reports recent medication changes and Namenda added to his medication list and Valium, pt's daughter reports Valium daily at 1700. Pt's daughter reports pt was drowsy at dinner, reports received call at 1800. Pt's daughter reports per staff pt with generalized weakness. Upon this RN and EDP arrival to bedside for eval, pt resting comfortably in recliner with NAD noted, respirations even and unlabored. Pt awakens easily with verbal stimuli. Pt reports feeling sleepy and "real confused". Facial symmetry intact, no drift noted to patient's arms, denies numbness at this time. Leg strength equal upon assessment. Pt denies abdominal pain at this time.

## 2019-10-13 NOTE — ED Notes (Signed)
Pt assisted to BR to void, qs; pt is ambulatory with shuffling gait noted, stand-by assist required only; large purplish bruising noted to outer rt thigh; pt denies pain with palpation; reported area to daughter

## 2019-10-14 DIAGNOSIS — R4182 Altered mental status, unspecified: Secondary | ICD-10-CM | POA: Diagnosis not present

## 2019-10-15 DIAGNOSIS — M25511 Pain in right shoulder: Secondary | ICD-10-CM | POA: Diagnosis not present

## 2019-10-15 DIAGNOSIS — M542 Cervicalgia: Secondary | ICD-10-CM | POA: Diagnosis not present

## 2019-10-15 DIAGNOSIS — M6281 Muscle weakness (generalized): Secondary | ICD-10-CM | POA: Diagnosis not present

## 2019-10-15 DIAGNOSIS — S46911D Strain of unspecified muscle, fascia and tendon at shoulder and upper arm level, right arm, subsequent encounter: Secondary | ICD-10-CM | POA: Diagnosis not present

## 2019-10-16 DIAGNOSIS — M19019 Primary osteoarthritis, unspecified shoulder: Secondary | ICD-10-CM | POA: Diagnosis not present

## 2019-10-16 DIAGNOSIS — S46911A Strain of unspecified muscle, fascia and tendon at shoulder and upper arm level, right arm, initial encounter: Secondary | ICD-10-CM | POA: Diagnosis not present

## 2019-10-20 DIAGNOSIS — S46911D Strain of unspecified muscle, fascia and tendon at shoulder and upper arm level, right arm, subsequent encounter: Secondary | ICD-10-CM | POA: Diagnosis not present

## 2019-10-20 DIAGNOSIS — M6281 Muscle weakness (generalized): Secondary | ICD-10-CM | POA: Diagnosis not present

## 2019-10-20 DIAGNOSIS — M542 Cervicalgia: Secondary | ICD-10-CM | POA: Diagnosis not present

## 2019-10-20 DIAGNOSIS — M25511 Pain in right shoulder: Secondary | ICD-10-CM | POA: Diagnosis not present

## 2019-10-22 DIAGNOSIS — M25511 Pain in right shoulder: Secondary | ICD-10-CM | POA: Diagnosis not present

## 2019-10-22 DIAGNOSIS — M6281 Muscle weakness (generalized): Secondary | ICD-10-CM | POA: Diagnosis not present

## 2019-10-22 DIAGNOSIS — S46911D Strain of unspecified muscle, fascia and tendon at shoulder and upper arm level, right arm, subsequent encounter: Secondary | ICD-10-CM | POA: Diagnosis not present

## 2019-10-22 DIAGNOSIS — M542 Cervicalgia: Secondary | ICD-10-CM | POA: Diagnosis not present

## 2019-10-27 DIAGNOSIS — S46911D Strain of unspecified muscle, fascia and tendon at shoulder and upper arm level, right arm, subsequent encounter: Secondary | ICD-10-CM | POA: Diagnosis not present

## 2019-10-27 DIAGNOSIS — M542 Cervicalgia: Secondary | ICD-10-CM | POA: Diagnosis not present

## 2019-10-27 DIAGNOSIS — M6281 Muscle weakness (generalized): Secondary | ICD-10-CM | POA: Diagnosis not present

## 2019-10-27 DIAGNOSIS — M25511 Pain in right shoulder: Secondary | ICD-10-CM | POA: Diagnosis not present

## 2019-10-29 DIAGNOSIS — M25511 Pain in right shoulder: Secondary | ICD-10-CM | POA: Diagnosis not present

## 2019-10-29 DIAGNOSIS — S46911D Strain of unspecified muscle, fascia and tendon at shoulder and upper arm level, right arm, subsequent encounter: Secondary | ICD-10-CM | POA: Diagnosis not present

## 2019-10-29 DIAGNOSIS — M542 Cervicalgia: Secondary | ICD-10-CM | POA: Diagnosis not present

## 2019-10-29 DIAGNOSIS — M6281 Muscle weakness (generalized): Secondary | ICD-10-CM | POA: Diagnosis not present

## 2019-11-03 DIAGNOSIS — M542 Cervicalgia: Secondary | ICD-10-CM | POA: Diagnosis not present

## 2019-11-03 DIAGNOSIS — M6281 Muscle weakness (generalized): Secondary | ICD-10-CM | POA: Diagnosis not present

## 2019-11-03 DIAGNOSIS — M25511 Pain in right shoulder: Secondary | ICD-10-CM | POA: Diagnosis not present

## 2019-11-03 DIAGNOSIS — S46911D Strain of unspecified muscle, fascia and tendon at shoulder and upper arm level, right arm, subsequent encounter: Secondary | ICD-10-CM | POA: Diagnosis not present

## 2019-11-05 DIAGNOSIS — M6281 Muscle weakness (generalized): Secondary | ICD-10-CM | POA: Diagnosis not present

## 2019-11-05 DIAGNOSIS — M25511 Pain in right shoulder: Secondary | ICD-10-CM | POA: Diagnosis not present

## 2019-11-05 DIAGNOSIS — S46911D Strain of unspecified muscle, fascia and tendon at shoulder and upper arm level, right arm, subsequent encounter: Secondary | ICD-10-CM | POA: Diagnosis not present

## 2019-11-05 DIAGNOSIS — M542 Cervicalgia: Secondary | ICD-10-CM | POA: Diagnosis not present

## 2019-11-06 DIAGNOSIS — I2510 Atherosclerotic heart disease of native coronary artery without angina pectoris: Secondary | ICD-10-CM | POA: Diagnosis not present

## 2019-11-06 DIAGNOSIS — M5412 Radiculopathy, cervical region: Secondary | ICD-10-CM | POA: Diagnosis not present

## 2019-11-06 DIAGNOSIS — G459 Transient cerebral ischemic attack, unspecified: Secondary | ICD-10-CM | POA: Diagnosis not present

## 2019-11-06 DIAGNOSIS — F39 Unspecified mood [affective] disorder: Secondary | ICD-10-CM | POA: Diagnosis not present

## 2019-11-06 DIAGNOSIS — G309 Alzheimer's disease, unspecified: Secondary | ICD-10-CM | POA: Diagnosis not present

## 2019-11-06 DIAGNOSIS — K219 Gastro-esophageal reflux disease without esophagitis: Secondary | ICD-10-CM | POA: Diagnosis not present

## 2019-11-06 DIAGNOSIS — I4891 Unspecified atrial fibrillation: Secondary | ICD-10-CM | POA: Diagnosis not present

## 2019-11-10 DIAGNOSIS — M6281 Muscle weakness (generalized): Secondary | ICD-10-CM | POA: Diagnosis not present

## 2019-11-10 DIAGNOSIS — M542 Cervicalgia: Secondary | ICD-10-CM | POA: Diagnosis not present

## 2019-11-10 DIAGNOSIS — M25511 Pain in right shoulder: Secondary | ICD-10-CM | POA: Diagnosis not present

## 2019-11-10 DIAGNOSIS — S46911D Strain of unspecified muscle, fascia and tendon at shoulder and upper arm level, right arm, subsequent encounter: Secondary | ICD-10-CM | POA: Diagnosis not present

## 2020-01-06 DIAGNOSIS — F39 Unspecified mood [affective] disorder: Secondary | ICD-10-CM | POA: Diagnosis not present

## 2020-01-06 DIAGNOSIS — G459 Transient cerebral ischemic attack, unspecified: Secondary | ICD-10-CM | POA: Diagnosis not present

## 2020-01-06 DIAGNOSIS — I2510 Atherosclerotic heart disease of native coronary artery without angina pectoris: Secondary | ICD-10-CM | POA: Diagnosis not present

## 2020-01-06 DIAGNOSIS — K219 Gastro-esophageal reflux disease without esophagitis: Secondary | ICD-10-CM | POA: Diagnosis not present

## 2020-01-06 DIAGNOSIS — G309 Alzheimer's disease, unspecified: Secondary | ICD-10-CM | POA: Diagnosis not present

## 2020-01-06 DIAGNOSIS — I4891 Unspecified atrial fibrillation: Secondary | ICD-10-CM | POA: Diagnosis not present

## 2020-01-06 DIAGNOSIS — M5412 Radiculopathy, cervical region: Secondary | ICD-10-CM | POA: Diagnosis not present

## 2020-02-27 DIAGNOSIS — M25432 Effusion, left wrist: Secondary | ICD-10-CM | POA: Diagnosis not present

## 2020-02-27 DIAGNOSIS — M25532 Pain in left wrist: Secondary | ICD-10-CM | POA: Diagnosis not present

## 2020-02-27 DIAGNOSIS — M79642 Pain in left hand: Secondary | ICD-10-CM | POA: Diagnosis not present

## 2020-02-27 DIAGNOSIS — M25442 Effusion, left hand: Secondary | ICD-10-CM | POA: Diagnosis not present

## 2020-03-01 DIAGNOSIS — M542 Cervicalgia: Secondary | ICD-10-CM | POA: Diagnosis not present

## 2020-03-01 DIAGNOSIS — R519 Headache, unspecified: Secondary | ICD-10-CM | POA: Diagnosis not present

## 2020-03-03 DIAGNOSIS — R2681 Unsteadiness on feet: Secondary | ICD-10-CM | POA: Diagnosis not present

## 2020-03-03 DIAGNOSIS — G309 Alzheimer's disease, unspecified: Secondary | ICD-10-CM | POA: Diagnosis not present

## 2020-03-03 DIAGNOSIS — R262 Difficulty in walking, not elsewhere classified: Secondary | ICD-10-CM | POA: Diagnosis not present

## 2020-03-14 DIAGNOSIS — G309 Alzheimer's disease, unspecified: Secondary | ICD-10-CM | POA: Diagnosis not present

## 2020-03-14 DIAGNOSIS — R262 Difficulty in walking, not elsewhere classified: Secondary | ICD-10-CM | POA: Diagnosis not present

## 2020-03-14 DIAGNOSIS — R2681 Unsteadiness on feet: Secondary | ICD-10-CM | POA: Diagnosis not present

## 2020-03-15 DIAGNOSIS — R2681 Unsteadiness on feet: Secondary | ICD-10-CM | POA: Diagnosis not present

## 2020-03-15 DIAGNOSIS — R262 Difficulty in walking, not elsewhere classified: Secondary | ICD-10-CM | POA: Diagnosis not present

## 2020-03-15 DIAGNOSIS — G309 Alzheimer's disease, unspecified: Secondary | ICD-10-CM | POA: Diagnosis not present

## 2020-03-16 DIAGNOSIS — G309 Alzheimer's disease, unspecified: Secondary | ICD-10-CM | POA: Diagnosis not present

## 2020-03-16 DIAGNOSIS — R2681 Unsteadiness on feet: Secondary | ICD-10-CM | POA: Diagnosis not present

## 2020-03-16 DIAGNOSIS — R262 Difficulty in walking, not elsewhere classified: Secondary | ICD-10-CM | POA: Diagnosis not present

## 2020-03-18 DIAGNOSIS — F39 Unspecified mood [affective] disorder: Secondary | ICD-10-CM | POA: Diagnosis not present

## 2020-03-18 DIAGNOSIS — R262 Difficulty in walking, not elsewhere classified: Secondary | ICD-10-CM | POA: Diagnosis not present

## 2020-03-18 DIAGNOSIS — I2510 Atherosclerotic heart disease of native coronary artery without angina pectoris: Secondary | ICD-10-CM | POA: Diagnosis not present

## 2020-03-18 DIAGNOSIS — K219 Gastro-esophageal reflux disease without esophagitis: Secondary | ICD-10-CM | POA: Diagnosis not present

## 2020-03-18 DIAGNOSIS — I4891 Unspecified atrial fibrillation: Secondary | ICD-10-CM | POA: Diagnosis not present

## 2020-03-18 DIAGNOSIS — R2681 Unsteadiness on feet: Secondary | ICD-10-CM | POA: Diagnosis not present

## 2020-03-18 DIAGNOSIS — G459 Transient cerebral ischemic attack, unspecified: Secondary | ICD-10-CM | POA: Diagnosis not present

## 2020-03-18 DIAGNOSIS — M5412 Radiculopathy, cervical region: Secondary | ICD-10-CM | POA: Diagnosis not present

## 2020-03-18 DIAGNOSIS — G309 Alzheimer's disease, unspecified: Secondary | ICD-10-CM | POA: Diagnosis not present

## 2020-03-22 DIAGNOSIS — R262 Difficulty in walking, not elsewhere classified: Secondary | ICD-10-CM | POA: Diagnosis not present

## 2020-03-22 DIAGNOSIS — R2681 Unsteadiness on feet: Secondary | ICD-10-CM | POA: Diagnosis not present

## 2020-03-22 DIAGNOSIS — G309 Alzheimer's disease, unspecified: Secondary | ICD-10-CM | POA: Diagnosis not present

## 2020-05-05 DIAGNOSIS — M5412 Radiculopathy, cervical region: Secondary | ICD-10-CM | POA: Diagnosis not present

## 2020-05-05 DIAGNOSIS — I480 Paroxysmal atrial fibrillation: Secondary | ICD-10-CM | POA: Diagnosis not present

## 2020-05-05 DIAGNOSIS — G301 Alzheimer's disease with late onset: Secondary | ICD-10-CM | POA: Diagnosis not present

## 2020-05-05 DIAGNOSIS — F39 Unspecified mood [affective] disorder: Secondary | ICD-10-CM | POA: Diagnosis not present

## 2020-05-05 DIAGNOSIS — Z79899 Other long term (current) drug therapy: Secondary | ICD-10-CM | POA: Diagnosis not present

## 2020-05-05 DIAGNOSIS — I2510 Atherosclerotic heart disease of native coronary artery without angina pectoris: Secondary | ICD-10-CM | POA: Diagnosis not present

## 2020-06-27 DIAGNOSIS — H938X2 Other specified disorders of left ear: Secondary | ICD-10-CM | POA: Diagnosis not present

## 2020-07-15 DIAGNOSIS — F39 Unspecified mood [affective] disorder: Secondary | ICD-10-CM | POA: Diagnosis not present

## 2020-07-15 DIAGNOSIS — I4891 Unspecified atrial fibrillation: Secondary | ICD-10-CM | POA: Diagnosis not present

## 2020-07-15 DIAGNOSIS — M5412 Radiculopathy, cervical region: Secondary | ICD-10-CM | POA: Diagnosis not present

## 2020-07-15 DIAGNOSIS — Z8673 Personal history of transient ischemic attack (TIA), and cerebral infarction without residual deficits: Secondary | ICD-10-CM | POA: Diagnosis not present

## 2020-07-15 DIAGNOSIS — I2510 Atherosclerotic heart disease of native coronary artery without angina pectoris: Secondary | ICD-10-CM | POA: Diagnosis not present

## 2020-07-15 DIAGNOSIS — G309 Alzheimer's disease, unspecified: Secondary | ICD-10-CM | POA: Diagnosis not present

## 2020-08-08 DIAGNOSIS — L570 Actinic keratosis: Secondary | ICD-10-CM | POA: Diagnosis not present

## 2020-09-01 DIAGNOSIS — I2510 Atherosclerotic heart disease of native coronary artery without angina pectoris: Secondary | ICD-10-CM | POA: Diagnosis not present

## 2020-09-01 DIAGNOSIS — G301 Alzheimer's disease with late onset: Secondary | ICD-10-CM | POA: Diagnosis not present

## 2020-09-01 DIAGNOSIS — I480 Paroxysmal atrial fibrillation: Secondary | ICD-10-CM | POA: Diagnosis not present

## 2020-09-01 DIAGNOSIS — M5412 Radiculopathy, cervical region: Secondary | ICD-10-CM | POA: Diagnosis not present

## 2020-09-01 DIAGNOSIS — F39 Unspecified mood [affective] disorder: Secondary | ICD-10-CM | POA: Diagnosis not present

## 2020-11-11 DIAGNOSIS — I2510 Atherosclerotic heart disease of native coronary artery without angina pectoris: Secondary | ICD-10-CM | POA: Diagnosis not present

## 2020-11-11 DIAGNOSIS — M5412 Radiculopathy, cervical region: Secondary | ICD-10-CM | POA: Diagnosis not present

## 2020-11-11 DIAGNOSIS — G309 Alzheimer's disease, unspecified: Secondary | ICD-10-CM | POA: Diagnosis not present

## 2020-11-11 DIAGNOSIS — I4891 Unspecified atrial fibrillation: Secondary | ICD-10-CM | POA: Diagnosis not present

## 2020-11-11 DIAGNOSIS — Z8673 Personal history of transient ischemic attack (TIA), and cerebral infarction without residual deficits: Secondary | ICD-10-CM | POA: Diagnosis not present

## 2020-11-11 DIAGNOSIS — K219 Gastro-esophageal reflux disease without esophagitis: Secondary | ICD-10-CM | POA: Diagnosis not present

## 2020-11-11 DIAGNOSIS — F39 Unspecified mood [affective] disorder: Secondary | ICD-10-CM | POA: Diagnosis not present

## 2021-03-15 DIAGNOSIS — K219 Gastro-esophageal reflux disease without esophagitis: Secondary | ICD-10-CM | POA: Diagnosis not present

## 2021-03-15 DIAGNOSIS — F39 Unspecified mood [affective] disorder: Secondary | ICD-10-CM | POA: Diagnosis not present

## 2021-03-15 DIAGNOSIS — I4891 Unspecified atrial fibrillation: Secondary | ICD-10-CM | POA: Diagnosis not present

## 2021-03-15 DIAGNOSIS — G309 Alzheimer's disease, unspecified: Secondary | ICD-10-CM | POA: Diagnosis not present

## 2021-03-15 DIAGNOSIS — I2510 Atherosclerotic heart disease of native coronary artery without angina pectoris: Secondary | ICD-10-CM | POA: Diagnosis not present

## 2021-03-15 DIAGNOSIS — Z8673 Personal history of transient ischemic attack (TIA), and cerebral infarction without residual deficits: Secondary | ICD-10-CM | POA: Diagnosis not present

## 2021-03-15 DIAGNOSIS — M5412 Radiculopathy, cervical region: Secondary | ICD-10-CM | POA: Diagnosis not present

## 2021-05-04 DIAGNOSIS — D649 Anemia, unspecified: Secondary | ICD-10-CM | POA: Diagnosis not present

## 2021-05-04 LAB — CBC AND DIFFERENTIAL
HCT: 42 (ref 41–53)
HCT: 42 (ref 41–53)
Hemoglobin: 14.4 (ref 13.5–17.5)
Hemoglobin: 14.4 (ref 13.5–17.5)
Neutrophils Absolute: 2896
Neutrophils Absolute: 2896
Platelets: 143 10*3/uL — AB (ref 150–400)
Platelets: 143 10*3/uL — AB (ref 150–400)
WBC: 5.7
WBC: 5.7

## 2021-05-04 LAB — BASIC METABOLIC PANEL
BUN: 22 — AB (ref 4–21)
BUN: 22 — AB (ref 4–21)
CO2: 28 — AB (ref 13–22)
CO2: 28 — AB (ref 13–22)
Chloride: 105 (ref 99–108)
Chloride: 105 (ref 99–108)
Creatinine: 0.9 (ref 0.6–1.3)
Creatinine: 0.9 (ref 0.6–1.3)
Glucose: 81
Glucose: 81
Potassium: 3.9 mEq/L (ref 3.5–5.1)
Potassium: 3.9 mEq/L (ref 3.5–5.1)
Sodium: 140 (ref 137–147)
Sodium: 140 (ref 137–147)

## 2021-05-04 LAB — COMPREHENSIVE METABOLIC PANEL
Calcium: 9.6 (ref 8.7–10.7)
Calcium: 9.6 (ref 8.7–10.7)
eGFR: 84

## 2021-05-04 LAB — CBC
RBC: 4.64 (ref 3.87–5.11)
RBC: 4.64 (ref 3.87–5.11)

## 2021-05-11 DIAGNOSIS — G301 Alzheimer's disease with late onset: Secondary | ICD-10-CM | POA: Diagnosis not present

## 2021-05-11 DIAGNOSIS — I4819 Other persistent atrial fibrillation: Secondary | ICD-10-CM | POA: Diagnosis not present

## 2021-05-11 DIAGNOSIS — F39 Unspecified mood [affective] disorder: Secondary | ICD-10-CM | POA: Diagnosis not present

## 2021-05-11 DIAGNOSIS — I2510 Atherosclerotic heart disease of native coronary artery without angina pectoris: Secondary | ICD-10-CM | POA: Diagnosis not present

## 2021-07-07 DIAGNOSIS — K219 Gastro-esophageal reflux disease without esophagitis: Secondary | ICD-10-CM

## 2021-07-07 DIAGNOSIS — F39 Unspecified mood [affective] disorder: Secondary | ICD-10-CM | POA: Diagnosis not present

## 2021-07-07 DIAGNOSIS — Z8673 Personal history of transient ischemic attack (TIA), and cerebral infarction without residual deficits: Secondary | ICD-10-CM

## 2021-07-07 DIAGNOSIS — M5412 Radiculopathy, cervical region: Secondary | ICD-10-CM

## 2021-07-07 DIAGNOSIS — G309 Alzheimer's disease, unspecified: Secondary | ICD-10-CM | POA: Diagnosis not present

## 2021-07-07 DIAGNOSIS — I4891 Unspecified atrial fibrillation: Secondary | ICD-10-CM | POA: Diagnosis not present

## 2021-07-07 DIAGNOSIS — I2510 Atherosclerotic heart disease of native coronary artery without angina pectoris: Secondary | ICD-10-CM | POA: Diagnosis not present

## 2021-08-30 DIAGNOSIS — R2681 Unsteadiness on feet: Secondary | ICD-10-CM | POA: Diagnosis not present

## 2021-08-30 DIAGNOSIS — R278 Other lack of coordination: Secondary | ICD-10-CM | POA: Diagnosis not present

## 2021-08-30 DIAGNOSIS — G309 Alzheimer's disease, unspecified: Secondary | ICD-10-CM | POA: Diagnosis not present

## 2021-08-30 DIAGNOSIS — F0280 Dementia in other diseases classified elsewhere without behavioral disturbance: Secondary | ICD-10-CM | POA: Diagnosis not present

## 2021-08-30 DIAGNOSIS — I4891 Unspecified atrial fibrillation: Secondary | ICD-10-CM | POA: Diagnosis not present

## 2021-08-30 DIAGNOSIS — M1990 Unspecified osteoarthritis, unspecified site: Secondary | ICD-10-CM | POA: Diagnosis not present

## 2021-08-30 DIAGNOSIS — M6281 Muscle weakness (generalized): Secondary | ICD-10-CM | POA: Diagnosis not present

## 2021-09-04 DIAGNOSIS — M1990 Unspecified osteoarthritis, unspecified site: Secondary | ICD-10-CM | POA: Diagnosis not present

## 2021-09-04 DIAGNOSIS — R278 Other lack of coordination: Secondary | ICD-10-CM | POA: Diagnosis not present

## 2021-09-04 DIAGNOSIS — R2681 Unsteadiness on feet: Secondary | ICD-10-CM | POA: Diagnosis not present

## 2021-09-04 DIAGNOSIS — I4891 Unspecified atrial fibrillation: Secondary | ICD-10-CM | POA: Diagnosis not present

## 2021-09-04 DIAGNOSIS — M6281 Muscle weakness (generalized): Secondary | ICD-10-CM | POA: Diagnosis not present

## 2021-09-04 DIAGNOSIS — G309 Alzheimer's disease, unspecified: Secondary | ICD-10-CM | POA: Diagnosis not present

## 2021-09-04 DIAGNOSIS — F0280 Dementia in other diseases classified elsewhere without behavioral disturbance: Secondary | ICD-10-CM | POA: Diagnosis not present

## 2021-09-05 DIAGNOSIS — G309 Alzheimer's disease, unspecified: Secondary | ICD-10-CM | POA: Diagnosis not present

## 2021-09-05 DIAGNOSIS — R278 Other lack of coordination: Secondary | ICD-10-CM | POA: Diagnosis not present

## 2021-09-05 DIAGNOSIS — F0280 Dementia in other diseases classified elsewhere without behavioral disturbance: Secondary | ICD-10-CM | POA: Diagnosis not present

## 2021-09-05 DIAGNOSIS — M6281 Muscle weakness (generalized): Secondary | ICD-10-CM | POA: Diagnosis not present

## 2021-09-05 DIAGNOSIS — R2681 Unsteadiness on feet: Secondary | ICD-10-CM | POA: Diagnosis not present

## 2021-09-05 DIAGNOSIS — M1990 Unspecified osteoarthritis, unspecified site: Secondary | ICD-10-CM | POA: Diagnosis not present

## 2021-09-05 DIAGNOSIS — I4891 Unspecified atrial fibrillation: Secondary | ICD-10-CM | POA: Diagnosis not present

## 2021-09-06 DIAGNOSIS — R278 Other lack of coordination: Secondary | ICD-10-CM | POA: Diagnosis not present

## 2021-09-06 DIAGNOSIS — I4891 Unspecified atrial fibrillation: Secondary | ICD-10-CM | POA: Diagnosis not present

## 2021-09-06 DIAGNOSIS — M6281 Muscle weakness (generalized): Secondary | ICD-10-CM | POA: Diagnosis not present

## 2021-09-06 DIAGNOSIS — R2681 Unsteadiness on feet: Secondary | ICD-10-CM | POA: Diagnosis not present

## 2021-09-06 DIAGNOSIS — G309 Alzheimer's disease, unspecified: Secondary | ICD-10-CM | POA: Diagnosis not present

## 2021-09-06 DIAGNOSIS — F0280 Dementia in other diseases classified elsewhere without behavioral disturbance: Secondary | ICD-10-CM | POA: Diagnosis not present

## 2021-09-06 DIAGNOSIS — M1990 Unspecified osteoarthritis, unspecified site: Secondary | ICD-10-CM | POA: Diagnosis not present

## 2021-09-11 DIAGNOSIS — I4891 Unspecified atrial fibrillation: Secondary | ICD-10-CM | POA: Diagnosis not present

## 2021-09-11 DIAGNOSIS — M1990 Unspecified osteoarthritis, unspecified site: Secondary | ICD-10-CM | POA: Diagnosis not present

## 2021-09-11 DIAGNOSIS — R2681 Unsteadiness on feet: Secondary | ICD-10-CM | POA: Diagnosis not present

## 2021-09-11 DIAGNOSIS — M6281 Muscle weakness (generalized): Secondary | ICD-10-CM | POA: Diagnosis not present

## 2021-09-11 DIAGNOSIS — R278 Other lack of coordination: Secondary | ICD-10-CM | POA: Diagnosis not present

## 2021-09-11 DIAGNOSIS — F0280 Dementia in other diseases classified elsewhere without behavioral disturbance: Secondary | ICD-10-CM | POA: Diagnosis not present

## 2021-09-11 DIAGNOSIS — G309 Alzheimer's disease, unspecified: Secondary | ICD-10-CM | POA: Diagnosis not present

## 2021-09-12 DIAGNOSIS — G309 Alzheimer's disease, unspecified: Secondary | ICD-10-CM | POA: Diagnosis not present

## 2021-09-12 DIAGNOSIS — M6281 Muscle weakness (generalized): Secondary | ICD-10-CM | POA: Diagnosis not present

## 2021-09-12 DIAGNOSIS — I4891 Unspecified atrial fibrillation: Secondary | ICD-10-CM | POA: Diagnosis not present

## 2021-09-12 DIAGNOSIS — R278 Other lack of coordination: Secondary | ICD-10-CM | POA: Diagnosis not present

## 2021-09-12 DIAGNOSIS — R2681 Unsteadiness on feet: Secondary | ICD-10-CM | POA: Diagnosis not present

## 2021-09-12 DIAGNOSIS — M1990 Unspecified osteoarthritis, unspecified site: Secondary | ICD-10-CM | POA: Diagnosis not present

## 2021-09-12 DIAGNOSIS — F0280 Dementia in other diseases classified elsewhere without behavioral disturbance: Secondary | ICD-10-CM | POA: Diagnosis not present

## 2021-09-13 DIAGNOSIS — M6281 Muscle weakness (generalized): Secondary | ICD-10-CM | POA: Diagnosis not present

## 2021-09-13 DIAGNOSIS — I4891 Unspecified atrial fibrillation: Secondary | ICD-10-CM | POA: Diagnosis not present

## 2021-09-13 DIAGNOSIS — F0280 Dementia in other diseases classified elsewhere without behavioral disturbance: Secondary | ICD-10-CM | POA: Diagnosis not present

## 2021-09-13 DIAGNOSIS — M1990 Unspecified osteoarthritis, unspecified site: Secondary | ICD-10-CM | POA: Diagnosis not present

## 2021-09-13 DIAGNOSIS — R278 Other lack of coordination: Secondary | ICD-10-CM | POA: Diagnosis not present

## 2021-09-13 DIAGNOSIS — G309 Alzheimer's disease, unspecified: Secondary | ICD-10-CM | POA: Diagnosis not present

## 2021-09-13 DIAGNOSIS — R2681 Unsteadiness on feet: Secondary | ICD-10-CM | POA: Diagnosis not present

## 2021-09-15 DIAGNOSIS — R2681 Unsteadiness on feet: Secondary | ICD-10-CM | POA: Diagnosis not present

## 2021-09-15 DIAGNOSIS — M6281 Muscle weakness (generalized): Secondary | ICD-10-CM | POA: Diagnosis not present

## 2021-09-15 DIAGNOSIS — I4891 Unspecified atrial fibrillation: Secondary | ICD-10-CM | POA: Diagnosis not present

## 2021-09-15 DIAGNOSIS — R278 Other lack of coordination: Secondary | ICD-10-CM | POA: Diagnosis not present

## 2021-09-15 DIAGNOSIS — M1990 Unspecified osteoarthritis, unspecified site: Secondary | ICD-10-CM | POA: Diagnosis not present

## 2021-09-15 DIAGNOSIS — G309 Alzheimer's disease, unspecified: Secondary | ICD-10-CM | POA: Diagnosis not present

## 2021-09-15 DIAGNOSIS — F0280 Dementia in other diseases classified elsewhere without behavioral disturbance: Secondary | ICD-10-CM | POA: Diagnosis not present

## 2021-09-18 DIAGNOSIS — G309 Alzheimer's disease, unspecified: Secondary | ICD-10-CM | POA: Diagnosis not present

## 2021-09-18 DIAGNOSIS — G301 Alzheimer's disease with late onset: Secondary | ICD-10-CM | POA: Diagnosis not present

## 2021-09-18 DIAGNOSIS — F39 Unspecified mood [affective] disorder: Secondary | ICD-10-CM | POA: Diagnosis not present

## 2021-09-18 DIAGNOSIS — F0280 Dementia in other diseases classified elsewhere without behavioral disturbance: Secondary | ICD-10-CM | POA: Diagnosis not present

## 2021-09-18 DIAGNOSIS — I4891 Unspecified atrial fibrillation: Secondary | ICD-10-CM | POA: Diagnosis not present

## 2021-09-18 DIAGNOSIS — M6281 Muscle weakness (generalized): Secondary | ICD-10-CM | POA: Diagnosis not present

## 2021-09-18 DIAGNOSIS — I2510 Atherosclerotic heart disease of native coronary artery without angina pectoris: Secondary | ICD-10-CM | POA: Diagnosis not present

## 2021-09-18 DIAGNOSIS — R278 Other lack of coordination: Secondary | ICD-10-CM | POA: Diagnosis not present

## 2021-09-18 DIAGNOSIS — M5412 Radiculopathy, cervical region: Secondary | ICD-10-CM | POA: Diagnosis not present

## 2021-09-18 DIAGNOSIS — I480 Paroxysmal atrial fibrillation: Secondary | ICD-10-CM | POA: Diagnosis not present

## 2021-09-18 DIAGNOSIS — R2681 Unsteadiness on feet: Secondary | ICD-10-CM | POA: Diagnosis not present

## 2021-09-18 DIAGNOSIS — M1990 Unspecified osteoarthritis, unspecified site: Secondary | ICD-10-CM | POA: Diagnosis not present

## 2021-09-20 DIAGNOSIS — M1990 Unspecified osteoarthritis, unspecified site: Secondary | ICD-10-CM | POA: Diagnosis not present

## 2021-09-20 DIAGNOSIS — F0280 Dementia in other diseases classified elsewhere without behavioral disturbance: Secondary | ICD-10-CM | POA: Diagnosis not present

## 2021-09-20 DIAGNOSIS — R2681 Unsteadiness on feet: Secondary | ICD-10-CM | POA: Diagnosis not present

## 2021-09-20 DIAGNOSIS — I4891 Unspecified atrial fibrillation: Secondary | ICD-10-CM | POA: Diagnosis not present

## 2021-09-20 DIAGNOSIS — M6281 Muscle weakness (generalized): Secondary | ICD-10-CM | POA: Diagnosis not present

## 2021-09-20 DIAGNOSIS — G309 Alzheimer's disease, unspecified: Secondary | ICD-10-CM | POA: Diagnosis not present

## 2021-09-20 DIAGNOSIS — R278 Other lack of coordination: Secondary | ICD-10-CM | POA: Diagnosis not present

## 2021-09-22 DIAGNOSIS — F0280 Dementia in other diseases classified elsewhere without behavioral disturbance: Secondary | ICD-10-CM | POA: Diagnosis not present

## 2021-09-22 DIAGNOSIS — G309 Alzheimer's disease, unspecified: Secondary | ICD-10-CM | POA: Diagnosis not present

## 2021-09-22 DIAGNOSIS — R278 Other lack of coordination: Secondary | ICD-10-CM | POA: Diagnosis not present

## 2021-09-22 DIAGNOSIS — M7662 Achilles tendinitis, left leg: Secondary | ICD-10-CM | POA: Diagnosis not present

## 2021-09-22 DIAGNOSIS — I4891 Unspecified atrial fibrillation: Secondary | ICD-10-CM | POA: Diagnosis not present

## 2021-09-22 DIAGNOSIS — R2681 Unsteadiness on feet: Secondary | ICD-10-CM | POA: Diagnosis not present

## 2021-09-22 DIAGNOSIS — M1990 Unspecified osteoarthritis, unspecified site: Secondary | ICD-10-CM | POA: Diagnosis not present

## 2021-09-22 DIAGNOSIS — M6281 Muscle weakness (generalized): Secondary | ICD-10-CM | POA: Diagnosis not present

## 2021-09-26 DIAGNOSIS — G309 Alzheimer's disease, unspecified: Secondary | ICD-10-CM | POA: Diagnosis not present

## 2021-09-26 DIAGNOSIS — R2681 Unsteadiness on feet: Secondary | ICD-10-CM | POA: Diagnosis not present

## 2021-09-26 DIAGNOSIS — M6281 Muscle weakness (generalized): Secondary | ICD-10-CM | POA: Diagnosis not present

## 2021-09-26 DIAGNOSIS — R278 Other lack of coordination: Secondary | ICD-10-CM | POA: Diagnosis not present

## 2021-09-26 DIAGNOSIS — F0280 Dementia in other diseases classified elsewhere without behavioral disturbance: Secondary | ICD-10-CM | POA: Diagnosis not present

## 2021-09-26 DIAGNOSIS — I4891 Unspecified atrial fibrillation: Secondary | ICD-10-CM | POA: Diagnosis not present

## 2021-09-26 DIAGNOSIS — M1990 Unspecified osteoarthritis, unspecified site: Secondary | ICD-10-CM | POA: Diagnosis not present

## 2021-09-27 DIAGNOSIS — R278 Other lack of coordination: Secondary | ICD-10-CM | POA: Diagnosis not present

## 2021-09-27 DIAGNOSIS — G309 Alzheimer's disease, unspecified: Secondary | ICD-10-CM | POA: Diagnosis not present

## 2021-09-27 DIAGNOSIS — R2681 Unsteadiness on feet: Secondary | ICD-10-CM | POA: Diagnosis not present

## 2021-09-27 DIAGNOSIS — I4891 Unspecified atrial fibrillation: Secondary | ICD-10-CM | POA: Diagnosis not present

## 2021-09-27 DIAGNOSIS — M1990 Unspecified osteoarthritis, unspecified site: Secondary | ICD-10-CM | POA: Diagnosis not present

## 2021-09-27 DIAGNOSIS — M6281 Muscle weakness (generalized): Secondary | ICD-10-CM | POA: Diagnosis not present

## 2021-09-27 DIAGNOSIS — F0280 Dementia in other diseases classified elsewhere without behavioral disturbance: Secondary | ICD-10-CM | POA: Diagnosis not present

## 2021-09-29 DIAGNOSIS — F0280 Dementia in other diseases classified elsewhere without behavioral disturbance: Secondary | ICD-10-CM | POA: Diagnosis not present

## 2021-09-29 DIAGNOSIS — M6281 Muscle weakness (generalized): Secondary | ICD-10-CM | POA: Diagnosis not present

## 2021-09-29 DIAGNOSIS — R2681 Unsteadiness on feet: Secondary | ICD-10-CM | POA: Diagnosis not present

## 2021-09-29 DIAGNOSIS — G309 Alzheimer's disease, unspecified: Secondary | ICD-10-CM | POA: Diagnosis not present

## 2021-09-29 DIAGNOSIS — M1990 Unspecified osteoarthritis, unspecified site: Secondary | ICD-10-CM | POA: Diagnosis not present

## 2021-09-29 DIAGNOSIS — I4891 Unspecified atrial fibrillation: Secondary | ICD-10-CM | POA: Diagnosis not present

## 2021-09-29 DIAGNOSIS — R278 Other lack of coordination: Secondary | ICD-10-CM | POA: Diagnosis not present

## 2021-10-02 DIAGNOSIS — G309 Alzheimer's disease, unspecified: Secondary | ICD-10-CM | POA: Diagnosis not present

## 2021-10-02 DIAGNOSIS — I4891 Unspecified atrial fibrillation: Secondary | ICD-10-CM | POA: Diagnosis not present

## 2021-10-02 DIAGNOSIS — R2681 Unsteadiness on feet: Secondary | ICD-10-CM | POA: Diagnosis not present

## 2021-10-02 DIAGNOSIS — F0280 Dementia in other diseases classified elsewhere without behavioral disturbance: Secondary | ICD-10-CM | POA: Diagnosis not present

## 2021-10-02 DIAGNOSIS — M6281 Muscle weakness (generalized): Secondary | ICD-10-CM | POA: Diagnosis not present

## 2021-10-02 DIAGNOSIS — M1990 Unspecified osteoarthritis, unspecified site: Secondary | ICD-10-CM | POA: Diagnosis not present

## 2021-10-02 DIAGNOSIS — R278 Other lack of coordination: Secondary | ICD-10-CM | POA: Diagnosis not present

## 2021-10-04 DIAGNOSIS — F0280 Dementia in other diseases classified elsewhere without behavioral disturbance: Secondary | ICD-10-CM | POA: Diagnosis not present

## 2021-10-04 DIAGNOSIS — R2681 Unsteadiness on feet: Secondary | ICD-10-CM | POA: Diagnosis not present

## 2021-10-04 DIAGNOSIS — R278 Other lack of coordination: Secondary | ICD-10-CM | POA: Diagnosis not present

## 2021-10-04 DIAGNOSIS — M6281 Muscle weakness (generalized): Secondary | ICD-10-CM | POA: Diagnosis not present

## 2021-10-04 DIAGNOSIS — G309 Alzheimer's disease, unspecified: Secondary | ICD-10-CM | POA: Diagnosis not present

## 2021-10-04 DIAGNOSIS — I4891 Unspecified atrial fibrillation: Secondary | ICD-10-CM | POA: Diagnosis not present

## 2021-10-04 DIAGNOSIS — M1990 Unspecified osteoarthritis, unspecified site: Secondary | ICD-10-CM | POA: Diagnosis not present

## 2021-10-06 DIAGNOSIS — I4891 Unspecified atrial fibrillation: Secondary | ICD-10-CM | POA: Diagnosis not present

## 2021-10-06 DIAGNOSIS — M6281 Muscle weakness (generalized): Secondary | ICD-10-CM | POA: Diagnosis not present

## 2021-10-06 DIAGNOSIS — M1990 Unspecified osteoarthritis, unspecified site: Secondary | ICD-10-CM | POA: Diagnosis not present

## 2021-10-06 DIAGNOSIS — F0280 Dementia in other diseases classified elsewhere without behavioral disturbance: Secondary | ICD-10-CM | POA: Diagnosis not present

## 2021-10-06 DIAGNOSIS — G309 Alzheimer's disease, unspecified: Secondary | ICD-10-CM | POA: Diagnosis not present

## 2021-10-06 DIAGNOSIS — R2681 Unsteadiness on feet: Secondary | ICD-10-CM | POA: Diagnosis not present

## 2021-10-06 DIAGNOSIS — R278 Other lack of coordination: Secondary | ICD-10-CM | POA: Diagnosis not present

## 2021-10-13 DIAGNOSIS — G309 Alzheimer's disease, unspecified: Secondary | ICD-10-CM | POA: Diagnosis not present

## 2021-10-13 DIAGNOSIS — I4891 Unspecified atrial fibrillation: Secondary | ICD-10-CM | POA: Diagnosis not present

## 2021-10-13 DIAGNOSIS — M6281 Muscle weakness (generalized): Secondary | ICD-10-CM | POA: Diagnosis not present

## 2021-10-13 DIAGNOSIS — F0280 Dementia in other diseases classified elsewhere without behavioral disturbance: Secondary | ICD-10-CM | POA: Diagnosis not present

## 2021-10-13 DIAGNOSIS — R278 Other lack of coordination: Secondary | ICD-10-CM | POA: Diagnosis not present

## 2021-10-13 DIAGNOSIS — R2681 Unsteadiness on feet: Secondary | ICD-10-CM | POA: Diagnosis not present

## 2021-10-13 DIAGNOSIS — M1990 Unspecified osteoarthritis, unspecified site: Secondary | ICD-10-CM | POA: Diagnosis not present

## 2021-10-16 DIAGNOSIS — M1990 Unspecified osteoarthritis, unspecified site: Secondary | ICD-10-CM | POA: Diagnosis not present

## 2021-10-16 DIAGNOSIS — F0280 Dementia in other diseases classified elsewhere without behavioral disturbance: Secondary | ICD-10-CM | POA: Diagnosis not present

## 2021-10-16 DIAGNOSIS — G309 Alzheimer's disease, unspecified: Secondary | ICD-10-CM | POA: Diagnosis not present

## 2021-10-16 DIAGNOSIS — M6281 Muscle weakness (generalized): Secondary | ICD-10-CM | POA: Diagnosis not present

## 2021-10-16 DIAGNOSIS — R278 Other lack of coordination: Secondary | ICD-10-CM | POA: Diagnosis not present

## 2021-10-16 DIAGNOSIS — R2681 Unsteadiness on feet: Secondary | ICD-10-CM | POA: Diagnosis not present

## 2021-10-16 DIAGNOSIS — I4891 Unspecified atrial fibrillation: Secondary | ICD-10-CM | POA: Diagnosis not present

## 2021-10-19 DIAGNOSIS — F0280 Dementia in other diseases classified elsewhere without behavioral disturbance: Secondary | ICD-10-CM | POA: Diagnosis not present

## 2021-10-19 DIAGNOSIS — M1990 Unspecified osteoarthritis, unspecified site: Secondary | ICD-10-CM | POA: Diagnosis not present

## 2021-10-19 DIAGNOSIS — R2681 Unsteadiness on feet: Secondary | ICD-10-CM | POA: Diagnosis not present

## 2021-10-19 DIAGNOSIS — M6281 Muscle weakness (generalized): Secondary | ICD-10-CM | POA: Diagnosis not present

## 2021-10-19 DIAGNOSIS — R278 Other lack of coordination: Secondary | ICD-10-CM | POA: Diagnosis not present

## 2021-10-19 DIAGNOSIS — I4891 Unspecified atrial fibrillation: Secondary | ICD-10-CM | POA: Diagnosis not present

## 2021-10-19 DIAGNOSIS — G309 Alzheimer's disease, unspecified: Secondary | ICD-10-CM | POA: Diagnosis not present

## 2021-10-20 DIAGNOSIS — I4891 Unspecified atrial fibrillation: Secondary | ICD-10-CM | POA: Diagnosis not present

## 2021-10-20 DIAGNOSIS — M1990 Unspecified osteoarthritis, unspecified site: Secondary | ICD-10-CM | POA: Diagnosis not present

## 2021-10-20 DIAGNOSIS — M6281 Muscle weakness (generalized): Secondary | ICD-10-CM | POA: Diagnosis not present

## 2021-10-20 DIAGNOSIS — R278 Other lack of coordination: Secondary | ICD-10-CM | POA: Diagnosis not present

## 2021-10-20 DIAGNOSIS — R2681 Unsteadiness on feet: Secondary | ICD-10-CM | POA: Diagnosis not present

## 2021-10-20 DIAGNOSIS — G309 Alzheimer's disease, unspecified: Secondary | ICD-10-CM | POA: Diagnosis not present

## 2021-10-20 DIAGNOSIS — F0280 Dementia in other diseases classified elsewhere without behavioral disturbance: Secondary | ICD-10-CM | POA: Diagnosis not present

## 2021-11-01 DIAGNOSIS — I4891 Unspecified atrial fibrillation: Secondary | ICD-10-CM | POA: Diagnosis not present

## 2021-11-01 DIAGNOSIS — F39 Unspecified mood [affective] disorder: Secondary | ICD-10-CM | POA: Diagnosis not present

## 2021-11-01 DIAGNOSIS — K219 Gastro-esophageal reflux disease without esophagitis: Secondary | ICD-10-CM

## 2021-11-01 DIAGNOSIS — I2510 Atherosclerotic heart disease of native coronary artery without angina pectoris: Secondary | ICD-10-CM | POA: Diagnosis not present

## 2021-11-01 DIAGNOSIS — M5412 Radiculopathy, cervical region: Secondary | ICD-10-CM

## 2021-11-01 DIAGNOSIS — G309 Alzheimer's disease, unspecified: Secondary | ICD-10-CM | POA: Diagnosis not present

## 2021-11-01 DIAGNOSIS — Z8673 Personal history of transient ischemic attack (TIA), and cerebral infarction without residual deficits: Secondary | ICD-10-CM

## 2022-01-01 DIAGNOSIS — Z8673 Personal history of transient ischemic attack (TIA), and cerebral infarction without residual deficits: Secondary | ICD-10-CM | POA: Diagnosis not present

## 2022-01-01 DIAGNOSIS — G301 Alzheimer's disease with late onset: Secondary | ICD-10-CM | POA: Diagnosis not present

## 2022-01-01 DIAGNOSIS — M5412 Radiculopathy, cervical region: Secondary | ICD-10-CM | POA: Diagnosis not present

## 2022-01-01 DIAGNOSIS — F39 Unspecified mood [affective] disorder: Secondary | ICD-10-CM | POA: Diagnosis not present

## 2022-01-01 DIAGNOSIS — I2510 Atherosclerotic heart disease of native coronary artery without angina pectoris: Secondary | ICD-10-CM | POA: Diagnosis not present

## 2022-01-01 DIAGNOSIS — I480 Paroxysmal atrial fibrillation: Secondary | ICD-10-CM | POA: Diagnosis not present

## 2022-02-01 DIAGNOSIS — R278 Other lack of coordination: Secondary | ICD-10-CM | POA: Diagnosis not present

## 2022-02-01 DIAGNOSIS — R2689 Other abnormalities of gait and mobility: Secondary | ICD-10-CM | POA: Diagnosis not present

## 2022-02-01 DIAGNOSIS — G309 Alzheimer's disease, unspecified: Secondary | ICD-10-CM | POA: Diagnosis not present

## 2022-02-01 DIAGNOSIS — I4891 Unspecified atrial fibrillation: Secondary | ICD-10-CM | POA: Diagnosis not present

## 2022-02-01 DIAGNOSIS — M6281 Muscle weakness (generalized): Secondary | ICD-10-CM | POA: Diagnosis not present

## 2022-02-01 DIAGNOSIS — R2681 Unsteadiness on feet: Secondary | ICD-10-CM | POA: Diagnosis not present

## 2022-02-01 DIAGNOSIS — U071 COVID-19: Secondary | ICD-10-CM | POA: Diagnosis not present

## 2022-02-01 DIAGNOSIS — M1990 Unspecified osteoarthritis, unspecified site: Secondary | ICD-10-CM | POA: Diagnosis not present

## 2022-02-06 DIAGNOSIS — M1990 Unspecified osteoarthritis, unspecified site: Secondary | ICD-10-CM | POA: Diagnosis not present

## 2022-02-06 DIAGNOSIS — U071 COVID-19: Secondary | ICD-10-CM | POA: Diagnosis not present

## 2022-02-06 DIAGNOSIS — M6281 Muscle weakness (generalized): Secondary | ICD-10-CM | POA: Diagnosis not present

## 2022-02-06 DIAGNOSIS — R2689 Other abnormalities of gait and mobility: Secondary | ICD-10-CM | POA: Diagnosis not present

## 2022-02-06 DIAGNOSIS — G309 Alzheimer's disease, unspecified: Secondary | ICD-10-CM | POA: Diagnosis not present

## 2022-02-06 DIAGNOSIS — R278 Other lack of coordination: Secondary | ICD-10-CM | POA: Diagnosis not present

## 2022-02-06 DIAGNOSIS — I4891 Unspecified atrial fibrillation: Secondary | ICD-10-CM | POA: Diagnosis not present

## 2022-02-06 DIAGNOSIS — R2681 Unsteadiness on feet: Secondary | ICD-10-CM | POA: Diagnosis not present

## 2022-02-07 DIAGNOSIS — M1990 Unspecified osteoarthritis, unspecified site: Secondary | ICD-10-CM | POA: Diagnosis not present

## 2022-02-07 DIAGNOSIS — M6281 Muscle weakness (generalized): Secondary | ICD-10-CM | POA: Diagnosis not present

## 2022-02-07 DIAGNOSIS — R2689 Other abnormalities of gait and mobility: Secondary | ICD-10-CM | POA: Diagnosis not present

## 2022-02-07 DIAGNOSIS — R278 Other lack of coordination: Secondary | ICD-10-CM | POA: Diagnosis not present

## 2022-02-07 DIAGNOSIS — R2681 Unsteadiness on feet: Secondary | ICD-10-CM | POA: Diagnosis not present

## 2022-02-07 DIAGNOSIS — U071 COVID-19: Secondary | ICD-10-CM | POA: Diagnosis not present

## 2022-02-07 DIAGNOSIS — I4891 Unspecified atrial fibrillation: Secondary | ICD-10-CM | POA: Diagnosis not present

## 2022-02-07 DIAGNOSIS — G309 Alzheimer's disease, unspecified: Secondary | ICD-10-CM | POA: Diagnosis not present

## 2022-02-08 DIAGNOSIS — G309 Alzheimer's disease, unspecified: Secondary | ICD-10-CM | POA: Diagnosis not present

## 2022-02-08 DIAGNOSIS — U071 COVID-19: Secondary | ICD-10-CM | POA: Diagnosis not present

## 2022-02-08 DIAGNOSIS — R2689 Other abnormalities of gait and mobility: Secondary | ICD-10-CM | POA: Diagnosis not present

## 2022-02-08 DIAGNOSIS — R278 Other lack of coordination: Secondary | ICD-10-CM | POA: Diagnosis not present

## 2022-02-08 DIAGNOSIS — M6281 Muscle weakness (generalized): Secondary | ICD-10-CM | POA: Diagnosis not present

## 2022-02-08 DIAGNOSIS — I4891 Unspecified atrial fibrillation: Secondary | ICD-10-CM | POA: Diagnosis not present

## 2022-02-08 DIAGNOSIS — R2681 Unsteadiness on feet: Secondary | ICD-10-CM | POA: Diagnosis not present

## 2022-02-08 DIAGNOSIS — M1990 Unspecified osteoarthritis, unspecified site: Secondary | ICD-10-CM | POA: Diagnosis not present

## 2022-02-13 DIAGNOSIS — R2681 Unsteadiness on feet: Secondary | ICD-10-CM | POA: Diagnosis not present

## 2022-02-13 DIAGNOSIS — M1990 Unspecified osteoarthritis, unspecified site: Secondary | ICD-10-CM | POA: Diagnosis not present

## 2022-02-13 DIAGNOSIS — I4891 Unspecified atrial fibrillation: Secondary | ICD-10-CM | POA: Diagnosis not present

## 2022-02-13 DIAGNOSIS — R278 Other lack of coordination: Secondary | ICD-10-CM | POA: Diagnosis not present

## 2022-02-13 DIAGNOSIS — M6281 Muscle weakness (generalized): Secondary | ICD-10-CM | POA: Diagnosis not present

## 2022-02-13 DIAGNOSIS — G309 Alzheimer's disease, unspecified: Secondary | ICD-10-CM | POA: Diagnosis not present

## 2022-02-13 DIAGNOSIS — U071 COVID-19: Secondary | ICD-10-CM | POA: Diagnosis not present

## 2022-02-13 DIAGNOSIS — R2689 Other abnormalities of gait and mobility: Secondary | ICD-10-CM | POA: Diagnosis not present

## 2022-02-16 DIAGNOSIS — R278 Other lack of coordination: Secondary | ICD-10-CM | POA: Diagnosis not present

## 2022-02-16 DIAGNOSIS — R2681 Unsteadiness on feet: Secondary | ICD-10-CM | POA: Diagnosis not present

## 2022-02-16 DIAGNOSIS — I4891 Unspecified atrial fibrillation: Secondary | ICD-10-CM | POA: Diagnosis not present

## 2022-02-16 DIAGNOSIS — M1990 Unspecified osteoarthritis, unspecified site: Secondary | ICD-10-CM | POA: Diagnosis not present

## 2022-02-16 DIAGNOSIS — M6281 Muscle weakness (generalized): Secondary | ICD-10-CM | POA: Diagnosis not present

## 2022-02-16 DIAGNOSIS — U071 COVID-19: Secondary | ICD-10-CM | POA: Diagnosis not present

## 2022-02-16 DIAGNOSIS — G309 Alzheimer's disease, unspecified: Secondary | ICD-10-CM | POA: Diagnosis not present

## 2022-02-16 DIAGNOSIS — R2689 Other abnormalities of gait and mobility: Secondary | ICD-10-CM | POA: Diagnosis not present

## 2022-02-21 DIAGNOSIS — M1990 Unspecified osteoarthritis, unspecified site: Secondary | ICD-10-CM | POA: Diagnosis not present

## 2022-02-21 DIAGNOSIS — G309 Alzheimer's disease, unspecified: Secondary | ICD-10-CM | POA: Diagnosis not present

## 2022-02-21 DIAGNOSIS — I4891 Unspecified atrial fibrillation: Secondary | ICD-10-CM | POA: Diagnosis not present

## 2022-02-21 DIAGNOSIS — R2689 Other abnormalities of gait and mobility: Secondary | ICD-10-CM | POA: Diagnosis not present

## 2022-02-21 DIAGNOSIS — U071 COVID-19: Secondary | ICD-10-CM | POA: Diagnosis not present

## 2022-02-21 DIAGNOSIS — R278 Other lack of coordination: Secondary | ICD-10-CM | POA: Diagnosis not present

## 2022-02-21 DIAGNOSIS — M6281 Muscle weakness (generalized): Secondary | ICD-10-CM | POA: Diagnosis not present

## 2022-02-21 DIAGNOSIS — R2681 Unsteadiness on feet: Secondary | ICD-10-CM | POA: Diagnosis not present

## 2022-02-22 DIAGNOSIS — M6281 Muscle weakness (generalized): Secondary | ICD-10-CM | POA: Diagnosis not present

## 2022-02-22 DIAGNOSIS — R2689 Other abnormalities of gait and mobility: Secondary | ICD-10-CM | POA: Diagnosis not present

## 2022-02-22 DIAGNOSIS — G309 Alzheimer's disease, unspecified: Secondary | ICD-10-CM | POA: Diagnosis not present

## 2022-02-22 DIAGNOSIS — I4891 Unspecified atrial fibrillation: Secondary | ICD-10-CM | POA: Diagnosis not present

## 2022-02-22 DIAGNOSIS — R278 Other lack of coordination: Secondary | ICD-10-CM | POA: Diagnosis not present

## 2022-02-22 DIAGNOSIS — M1990 Unspecified osteoarthritis, unspecified site: Secondary | ICD-10-CM | POA: Diagnosis not present

## 2022-02-22 DIAGNOSIS — R2681 Unsteadiness on feet: Secondary | ICD-10-CM | POA: Diagnosis not present

## 2022-02-22 DIAGNOSIS — U071 COVID-19: Secondary | ICD-10-CM | POA: Diagnosis not present

## 2022-02-26 ENCOUNTER — Other Ambulatory Visit: Payer: Self-pay | Admitting: Student

## 2022-02-26 DIAGNOSIS — R278 Other lack of coordination: Secondary | ICD-10-CM | POA: Diagnosis not present

## 2022-02-26 DIAGNOSIS — R29898 Other symptoms and signs involving the musculoskeletal system: Secondary | ICD-10-CM | POA: Insufficient documentation

## 2022-02-26 DIAGNOSIS — R2689 Other abnormalities of gait and mobility: Secondary | ICD-10-CM | POA: Diagnosis not present

## 2022-02-26 DIAGNOSIS — U071 COVID-19: Secondary | ICD-10-CM | POA: Diagnosis not present

## 2022-02-26 DIAGNOSIS — I4891 Unspecified atrial fibrillation: Secondary | ICD-10-CM | POA: Diagnosis not present

## 2022-02-26 DIAGNOSIS — R2681 Unsteadiness on feet: Secondary | ICD-10-CM | POA: Diagnosis not present

## 2022-02-26 DIAGNOSIS — M6281 Muscle weakness (generalized): Secondary | ICD-10-CM | POA: Diagnosis not present

## 2022-02-26 DIAGNOSIS — G309 Alzheimer's disease, unspecified: Secondary | ICD-10-CM | POA: Diagnosis not present

## 2022-02-26 DIAGNOSIS — M19019 Primary osteoarthritis, unspecified shoulder: Secondary | ICD-10-CM | POA: Insufficient documentation

## 2022-02-26 DIAGNOSIS — M1990 Unspecified osteoarthritis, unspecified site: Secondary | ICD-10-CM | POA: Diagnosis not present

## 2022-02-26 NOTE — Progress Notes (Signed)
Updated medication list and problem list from external EMR.

## 2022-02-28 DIAGNOSIS — U071 COVID-19: Secondary | ICD-10-CM | POA: Diagnosis not present

## 2022-02-28 DIAGNOSIS — R278 Other lack of coordination: Secondary | ICD-10-CM | POA: Diagnosis not present

## 2022-02-28 DIAGNOSIS — R2681 Unsteadiness on feet: Secondary | ICD-10-CM | POA: Diagnosis not present

## 2022-02-28 DIAGNOSIS — I4891 Unspecified atrial fibrillation: Secondary | ICD-10-CM | POA: Diagnosis not present

## 2022-02-28 DIAGNOSIS — M1990 Unspecified osteoarthritis, unspecified site: Secondary | ICD-10-CM | POA: Diagnosis not present

## 2022-02-28 DIAGNOSIS — G309 Alzheimer's disease, unspecified: Secondary | ICD-10-CM | POA: Diagnosis not present

## 2022-02-28 DIAGNOSIS — R2689 Other abnormalities of gait and mobility: Secondary | ICD-10-CM | POA: Diagnosis not present

## 2022-02-28 DIAGNOSIS — M6281 Muscle weakness (generalized): Secondary | ICD-10-CM | POA: Diagnosis not present

## 2022-03-02 DIAGNOSIS — I4891 Unspecified atrial fibrillation: Secondary | ICD-10-CM | POA: Diagnosis not present

## 2022-03-02 DIAGNOSIS — U071 COVID-19: Secondary | ICD-10-CM | POA: Diagnosis not present

## 2022-03-02 DIAGNOSIS — G309 Alzheimer's disease, unspecified: Secondary | ICD-10-CM | POA: Diagnosis not present

## 2022-03-02 DIAGNOSIS — M1990 Unspecified osteoarthritis, unspecified site: Secondary | ICD-10-CM | POA: Diagnosis not present

## 2022-03-02 DIAGNOSIS — R278 Other lack of coordination: Secondary | ICD-10-CM | POA: Diagnosis not present

## 2022-03-02 DIAGNOSIS — R2689 Other abnormalities of gait and mobility: Secondary | ICD-10-CM | POA: Diagnosis not present

## 2022-03-02 DIAGNOSIS — R2681 Unsteadiness on feet: Secondary | ICD-10-CM | POA: Diagnosis not present

## 2022-03-02 DIAGNOSIS — M6281 Muscle weakness (generalized): Secondary | ICD-10-CM | POA: Diagnosis not present

## 2022-03-06 DIAGNOSIS — M6281 Muscle weakness (generalized): Secondary | ICD-10-CM | POA: Diagnosis not present

## 2022-03-06 DIAGNOSIS — G309 Alzheimer's disease, unspecified: Secondary | ICD-10-CM | POA: Diagnosis not present

## 2022-03-06 DIAGNOSIS — R2689 Other abnormalities of gait and mobility: Secondary | ICD-10-CM | POA: Diagnosis not present

## 2022-03-06 DIAGNOSIS — R2681 Unsteadiness on feet: Secondary | ICD-10-CM | POA: Diagnosis not present

## 2022-03-06 DIAGNOSIS — U071 COVID-19: Secondary | ICD-10-CM | POA: Diagnosis not present

## 2022-03-06 DIAGNOSIS — R278 Other lack of coordination: Secondary | ICD-10-CM | POA: Diagnosis not present

## 2022-03-06 DIAGNOSIS — M1990 Unspecified osteoarthritis, unspecified site: Secondary | ICD-10-CM | POA: Diagnosis not present

## 2022-03-06 DIAGNOSIS — I4891 Unspecified atrial fibrillation: Secondary | ICD-10-CM | POA: Diagnosis not present

## 2022-03-07 DIAGNOSIS — R2689 Other abnormalities of gait and mobility: Secondary | ICD-10-CM | POA: Diagnosis not present

## 2022-03-07 DIAGNOSIS — I4891 Unspecified atrial fibrillation: Secondary | ICD-10-CM | POA: Diagnosis not present

## 2022-03-07 DIAGNOSIS — G309 Alzheimer's disease, unspecified: Secondary | ICD-10-CM | POA: Diagnosis not present

## 2022-03-07 DIAGNOSIS — R2681 Unsteadiness on feet: Secondary | ICD-10-CM | POA: Diagnosis not present

## 2022-03-07 DIAGNOSIS — R278 Other lack of coordination: Secondary | ICD-10-CM | POA: Diagnosis not present

## 2022-03-07 DIAGNOSIS — M6281 Muscle weakness (generalized): Secondary | ICD-10-CM | POA: Diagnosis not present

## 2022-03-07 DIAGNOSIS — U071 COVID-19: Secondary | ICD-10-CM | POA: Diagnosis not present

## 2022-03-07 DIAGNOSIS — M1990 Unspecified osteoarthritis, unspecified site: Secondary | ICD-10-CM | POA: Diagnosis not present

## 2022-03-08 DIAGNOSIS — F0150 Vascular dementia without behavioral disturbance: Secondary | ICD-10-CM | POA: Diagnosis not present

## 2022-03-08 DIAGNOSIS — M5412 Radiculopathy, cervical region: Secondary | ICD-10-CM | POA: Diagnosis not present

## 2022-03-08 DIAGNOSIS — I480 Paroxysmal atrial fibrillation: Secondary | ICD-10-CM | POA: Diagnosis not present

## 2022-03-08 DIAGNOSIS — G301 Alzheimer's disease with late onset: Secondary | ICD-10-CM | POA: Diagnosis not present

## 2022-03-08 DIAGNOSIS — F39 Unspecified mood [affective] disorder: Secondary | ICD-10-CM | POA: Diagnosis not present

## 2022-03-09 DIAGNOSIS — G309 Alzheimer's disease, unspecified: Secondary | ICD-10-CM | POA: Diagnosis not present

## 2022-03-09 DIAGNOSIS — R278 Other lack of coordination: Secondary | ICD-10-CM | POA: Diagnosis not present

## 2022-03-09 DIAGNOSIS — U071 COVID-19: Secondary | ICD-10-CM | POA: Diagnosis not present

## 2022-03-09 DIAGNOSIS — R2689 Other abnormalities of gait and mobility: Secondary | ICD-10-CM | POA: Diagnosis not present

## 2022-03-09 DIAGNOSIS — I4891 Unspecified atrial fibrillation: Secondary | ICD-10-CM | POA: Diagnosis not present

## 2022-03-09 DIAGNOSIS — M1990 Unspecified osteoarthritis, unspecified site: Secondary | ICD-10-CM | POA: Diagnosis not present

## 2022-03-09 DIAGNOSIS — M6281 Muscle weakness (generalized): Secondary | ICD-10-CM | POA: Diagnosis not present

## 2022-03-09 DIAGNOSIS — R2681 Unsteadiness on feet: Secondary | ICD-10-CM | POA: Diagnosis not present

## 2022-03-12 DIAGNOSIS — M1990 Unspecified osteoarthritis, unspecified site: Secondary | ICD-10-CM | POA: Diagnosis not present

## 2022-03-12 DIAGNOSIS — M6281 Muscle weakness (generalized): Secondary | ICD-10-CM | POA: Diagnosis not present

## 2022-03-12 DIAGNOSIS — U071 COVID-19: Secondary | ICD-10-CM | POA: Diagnosis not present

## 2022-03-12 DIAGNOSIS — I4891 Unspecified atrial fibrillation: Secondary | ICD-10-CM | POA: Diagnosis not present

## 2022-03-12 DIAGNOSIS — R278 Other lack of coordination: Secondary | ICD-10-CM | POA: Diagnosis not present

## 2022-03-12 DIAGNOSIS — R2689 Other abnormalities of gait and mobility: Secondary | ICD-10-CM | POA: Diagnosis not present

## 2022-03-12 DIAGNOSIS — G309 Alzheimer's disease, unspecified: Secondary | ICD-10-CM | POA: Diagnosis not present

## 2022-03-12 DIAGNOSIS — R2681 Unsteadiness on feet: Secondary | ICD-10-CM | POA: Diagnosis not present

## 2022-03-14 DIAGNOSIS — U071 COVID-19: Secondary | ICD-10-CM | POA: Diagnosis not present

## 2022-03-14 DIAGNOSIS — R2681 Unsteadiness on feet: Secondary | ICD-10-CM | POA: Diagnosis not present

## 2022-03-14 DIAGNOSIS — G309 Alzheimer's disease, unspecified: Secondary | ICD-10-CM | POA: Diagnosis not present

## 2022-03-14 DIAGNOSIS — I4891 Unspecified atrial fibrillation: Secondary | ICD-10-CM | POA: Diagnosis not present

## 2022-03-14 DIAGNOSIS — R278 Other lack of coordination: Secondary | ICD-10-CM | POA: Diagnosis not present

## 2022-03-14 DIAGNOSIS — M6281 Muscle weakness (generalized): Secondary | ICD-10-CM | POA: Diagnosis not present

## 2022-03-14 DIAGNOSIS — M1990 Unspecified osteoarthritis, unspecified site: Secondary | ICD-10-CM | POA: Diagnosis not present

## 2022-03-14 DIAGNOSIS — R2689 Other abnormalities of gait and mobility: Secondary | ICD-10-CM | POA: Diagnosis not present

## 2022-03-19 DIAGNOSIS — I4891 Unspecified atrial fibrillation: Secondary | ICD-10-CM | POA: Diagnosis not present

## 2022-03-19 DIAGNOSIS — R2689 Other abnormalities of gait and mobility: Secondary | ICD-10-CM | POA: Diagnosis not present

## 2022-03-19 DIAGNOSIS — M1990 Unspecified osteoarthritis, unspecified site: Secondary | ICD-10-CM | POA: Diagnosis not present

## 2022-03-19 DIAGNOSIS — R2681 Unsteadiness on feet: Secondary | ICD-10-CM | POA: Diagnosis not present

## 2022-03-19 DIAGNOSIS — M6281 Muscle weakness (generalized): Secondary | ICD-10-CM | POA: Diagnosis not present

## 2022-03-19 DIAGNOSIS — U071 COVID-19: Secondary | ICD-10-CM | POA: Diagnosis not present

## 2022-03-19 DIAGNOSIS — G309 Alzheimer's disease, unspecified: Secondary | ICD-10-CM | POA: Diagnosis not present

## 2022-03-19 DIAGNOSIS — R278 Other lack of coordination: Secondary | ICD-10-CM | POA: Diagnosis not present

## 2022-03-20 DIAGNOSIS — M6281 Muscle weakness (generalized): Secondary | ICD-10-CM | POA: Diagnosis not present

## 2022-03-20 DIAGNOSIS — R278 Other lack of coordination: Secondary | ICD-10-CM | POA: Diagnosis not present

## 2022-03-20 DIAGNOSIS — I4891 Unspecified atrial fibrillation: Secondary | ICD-10-CM | POA: Diagnosis not present

## 2022-03-20 DIAGNOSIS — M1990 Unspecified osteoarthritis, unspecified site: Secondary | ICD-10-CM | POA: Diagnosis not present

## 2022-03-20 DIAGNOSIS — R2689 Other abnormalities of gait and mobility: Secondary | ICD-10-CM | POA: Diagnosis not present

## 2022-03-20 DIAGNOSIS — U071 COVID-19: Secondary | ICD-10-CM | POA: Diagnosis not present

## 2022-03-20 DIAGNOSIS — G309 Alzheimer's disease, unspecified: Secondary | ICD-10-CM | POA: Diagnosis not present

## 2022-03-20 DIAGNOSIS — R2681 Unsteadiness on feet: Secondary | ICD-10-CM | POA: Diagnosis not present

## 2022-03-21 DIAGNOSIS — M6281 Muscle weakness (generalized): Secondary | ICD-10-CM | POA: Diagnosis not present

## 2022-03-21 DIAGNOSIS — R278 Other lack of coordination: Secondary | ICD-10-CM | POA: Diagnosis not present

## 2022-03-21 DIAGNOSIS — M1990 Unspecified osteoarthritis, unspecified site: Secondary | ICD-10-CM | POA: Diagnosis not present

## 2022-03-21 DIAGNOSIS — I4891 Unspecified atrial fibrillation: Secondary | ICD-10-CM | POA: Diagnosis not present

## 2022-03-21 DIAGNOSIS — R2681 Unsteadiness on feet: Secondary | ICD-10-CM | POA: Diagnosis not present

## 2022-03-21 DIAGNOSIS — R2689 Other abnormalities of gait and mobility: Secondary | ICD-10-CM | POA: Diagnosis not present

## 2022-03-21 DIAGNOSIS — U071 COVID-19: Secondary | ICD-10-CM | POA: Diagnosis not present

## 2022-03-21 DIAGNOSIS — G309 Alzheimer's disease, unspecified: Secondary | ICD-10-CM | POA: Diagnosis not present

## 2022-03-26 DIAGNOSIS — R2689 Other abnormalities of gait and mobility: Secondary | ICD-10-CM | POA: Diagnosis not present

## 2022-03-26 DIAGNOSIS — R2681 Unsteadiness on feet: Secondary | ICD-10-CM | POA: Diagnosis not present

## 2022-03-26 DIAGNOSIS — M6281 Muscle weakness (generalized): Secondary | ICD-10-CM | POA: Diagnosis not present

## 2022-03-26 DIAGNOSIS — I4891 Unspecified atrial fibrillation: Secondary | ICD-10-CM | POA: Diagnosis not present

## 2022-03-26 DIAGNOSIS — M1990 Unspecified osteoarthritis, unspecified site: Secondary | ICD-10-CM | POA: Diagnosis not present

## 2022-03-26 DIAGNOSIS — G309 Alzheimer's disease, unspecified: Secondary | ICD-10-CM | POA: Diagnosis not present

## 2022-03-26 DIAGNOSIS — R278 Other lack of coordination: Secondary | ICD-10-CM | POA: Diagnosis not present

## 2022-03-26 DIAGNOSIS — U071 COVID-19: Secondary | ICD-10-CM | POA: Diagnosis not present

## 2022-03-27 DIAGNOSIS — R2689 Other abnormalities of gait and mobility: Secondary | ICD-10-CM | POA: Diagnosis not present

## 2022-03-27 DIAGNOSIS — R278 Other lack of coordination: Secondary | ICD-10-CM | POA: Diagnosis not present

## 2022-03-27 DIAGNOSIS — G309 Alzheimer's disease, unspecified: Secondary | ICD-10-CM | POA: Diagnosis not present

## 2022-03-27 DIAGNOSIS — M1990 Unspecified osteoarthritis, unspecified site: Secondary | ICD-10-CM | POA: Diagnosis not present

## 2022-03-27 DIAGNOSIS — I4891 Unspecified atrial fibrillation: Secondary | ICD-10-CM | POA: Diagnosis not present

## 2022-03-27 DIAGNOSIS — M6281 Muscle weakness (generalized): Secondary | ICD-10-CM | POA: Diagnosis not present

## 2022-03-27 DIAGNOSIS — U071 COVID-19: Secondary | ICD-10-CM | POA: Diagnosis not present

## 2022-03-27 DIAGNOSIS — R2681 Unsteadiness on feet: Secondary | ICD-10-CM | POA: Diagnosis not present

## 2022-04-11 ENCOUNTER — Non-Acute Institutional Stay (SKILLED_NURSING_FACILITY): Payer: Medicare PPO | Admitting: Student

## 2022-04-11 ENCOUNTER — Encounter: Payer: Self-pay | Admitting: Student

## 2022-04-11 DIAGNOSIS — D485 Neoplasm of uncertain behavior of skin: Secondary | ICD-10-CM

## 2022-04-11 NOTE — Progress Notes (Signed)
Location:  Other [facility]) Nursing Home Room Number: 103-P Place of Service:  ALF [phone]) Provider:Nolah Krenzer, [place], MD  Patient Care Team: Dewayne Shorter, MD as PCP - General [facility])  Extended Emergency Contact Information Primary Emergency Contact: [name],[name] Address: [address]          [place], [place] [address] Johnnette Litter of [place] Phone: [phone] Mobile Phone: [phone] Relation: Daughter Secondary Emergency Contact: Lindsay of [place] Mobile Phone: [phone] Relation: Daughter  Code Status:  Full Code  Goals of care: Advanced Directive information    04/11/2022    8:54 AM  Advanced Directives  Does Patient Have a Medical Advance Directive? Yes  Type of Paramedic of [place];Living will  Does patient want to make changes to medical advance directive? No - Patient declined  Copy of [place] in Chart? Yes - validated most recent copy scanned in chart (See row information)     Chief Complaint  Patient presents with   Acute Visit    Skin Lesion     HPI:  Pt is a 88 y.o. male seen today for an acute visit for skin nodule. Patient has a history of SCC per medical history. Nursing staff states that patient's skin lesion has grown rapidly over the last 4 months. He has had Aks frozen off in the past. Patient is not oriented and this is his baseline. Unable to tell name, birth. He can say that an old picture from his wedding is him and a beautiful woman. Nursing states that he has moments of clarity. When asked directly, "is your name Austin [name]?" He will answer, "yes."  Spoke to patient's daughter, Ciro Backer to determine goals of care. Unable to contact, left voicemail.   Past Medical History:  Diagnosis Date   Alzheimer's dementia ([place])    ALZHEIMERS   Atrial fibrillation ([place])    REVEAL DEVICE MEDTRONIC SERIAL #[id number] S MODEL #[id number]   Coronary  atherosclerosis of native coronary artery    MI 2014   GERD (gastroesophageal reflux disease)    REFLUX   Gout    History of prostate cancer    HTN (hypertension)    LPRD (laryngopharyngeal reflux disease)    Osteoarthritis, generalized    DJD neck and spine   RBBB    RBBB (right bundle branch block)    Past Surgical History:  Procedure Laterality Date   ABDOMINAL ADHESION SURGERY     APPENDECTOMY     DIRECT LARYNGOSCOPY Left 04/19/2016   Procedure: DIRECT MICROSCOPIC LARYNGOSCOPY WITH BIOPSY;  Surgeon: Margaretha [place], MD;  Location: [place];  Service: ENT;  Laterality: Left;   EP IMPLANTABLE DEVICE     Reveal device Medtronic Serial #[id number]  Model #[id number]   INGUINAL HERNIA REPAIR Left    KNEE SURGERY     PROSTATECTOMY     SHOULDER ARTHROSCOPY     TREATMENT FISTULA ANAL      Allergies  Allergen Reactions   Amiodarone Other (See Comments)    Trouble waking/falls   Multaq [Dronedarone] Other (See Comments)    unknown   Oxycodone    Albuterol Palpitations    Outpatient Encounter Medications as of 04/11/2022  Medication Sig   acetaminophen (TYLENOL) 500 MG tablet Take 1,000 mg by mouth 2 (two) times daily.   amoxicillin (AMOXIL) 500 MG capsule Take 2,000 mg by mouth daily as needed. Give 1 hour prior to dental procedure   apixaban (ELIQUIS) 5 MG TABS tablet Take  1 tablet (5 mg total) by mouth 2 (two) times daily.   bisacodyl (DULCOLAX) 10 MG suppository Place 10 mg rectally daily as needed for moderate constipation.   diazepam (VALIUM) 5 MG tablet Take 5 mg by mouth daily. In the afternoon, scheduled, and 1/2 tablet (2.5 mg) every 4 hours as needed for anxiety related pain   diclofenac Sodium (VOLTAREN) 1 % GEL Apply 2 g topically 4 (four) times daily.   donepezil (ARICEPT) 10 MG tablet Take 10 mg by mouth daily.   melatonin 5 MG TABS Take 5 mg by mouth at bedtime. For poor sleep quality   memantine (NAMENDA) 10 MG tablet Take 10 mg by mouth 2 (two) times  daily.   Multiple Vitamins-Minerals (THERAGRAN-M PO) Take 1 tablet by mouth daily.   polyethylene glycol (MIRALAX / GLYCOLAX) 17 g packet Take 17 g by mouth every other day. Scheduled and 1 scoop as needed for constipation daily in liquid of choice   senna-docusate (SENOKOT-S) 8.6-50 MG tablet Take 2 tablets by mouth at bedtime.   [DISCONTINUED] donepezil (ARICEPT) 10 MG tablet Take 1 tablet (10 mg total) by mouth 2 (two) times daily. (Patient taking differently: Take 10 mg by mouth at bedtime. )   [DISCONTINUED] memantine (NAMENDA) 5 MG tablet Take 10 mg by mouth 2 (two) times daily.   [DISCONTINUED] metoprolol succinate (TOPROL-XL) 50 MG 24 hr tablet Take 1 tablet (50 mg total) by mouth daily. Take with or immediately following a meal. (Patient not taking: Reported on 09/20/2019)   [DISCONTINUED] vitamin B-12 (CYANOCOBALAMIN) 1000 MCG tablet Take 1,000 mcg by mouth daily.   No facility-administered encounter medications on file as of 04/11/2022.    Review of Systems  Unable to perform ROS: Dementia (per nursing, no weightloss, fevers, etc.)    Immunization History  Administered Date(s) Administered   Influenza-Unspecified 05/02/2015   Td 04/20/2002   Tdap 12/08/2008   Zoster, Live 11/12/2011   Pertinent  Health Maintenance Due  Topic Date Due   INFLUENZA VACCINE  01/30/2022      09/20/2019    9:34 AM 10/13/2019    7:18 PM  Fall Risk  Patient Fall Risk Level High fall risk High fall risk   Functional Status Survey:    Vitals:   04/11/22 0847  BP: 124/67  Pulse: 74  Resp: 18  Temp: 98 F (36.7 C)  SpO2: 93%  Weight: 149 lb 12.8 oz (67.9 kg)  Height: '5\' 3"'$  (1.6 m)   Body mass index is 26.54 kg/m. Physical Exam Constitutional:      Appearance: Normal appearance.  Cardiovascular:     Rate and Rhythm: Normal rate and regular rhythm.     Pulses: Normal pulses.     Heart sounds: Normal heart sounds.  Pulmonary:     Effort: Pulmonary effort is normal.  Abdominal:      General: Bowel sounds are normal.     Palpations: Abdomen is soft.  Skin:    Capillary Refill: Capillary refill takes 2 to 3 seconds.  Neurological:     Mental Status: He is alert.     Labs reviewed: No results for input(s): "NA", "K", "CL", "CO2", "GLUCOSE", "BUN", "CREATININE", "CALCIUM", "MG", "PHOS" in the last 8760 hours. No results for input(s): "AST", "ALT", "ALKPHOS", "BILITOT", "PROT", "ALBUMIN" in the last 8760 hours. No results for input(s): "WBC", "NEUTROABS", "HGB", "HCT", "MCV", "PLT" in the last 8760 hours. No results found for: "TSH" No results found for: "HGBA1C" Lab Results  Component Value Date  CHOL 178 05/15/2016   HDL 53 05/15/2016   LDLCALC 104 (H) 05/15/2016   TRIG 106 05/15/2016   CHOLHDL 3.4 05/15/2016    Significant Diagnostic Results in last 30 days:  No results found.  Assessment/Plan 1. Neoplasm of uncertain behavior of scalp Patient has a nodule of the scalp > 1cm. Notable dried blood in the posterior portion most concerning for some irritation and inflammation. Ddx includes Basal Cell, seborrheic keratosis, Squamous Cell. Given uncertainty, and size, unable to treat with cryotherapy. Will plan for referral to dermatology. Given size of lesion and patient's current stage of dementia, concern for ability to tolerate removal. Will need continued conversations with family regarding goals of care regarding this nodule. If patient needs sedation very high risk for delirium given age, dementia, comorbid condition.    Family/ staff Communication: nurse susan, Attempted call to daughter  Labs/tests ordered:  Derm consultation  Tomasa Rand, MD, [place] [phone]

## 2022-04-12 DIAGNOSIS — D485 Neoplasm of uncertain behavior of skin: Secondary | ICD-10-CM | POA: Insufficient documentation

## 2022-04-12 DIAGNOSIS — G253 Myoclonus: Secondary | ICD-10-CM | POA: Insufficient documentation

## 2022-05-01 DIAGNOSIS — L821 Other seborrheic keratosis: Secondary | ICD-10-CM | POA: Diagnosis not present

## 2022-05-01 DIAGNOSIS — D485 Neoplasm of uncertain behavior of skin: Secondary | ICD-10-CM | POA: Diagnosis not present

## 2022-05-01 DIAGNOSIS — C4442 Squamous cell carcinoma of skin of scalp and neck: Secondary | ICD-10-CM | POA: Diagnosis not present

## 2022-05-03 ENCOUNTER — Other Ambulatory Visit: Payer: Self-pay | Admitting: Nurse Practitioner

## 2022-05-03 DIAGNOSIS — D649 Anemia, unspecified: Secondary | ICD-10-CM | POA: Diagnosis not present

## 2022-05-03 DIAGNOSIS — Z79899 Other long term (current) drug therapy: Secondary | ICD-10-CM | POA: Diagnosis not present

## 2022-05-03 LAB — BASIC METABOLIC PANEL
BUN: 20 (ref 4–21)
CO2: 30 — AB (ref 13–22)
Chloride: 103 (ref 99–108)
Creatinine: 0.9 (ref 0.6–1.3)
Glucose: 86
Potassium: 4 mEq/L (ref 3.5–5.1)
Sodium: 139 (ref 137–147)

## 2022-05-03 LAB — HEPATIC FUNCTION PANEL
ALT: 11 U/L (ref 10–40)
AST: 20 (ref 14–40)
Alkaline Phosphatase: 46 (ref 25–125)
Bilirubin, Total: 0.6

## 2022-05-03 LAB — COMPREHENSIVE METABOLIC PANEL
Albumin: 4.3 (ref 3.5–5.0)
Calcium: 9.5 (ref 8.7–10.7)
Globulin: 2.1
eGFR: 80

## 2022-05-03 LAB — CBC AND DIFFERENTIAL
HCT: 43 (ref 41–53)
Hemoglobin: 14.7 (ref 13.5–17.5)
Neutrophils Absolute: 3447
Platelets: 137 10*3/uL — AB (ref 150–400)
WBC: 6.2

## 2022-05-03 LAB — CBC: RBC: 4.78 (ref 3.87–5.11)

## 2022-05-03 MED ORDER — DIAZEPAM 5 MG PO TABS: 5.0000 mg | ORAL_TABLET | Freq: Every day | ORAL | 5 refills | Status: DC

## 2022-05-04 ENCOUNTER — Encounter: Payer: Self-pay | Admitting: Student

## 2022-06-05 ENCOUNTER — Encounter: Payer: Self-pay | Admitting: Nurse Practitioner

## 2022-06-05 ENCOUNTER — Non-Acute Institutional Stay: Payer: Medicare PPO | Admitting: Nurse Practitioner

## 2022-06-05 DIAGNOSIS — F419 Anxiety disorder, unspecified: Secondary | ICD-10-CM | POA: Diagnosis not present

## 2022-06-05 DIAGNOSIS — D6869 Other thrombophilia: Secondary | ICD-10-CM | POA: Diagnosis not present

## 2022-06-05 DIAGNOSIS — G309 Alzheimer's disease, unspecified: Secondary | ICD-10-CM | POA: Diagnosis not present

## 2022-06-05 DIAGNOSIS — I4891 Unspecified atrial fibrillation: Secondary | ICD-10-CM | POA: Diagnosis not present

## 2022-06-05 DIAGNOSIS — M159 Polyosteoarthritis, unspecified: Secondary | ICD-10-CM

## 2022-06-05 DIAGNOSIS — G4700 Insomnia, unspecified: Secondary | ICD-10-CM

## 2022-06-05 DIAGNOSIS — F02818 Dementia in other diseases classified elsewhere, unspecified severity, with other behavioral disturbance: Secondary | ICD-10-CM

## 2022-06-05 NOTE — Progress Notes (Unsigned)
Location:  Other [facility]) Nursing Home Room Number: 103-P Place of Service:  ALF [phone]) Provider:  Carlos American. Dewaine Oats, NP    Patient Care Team: Dewayne Shorter, MD as PCP - [facility])  Extended Emergency Contact Information Primary Emergency Contact: [name],[name] Address: [address]          [place], [place] [address] Johnnette Litter of [place] Phone: [phone] Mobile Phone: [phone] Relation: Daughter Secondary Emergency Contact: Viviann Spare [place] of [place] Mobile Phone: [phone] Relation: Daughter  Code Status:  Full Code  Goals of care: Advanced Directive information    06/05/2022    8:57 AM  Advanced Directives  Does Patient Have a Medical Advance Directive? Yes  Type of Paramedic of [place];Living will  Does patient want to make changes to medical advance directive? No - Patient declined  Copy of [place] in Chart? Yes - validated most recent copy scanned in chart (See row information)     Chief Complaint  Patient presents with   Medical Management of Chronic Issues    Routine visit. Discuss need AWV, covid boosters, shingrix, pneumonia vaccines, flu vaccine and td/tdap or post pone if patient refuses. Patient is not active in [place]. Vitals and medications are a reflection of [facility]MR system, [facility]      HPI:  Pt is a 88 y.o. male seen today for medical management of chronic diseases.   Pt has been doing well- went to [facility] and had biopsy of lesion on head- awaiting pathology.  He is eating well. No signs of worsening pain.  He feeds himself but requires helps with his ADLs.  Sleeping well at night.  Continues to have anxiety but controlled on valium  Nursing without acute concerns    Past Medical History:  Diagnosis Date   Alzheimer's dementia ([place])    ALZHEIMERS   Atrial fibrillation ([place])    REVEAL DEVICE MEDTRONIC SERIAL #[id number]  MODEL #[id number]   Coronary atherosclerosis of native coronary artery    MI 2014   GERD (gastroesophageal reflux disease)    REFLUX   Gout    History of prostate cancer    HTN (hypertension)    LPRD (laryngopharyngeal reflux disease)    Osteoarthritis, generalized    DJD neck and spine   RBBB    RBBB (right bundle branch block)    Past Surgical History:  Procedure Laterality Date   ABDOMINAL ADHESION SURGERY     APPENDECTOMY     DIRECT LARYNGOSCOPY Left 04/19/2016   Procedure: DIRECT MICROSCOPIC LARYNGOSCOPY WITH BIOPSY;  Surgeon: Margaretha Sheffield, MD;  Location: [facility];  Service: ENT;  Laterality: Left;   EP IMPLANTABLE DEVICE     Reveal device Medtronic Serial #[id number]  Model #[id number]   INGUINAL HERNIA REPAIR Left    KNEE SURGERY     PROSTATECTOMY     SHOULDER ARTHROSCOPY     TREATMENT FISTULA ANAL      Allergies  Allergen Reactions   Amiodarone Other (See Comments)    Trouble waking/falls   Multaq [Dronedarone] Other (See Comments)    unknown   Oxycodone    Albuterol Palpitations    Outpatient Encounter Medications as of 06/05/2022  Medication Sig   acetaminophen (TYLENOL) 500 MG tablet Take 1,000 mg by mouth 2 (two) times daily.   amoxicillin (AMOXIL) 500 MG capsule Take 2,000 mg by mouth daily as needed. Give 1 hour prior to dental procedure  apixaban (ELIQUIS) 5 MG TABS tablet Take 1 tablet (5 mg total) by mouth 2 (two) times daily.   bisacodyl (DULCOLAX) 10 MG suppository Place 10 mg rectally daily as needed for moderate constipation.   diazepam (VALIUM) 5 MG tablet Take 1 tablet (5 mg total) by mouth daily. In the afternoon, scheduled, and 1/2 tablet (2.5 mg) every 4 hours as needed for anxiety related pain   diclofenac Sodium (VOLTAREN) 1 % GEL Apply 2 g topically 4 (four) times daily.   donepezil (ARICEPT) 10 MG tablet Take 10 mg by mouth daily.   melatonin 3 MG TABS tablet Take 3 mg by mouth at bedtime.   memantine (NAMENDA) 10 MG tablet Take 10 mg by  mouth 2 (two) times daily.   Multiple Vitamins-Minerals (THERAGRAN-M PO) Take 1 tablet by mouth daily.   polyethylene glycol (MIRALAX / GLYCOLAX) 17 g packet Take 17 g by mouth every other day. Scheduled and 1 scoop as needed for constipation daily in liquid of choice   senna-docusate (SENOKOT-S) 8.6-50 MG tablet Take 2 tablets by mouth at bedtime.   [DISCONTINUED] melatonin 5 MG TABS Take 5 mg by mouth at bedtime. For poor sleep quality   No facility-administered encounter medications on file as of 06/05/2022.    Review of Systems  Unable to perform ROS: Dementia    Immunization History  Administered Date(s) Administered   Influenza-Unspecified 05/02/2015   Moderna Sars-Covid-2 Vaccination 07/12/2019, 05/11/2020   Pneumococcal Conjugate-13 01/31/2019   Td 04/20/2002   Tdap 12/08/2008   Zoster, Live 11/12/2011   Pertinent  Health Maintenance Due  Topic Date Due   INFLUENZA VACCINE  01/30/2022      09/20/2019    9:34 AM 10/13/2019    7:18 PM  Fall Risk  Patient Fall Risk Level High fall risk High fall risk   Functional Status Survey:    Vitals:   06/05/22 0856  BP: 111/66  Pulse: 77  Temp: 98.2 F (36.8 C)  Weight: 148 lb (67.1 kg)  Height: '5\' 3"'$  (1.6 m)   Body mass index is 26.22 kg/m. Physical Exam Constitutional:      General: He is not in acute distress.    Appearance: He is well-developed. He is not diaphoretic.  HENT:     Head: Normocephalic and atraumatic.     Right Ear: External ear normal.     Left Ear: External ear normal.     Mouth/Throat:     Pharynx: No oropharyngeal exudate.  Eyes:     Conjunctiva/sclera: Conjunctivae normal.     Pupils: Pupils are equal, round, and reactive to light.  Cardiovascular:     Rate and Rhythm: Normal rate and regular rhythm.     Heart sounds: Normal heart sounds.  Pulmonary:     Effort: Pulmonary effort is normal.     Breath sounds: Normal breath sounds.  Abdominal:     General: Bowel sounds are normal.      Palpations: Abdomen is soft.  Musculoskeletal:        General: No tenderness.     Cervical back: Normal range of motion and neck supple.     Right lower leg: No edema.     Left lower leg: No edema.  Skin:    General: Skin is warm and dry.  Neurological:     Mental Status: He is alert and oriented to person, place, and time.     Labs reviewed: Recent Labs    05/03/22 0000  NA 139  K  4.0  CL 103  CO2 30*  BUN 20  CREATININE 0.9  CALCIUM 9.5   Recent Labs    05/03/22 0000  AST 20  ALT 11  ALKPHOS 46  ALBUMIN 4.3   Recent Labs    05/03/22 0000  WBC 6.2  NEUTROABS 3,447.00  HGB 14.7  HCT 43  PLT 137*   No results found for: "TSH" No results found for: "HGBA1C" Lab Results  Component Value Date   CHOL 178 05/15/2016   HDL 53 05/15/2016   LDLCALC 104 (H) 05/15/2016   TRIG 106 05/15/2016   CHOLHDL 3.4 05/15/2016    Significant Diagnostic Results in last 30 days:  No results found.  Assessment/Plan 1. Alzheimer's dementia with behavioral disturbance ([place]) -Stable, no acute changes in cognitive or functional status, continue supportive care by staff Continues on namenda and aricept  2. Anxiety Controlled, continues on valium- would continue current dosing at this time due- not able to wean or stop.   3. Atrial fibrillation, unspecified type ([place]) -rate controlled without medication  4. Acquired thrombophilia ([place]) Due to a fib, continues on eliquis, no signs of bleeding or blood loss, hgb stable.   5. Insomnia, unspecified type Well controlled on melatonin  6. Osteoarthritis, generalized -continues on tylenol twice daily, no signs of worsening pain.    Carlos American. [place], [facility] [phone]

## 2022-06-20 ENCOUNTER — Inpatient Hospital Stay
Admission: EM | Admit: 2022-06-20 | Discharge: 2022-06-22 | DRG: 178 | Disposition: A | Discharge status: Skilled Nursing Facility | Payer: Medicare PPO | Source: Skilled Nursing Facility | Attending: Family Medicine | Admitting: Family Medicine

## 2022-06-20 ENCOUNTER — Other Ambulatory Visit: Payer: Self-pay

## 2022-06-20 ENCOUNTER — Emergency Department: Payer: Medicare PPO

## 2022-06-20 DIAGNOSIS — R531 Weakness: Secondary | ICD-10-CM | POA: Diagnosis not present

## 2022-06-20 DIAGNOSIS — U071 COVID-19: Secondary | ICD-10-CM | POA: Diagnosis present

## 2022-06-20 DIAGNOSIS — R404 Transient alteration of awareness: Secondary | ICD-10-CM | POA: Diagnosis not present

## 2022-06-20 DIAGNOSIS — Z885 Allergy status to narcotic agent status: Secondary | ICD-10-CM | POA: Diagnosis not present

## 2022-06-20 DIAGNOSIS — Z79899 Other long term (current) drug therapy: Secondary | ICD-10-CM | POA: Diagnosis not present

## 2022-06-20 DIAGNOSIS — I252 Old myocardial infarction: Secondary | ICD-10-CM | POA: Diagnosis not present

## 2022-06-20 DIAGNOSIS — G309 Alzheimer's disease, unspecified: Secondary | ICD-10-CM | POA: Diagnosis present

## 2022-06-20 DIAGNOSIS — I2510 Atherosclerotic heart disease of native coronary artery without angina pectoris: Secondary | ICD-10-CM | POA: Diagnosis present

## 2022-06-20 DIAGNOSIS — I4891 Unspecified atrial fibrillation: Secondary | ICD-10-CM | POA: Diagnosis present

## 2022-06-20 DIAGNOSIS — R310 Gross hematuria: Secondary | ICD-10-CM | POA: Diagnosis present

## 2022-06-20 DIAGNOSIS — R0689 Other abnormalities of breathing: Secondary | ICD-10-CM | POA: Diagnosis not present

## 2022-06-20 DIAGNOSIS — I10 Essential (primary) hypertension: Secondary | ICD-10-CM | POA: Diagnosis present

## 2022-06-20 DIAGNOSIS — F02818 Dementia in other diseases classified elsewhere, unspecified severity, with other behavioral disturbance: Secondary | ICD-10-CM | POA: Diagnosis present

## 2022-06-20 DIAGNOSIS — I4510 Unspecified right bundle-branch block: Secondary | ICD-10-CM | POA: Diagnosis present

## 2022-06-20 DIAGNOSIS — Z7901 Long term (current) use of anticoagulants: Secondary | ICD-10-CM

## 2022-06-20 DIAGNOSIS — I480 Paroxysmal atrial fibrillation: Secondary | ICD-10-CM | POA: Diagnosis present

## 2022-06-20 DIAGNOSIS — R509 Fever, unspecified: Secondary | ICD-10-CM | POA: Diagnosis not present

## 2022-06-20 DIAGNOSIS — A419 Sepsis, unspecified organism: Secondary | ICD-10-CM | POA: Diagnosis not present

## 2022-06-20 DIAGNOSIS — Z8546 Personal history of malignant neoplasm of prostate: Secondary | ICD-10-CM

## 2022-06-20 DIAGNOSIS — Z9079 Acquired absence of other genital organ(s): Secondary | ICD-10-CM | POA: Diagnosis not present

## 2022-06-20 DIAGNOSIS — R319 Hematuria, unspecified: Secondary | ICD-10-CM | POA: Insufficient documentation

## 2022-06-20 DIAGNOSIS — G9340 Encephalopathy, unspecified: Secondary | ICD-10-CM | POA: Diagnosis present

## 2022-06-20 DIAGNOSIS — M159 Polyosteoarthritis, unspecified: Secondary | ICD-10-CM | POA: Diagnosis present

## 2022-06-20 DIAGNOSIS — R4182 Altered mental status, unspecified: Secondary | ICD-10-CM | POA: Diagnosis not present

## 2022-06-20 DIAGNOSIS — Z471 Aftercare following joint replacement surgery: Secondary | ICD-10-CM | POA: Diagnosis not present

## 2022-06-20 DIAGNOSIS — Z888 Allergy status to other drugs, medicaments and biological substances status: Secondary | ICD-10-CM

## 2022-06-20 DIAGNOSIS — E860 Dehydration: Secondary | ICD-10-CM | POA: Diagnosis present

## 2022-06-20 DIAGNOSIS — F02C18 Dementia in other diseases classified elsewhere, severe, with other behavioral disturbance: Secondary | ICD-10-CM | POA: Diagnosis present

## 2022-06-20 DIAGNOSIS — R000 Tachycardia, unspecified: Secondary | ICD-10-CM | POA: Diagnosis not present

## 2022-06-20 DIAGNOSIS — Z7401 Bed confinement status: Secondary | ICD-10-CM | POA: Diagnosis not present

## 2022-06-20 DIAGNOSIS — I517 Cardiomegaly: Secondary | ICD-10-CM | POA: Diagnosis not present

## 2022-06-20 DIAGNOSIS — Z8249 Family history of ischemic heart disease and other diseases of the circulatory system: Secondary | ICD-10-CM

## 2022-06-20 DIAGNOSIS — R918 Other nonspecific abnormal finding of lung field: Secondary | ICD-10-CM | POA: Diagnosis not present

## 2022-06-20 DIAGNOSIS — W19XXXA Unspecified fall, initial encounter: Secondary | ICD-10-CM | POA: Diagnosis not present

## 2022-06-20 LAB — CBC WITH DIFFERENTIAL/PLATELET
Abs Immature Granulocytes: 0.03 10*3/uL (ref 0.00–0.07)
Basophils Absolute: 0 10*3/uL (ref 0.0–0.1)
Basophils Relative: 0 %
Eosinophils Absolute: 0 10*3/uL (ref 0.0–0.5)
Eosinophils Relative: 0 %
HCT: 45.3 % (ref 39.0–52.0)
Hemoglobin: 14.7 g/dL (ref 13.0–17.0)
Immature Granulocytes: 0 %
Lymphocytes Relative: 11 %
Lymphs Abs: 0.9 10*3/uL (ref 0.7–4.0)
MCH: 30.3 pg (ref 26.0–34.0)
MCHC: 32.5 g/dL (ref 30.0–36.0)
MCV: 93.4 fL (ref 80.0–100.0)
Monocytes Absolute: 0.9 10*3/uL (ref 0.1–1.0)
Monocytes Relative: 10 %
Neutro Abs: 7 10*3/uL (ref 1.7–7.7)
Neutrophils Relative %: 79 %
Platelets: 132 10*3/uL — ABNORMAL LOW (ref 150–400)
RBC: 4.85 MIL/uL (ref 4.22–5.81)
RDW: 14.4 % (ref 11.5–15.5)
WBC: 8.9 10*3/uL (ref 4.0–10.5)
nRBC: 0 % (ref 0.0–0.2)

## 2022-06-20 LAB — URINALYSIS, COMPLETE (UACMP) WITH MICROSCOPIC
Bacteria, UA: NONE SEEN
Bilirubin Urine: NEGATIVE
Glucose, UA: NEGATIVE mg/dL
Hgb urine dipstick: NEGATIVE
Ketones, ur: NEGATIVE mg/dL
Leukocytes,Ua: NEGATIVE
Nitrite: NEGATIVE
Protein, ur: 30 mg/dL — AB
Specific Gravity, Urine: 1.018 (ref 1.005–1.030)
pH: 6 (ref 5.0–8.0)

## 2022-06-20 LAB — COMPREHENSIVE METABOLIC PANEL
ALT: 14 U/L (ref 0–44)
AST: 28 U/L (ref 15–41)
Albumin: 4.5 g/dL (ref 3.5–5.0)
Alkaline Phosphatase: 62 U/L (ref 38–126)
Anion gap: 9 (ref 5–15)
BUN: 24 mg/dL — ABNORMAL HIGH (ref 8–23)
CO2: 26 mmol/L (ref 22–32)
Calcium: 9.2 mg/dL (ref 8.9–10.3)
Chloride: 104 mmol/L (ref 98–111)
Creatinine, Ser: 0.9 mg/dL (ref 0.61–1.24)
GFR, Estimated: >60 mL/min (ref >60)
Glucose, Bld: 116 mg/dL — ABNORMAL HIGH (ref 70–99)
Potassium: 3.9 mmol/L (ref 3.5–5.1)
Sodium: 139 mmol/L (ref 135–145)
Total Bilirubin: 0.9 mg/dL (ref 0.3–1.2)
Total Protein: 7.3 g/dL (ref 6.5–8.1)

## 2022-06-20 LAB — PROCALCITONIN: Procalcitonin: <0.1 ng/mL

## 2022-06-20 LAB — PROTIME-INR
INR: 1.2 (ref 0.8–1.2)
Prothrombin Time: 15.4 seconds — ABNORMAL HIGH (ref 11.4–15.2)

## 2022-06-20 LAB — RESP PANEL BY RT-PCR (RSV, FLU A&B, COVID)  RVPGX2
Influenza A by PCR: NEGATIVE
Influenza B by PCR: NEGATIVE
Resp Syncytial Virus by PCR: NEGATIVE
SARS Coronavirus 2 by RT PCR: POSITIVE — AB

## 2022-06-20 LAB — LACTIC ACID, PLASMA
Lactic Acid, Venous: 2.1 mmol/L (ref 0.5–1.9)
Lactic Acid, Venous: 2.1 mmol/L (ref 0.5–1.9)

## 2022-06-20 LAB — APTT: aPTT: 38 seconds — ABNORMAL HIGH (ref 24–36)

## 2022-06-20 MED ORDER — ACETAMINOPHEN 325 MG PO TABS
650.0000 mg | ORAL_TABLET | Freq: Four times a day (QID) | ORAL | Status: DC | PRN
  Administered 2022-06-21 (×2): 650 mg via ORAL
  Filled 2022-06-20 (×2): qty 2

## 2022-06-20 MED ORDER — HYDROMORPHONE HCL 1 MG/ML IJ SOLN
0.2500 mg | Freq: Once | INTRAMUSCULAR | Status: AC | PRN
  Administered 2022-06-20: 0.25 mg via INTRAVENOUS
  Filled 2022-06-20: qty 0.5

## 2022-06-20 MED ORDER — LACTATED RINGERS IV SOLN: INTRAVENOUS | Status: AC

## 2022-06-20 MED ORDER — DIAZEPAM 5 MG PO TABS
2.5000 mg | ORAL_TABLET | ORAL | Status: DC | PRN
  Administered 2022-06-20 – 2022-06-21 (×4): 2.5 mg via ORAL
  Filled 2022-06-20 (×4): qty 1

## 2022-06-20 MED ORDER — SODIUM CHLORIDE 0.9% FLUSH
3.0000 mL | Freq: Two times a day (BID) | INTRAVENOUS | Status: DC
  Administered 2022-06-20 – 2022-06-22 (×4): 3 mL via INTRAVENOUS

## 2022-06-20 MED ORDER — ONDANSETRON HCL 4 MG/2ML IJ SOLN
4.0000 mg | Freq: Four times a day (QID) | INTRAMUSCULAR | Status: DC | PRN
  Administered 2022-06-21: 4 mg via INTRAVENOUS
  Filled 2022-06-20: qty 2

## 2022-06-20 MED ORDER — ONDANSETRON HCL 4 MG PO TABS: 4.0000 mg | ORAL_TABLET | Freq: Four times a day (QID) | ORAL | Status: DC | PRN

## 2022-06-20 MED ORDER — DIAZEPAM 5 MG PO TABS: 5.0000 mg | ORAL_TABLET | Freq: Every day | ORAL | Status: DC

## 2022-06-20 MED ORDER — POLYETHYLENE GLYCOL 3350 17 G PO PACK: 17.0000 g | PACK | Freq: Every day | ORAL | Status: DC | PRN

## 2022-06-20 MED ORDER — SENNOSIDES-DOCUSATE SODIUM 8.6-50 MG PO TABS
2.0000 | ORAL_TABLET | Freq: Every day | ORAL | Status: DC
  Administered 2022-06-20 – 2022-06-21 (×2): 2 via ORAL
  Filled 2022-06-20 (×2): qty 2

## 2022-06-20 MED ORDER — DIAZEPAM 5 MG PO TABS
2.5000 mg | ORAL_TABLET | Freq: Once | ORAL | Status: AC
  Administered 2022-06-20: 2.5 mg via ORAL
  Filled 2022-06-20: qty 1

## 2022-06-20 MED ORDER — MELATONIN 5 MG PO TABS
5.0000 mg | ORAL_TABLET | Freq: Every day | ORAL | Status: DC
  Administered 2022-06-20 – 2022-06-21 (×2): 5 mg via ORAL
  Filled 2022-06-20 (×2): qty 1

## 2022-06-20 MED ORDER — DONEPEZIL HCL 5 MG PO TABS
10.0000 mg | ORAL_TABLET | Freq: Every day | ORAL | Status: DC
  Administered 2022-06-21 – 2022-06-22 (×2): 10 mg via ORAL
  Filled 2022-06-20 (×2): qty 2

## 2022-06-20 MED ORDER — ACETAMINOPHEN 500 MG PO TABS
1000.0000 mg | ORAL_TABLET | Freq: Once | ORAL | Status: AC
  Administered 2022-06-20: 1000 mg via ORAL
  Filled 2022-06-20: qty 2

## 2022-06-20 MED ORDER — LACTATED RINGERS IV BOLUS
1000.0000 mL | Freq: Once | INTRAVENOUS | Status: AC
  Administered 2022-06-20: 1000 mL via INTRAVENOUS

## 2022-06-20 MED ORDER — MEMANTINE HCL 5 MG PO TABS
10.0000 mg | ORAL_TABLET | Freq: Two times a day (BID) | ORAL | Status: DC
  Administered 2022-06-20 – 2022-06-22 (×4): 10 mg via ORAL
  Filled 2022-06-20 (×4): qty 2

## 2022-06-20 MED ORDER — APIXABAN 5 MG PO TABS
5.0000 mg | ORAL_TABLET | Freq: Two times a day (BID) | ORAL | Status: DC
  Administered 2022-06-20: 5 mg via ORAL
  Filled 2022-06-20: qty 1

## 2022-06-20 NOTE — Assessment & Plan Note (Addendum)
Patient presenting with 1 day history of fever, rigors, generalized weakness and bodyaches.  COVID-19 PCR is positive.  Patient is not experiencing any respiratory symptoms and chest x-ray is clear; some early hypoxia. Due to lack of efficacy and side effect profile, shared decision making with patient's daughter to defer Paxlovid.  - Supportive management with Tylenol as needed for fever and Zofran as needed for nausea - IV fluid resuscitation overnight

## 2022-06-20 NOTE — ED Provider Notes (Signed)
  Consultation with the hospitalist Dr. Charleen Kirks, and hospitalist will accept the patient onto their service   Delman Kitten, MD 06/20/22 [phone]

## 2022-06-20 NOTE — ED Provider Notes (Signed)
[facility] Provider Note    Event Date/Time   First MD Initiated Contact with Patient 06/20/22 1356     (approximate)   History   Fever   HPI  Austin [name] is a 88 y.o. male with past medical history of hypertension Alzheimer's disease prostate cancer who presents with chills and pain.  Patient is unable to provide history.  Per EMS patient was at [facility] where he resides when he suddenly started shaking and was complaining of body pain.  He was febrile to 101 with EMS.  Patient apparently complained of body pain.    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   Myoclonic jerking 04/12/2022   Localized, primary osteoarthritis of shoulder region 02/26/2022   Weakness of left leg 02/26/2022   Gout 03/14/2018   Anxiety 03/14/2018   NSTEMI (non-ST elevated myocardial infarction) ([place]) 12/17/2016   Cervical radiculopathy 11/29/2016   Mood disorder ([place]) 01/12/2016   Coronary atherosclerosis of native coronary artery    LPRD (laryngopharyngeal reflux disease)    History of prostate cancer    Osteoarthritis, generalized    Alzheimer's dementia with behavioral disturbance ([place]) 11/11/2015   Gastro-esophageal reflux disease without esophagitis 08/05/2015   Non-cardiac chest pain 08/05/2015   Essential hypertension 12/15/2012   A-fib ([place]) 12/15/2012   Right bundle branch block 12/15/2012      Physical Exam  Triage Vital Signs: ED Triage Vitals [06/20/22 1401]  Enc Vitals Group     BP (!) 165/88     Pulse Rate (!) 109     Resp      Temp 99.8 F (37.7 C)     Temp Source Oral     SpO2 99 %     Weight      Height      Head Circumference      Peak Flow      Pain Score      Pain Loc      Pain Edu?      Excl. in [place]?     Most recent vital signs: Vitals:   06/20/22 1401 06/20/22 1527  BP: (!) 165/88   Pulse: (!) 109   Resp:  (!) 22  Temp: 99.8 F (37.7 C)   SpO2: 99%      General: Awake, no distress.  CV:  Good peripheral perfusion.  Trace pitting edema in the bilateral lower extremities Resp:  Normal effort.  No increased work of breathing Abd:  Soft, nontender to palpation  Neuro:             Awake, Alert, patient is intermittently singing, very hard of hearing, will not tell me his name and does not know where he is or the year  Other:  Neck is supple No signs of skin or soft tissue infection    ED  Results / Procedures / Treatments  Labs (all labs ordered are listed, but only abnormal results are displayed) Labs Reviewed  RESP PANEL BY RT-PCR (RSV, FLU A&B, COVID)  RVPGX2 - Abnormal; Notable for the following components:      Result Value   SARS Coronavirus 2 by RT PCR POSITIVE (*)    All other components within normal limits  LACTIC ACID, PLASMA - Abnormal; Notable for the following components:   Lactic Acid, Venous 2.1 (*)    All other components within normal limits  COMPREHENSIVE METABOLIC PANEL - Abnormal; Notable for the following components:   Glucose, Bld 116 (*)    BUN 24 (*)    All other components within normal limits  CBC WITH DIFFERENTIAL/PLATELET - Abnormal; Notable for the following components:   Platelets 132 (*)    All other components within normal limits  PROTIME-INR - Abnormal; Notable for the following components:   Prothrombin Time 15.4 (*)    All other components within normal limits  APTT - Abnormal; Notable for the  following components:   aPTT 38 (*)    All other components within normal limits  URINALYSIS, COMPLETE (UACMP) WITH MICROSCOPIC - Abnormal; Notable for the following components:   Color, Urine YELLOW (*)    APPearance CLEAR (*)    Protein, ur 30 (*)    All other components within normal limits  CULTURE, BLOOD (ROUTINE X 2)  CULTURE, BLOOD (ROUTINE X 2)  URINE CULTURE  PROCALCITONIN  LACTIC ACID, PLASMA     EKG  EKG reviewed and interpreted by myself shows sinus tach, RBBB, LAFB, PVCs    RADIOLOGY I reviewed and interpreted the CT scan of the brain which does not show any acute intracranial process    PROCEDURES:  Critical Care performed: Yes, see critical care procedure note(s)  .1-3 Lead EKG Interpretation  Performed by: Rada Hay, MD Authorized by: Rada Hay, MD     Interpretation: abnormal     ECG rate assessment: tachycardic     Rhythm: sinus tachycardia     Ectopy: none     Conduction: normal     The patient is on the cardiac monitor to evaluate for evidence of arrhythmia and/or significant heart rate changes.   MEDICATIONS ORDERED IN ED: Medications  acetaminophen (TYLENOL) tablet 1,000 mg (has no administration in time range)  lactated ringers bolus 1,000 mL (1,000 mLs Intravenous New Bag/Given 06/20/22 1508)     IMPRESSION / MDM / ASSESSMENT AND PLAN / ED COURSE  I reviewed the triage vital signs and the nursing notes.                              Patient's presentation is most consistent with acute presentation with potential threat to life or bodily function.  Differential diagnosis includes, but is not limited to, sepsis 2/2 UTI, PNA, viral illness, encephalitis/meningitis, intracranial hemorrhage  The patient is an 88 year old male with history of underlying dementia who presents because chills and complaining of pain.  I was not around when EMS  but apparently patient started having chills this afternoon and was complaining of  pain.  On arrival patient is [place] ring he is tachycardic and tachypneic but is not hypoxic oral temp 99.8.  Blood pressure is actually high.  He really cannot provide much in the way of history but he does not have any findings to suggest cellulitis or skin infection abdomen is soft nontender  his neck is supple.  He moves all of his extremities.  Plan to workup broadly for infection and will obtain CT head.  Patient's lactate is elevated at 2.1 he has no leukocytosis negative procalcitonin UA is clean he is COVID-positive which I think likely explains his symptoms.  Will give a bolus of fluid.  Chest x-ray is clear and CT head is normal.  Daughter is now at bedside.  Tells me that he is not typically oriented but does walk with a walker has good days and bad days.  Currently his migraine is very unusual for him and he is more confused than normal.  She does not feel that he will be able to go back to the facility.  At this point given we have not found a bacterial focus of infection and the COVID will defer antibiotics.  Blood cultures have been sent.  Will discuss with the hospitalist for admission.   Clinical Course as of 06/20/22 1532  Wed Jun 20, 2022  1527 Procalcitonin: <0.10 [KM]    Clinical Course User Index [KM] Rada Hay, MD     FINAL CLINICAL IMPRESSION(S) / ED DIAGNOSES   Final diagnoses:  [id number]     Rx / DC Orders   ED Discharge Orders     None        Note:  This document was prepared using Dragon voice recognition software and may include unintentional dictation errors.   Rada Hay, MD 06/20/22 [phone]

## 2022-06-20 NOTE — Assessment & Plan Note (Signed)
Likely multifactorial, acutely exacerbated in the setting of COVID-19 infection.  -PT/OT

## 2022-06-20 NOTE — Assessment & Plan Note (Signed)
On examination, patient is pleasantly confused and appears at baseline.  -Continue home Valium, Aricept and Namenda

## 2022-06-20 NOTE — H&P (Signed)
History and Physical    Patient: Fredick Schlosser BSJ:[id number] DOB: [birth year] DOA: 06/20/2022 DOS: the patient was seen and examined on 06/20/2022 PCP: Dewayne Shorter, MD  Patient coming from: ALF/ILF  Chief Complaint:  Chief Complaint  Patient presents with   Fever   HPI: Edith Lord is a 88 y.o. male with medical history significant of Alzheimer's dementia, atrial fibrillation on Eliquis, CAD, prostate cancer, who presents to the ED due to fever and altered mental status.  History obtained from patient's daughter at bedside given patient's severe dementia.  Mr. Cherlyn Cushing daughter states that earlier this morning, patient had sudden onset rigors and chest pain.  In addition, he seemed more weak than usual and endorsed body aches.  Due to this, EMS was called.  Patient's daughters did not notice any shortness of breath, cough, vomiting, diarrhea.  At this time, Mr. Cherlyn Cushing denies any pain or discomfort.  He states he feels well.  ED course: On arrival to the ED, patient was hypertensive at 165/88, with heart rate of 112.  He was saturating at 95% on room air.  He was initially afebrile at 99.8 with subsequent increase in temperature to 100.7.  Initial blood work remarkable for positive COVID-19 PCR, glucose 116, and lactic acid of 2.1.  Procalcitonin was negative.  Due to significant generalized weakness in the setting of COVID-19 infection, [facility] contacted for admission.  Review of Systems: unable to review all systems due to the inability of the patient to answer questions.  Past Medical History:  Diagnosis Date   Alzheimer's dementia ([place])    ALZHEIMERS   Atrial fibrillation ([place])    REVEAL DEVICE MEDTRONIC SERIAL #[id number] S MODEL #[id number]   Coronary atherosclerosis of native coronary artery    MI 2014   GERD (gastroesophageal reflux disease)    REFLUX   Gout    History of prostate cancer    HTN (hypertension)    LPRD (laryngopharyngeal reflux disease)    Osteoarthritis,  generalized    DJD neck and spine   RBBB    RBBB (right bundle branch block)    Past Surgical History:  Procedure Laterality Date   ABDOMINAL ADHESION SURGERY     APPENDECTOMY     DIRECT LARYNGOSCOPY Left 04/19/2016   Procedure: DIRECT MICROSCOPIC LARYNGOSCOPY WITH BIOPSY;  Surgeon: Margaretha Sheffield, MD;  Location: [facility];  Service: ENT;  Laterality: Left;   EP IMPLANTABLE DEVICE     Reveal device Medtronic Serial #[id number]  Model #[id number]   INGUINAL HERNIA REPAIR Left    KNEE SURGERY     PROSTATECTOMY     SHOULDER ARTHROSCOPY     TREATMENT FISTULA ANAL     Social History:  reports that he has never smoked. He has never used smokeless tobacco. He reports that he does not drink alcohol and does not use drugs.  Allergies  Allergen Reactions   Amiodarone Other (See Comments)    Trouble waking/falls   Multaq [Dronedarone] Other (See Comments)    unknown   Oxycodone    Albuterol Palpitations    Family History  Problem Relation Age of Onset   Hypertension Mother    Chronic Renal Failure Mother    Cancer Neg Hx    Heart disease Neg Hx    Diabetes Neg Hx    Prostate cancer Neg Hx     Prior to Admission medications   Medication Sig Start Date End Date Taking? Authorizing Provider  acetaminophen (TYLENOL) 500 MG tablet Take 1,000 mg by mouth 2 (  two) times daily.    [provider]  amoxicillin (AMOXIL) 500 MG capsule Take 2,000 mg by mouth daily as needed. Give 1 hour prior to dental procedure    [provider]  apixaban (ELIQUIS) 5 MG TABS tablet Take 1 tablet (5 mg total) by mouth 2 (two) times daily. 02/22/16   Wende Bushy, MD  bisacodyl (DULCOLAX) 10 MG suppository Place 10 mg rectally daily as needed for moderate constipation.    [provider]  diazepam (VALIUM) 5 MG tablet Take 1 tablet (5 mg total) by mouth daily. In the afternoon, scheduled, and 1/2 tablet (2.5 mg) every 4 hours as needed for anxiety related pain 05/03/22   Lauree Chandler, NP  diclofenac Sodium (VOLTAREN) 1 % GEL Apply 2 g topically 4 (four) times daily.    [provider]  donepezil (ARICEPT) 10 MG tablet Take 10 mg by mouth daily.    [provider]  melatonin 3 MG TABS tablet Take 3 mg by mouth at bedtime.    [provider]  memantine (NAMENDA) 10 MG tablet Take 10 mg by mouth 2 (two) times daily.    [provider]  Multiple Vitamins-Minerals (THERAGRAN-M PO) Take 1 tablet by mouth daily.    [provider]  polyethylene glycol (MIRALAX / GLYCOLAX) 17 g packet Take 17 g by mouth every other day. Scheduled and 1 scoop as needed for constipation daily in liquid of choice    [provider]  senna-docusate (SENOKOT-S) 8.6-50 MG tablet Take 2 tablets by mouth at bedtime.    [provider]    Physical Exam: Vitals:   06/20/22 1700 06/20/22 1730 06/20/22 1800 06/20/22 1830  BP: 122/72 116/66 114/70 111/77  Pulse: (!) 101 99  90  Resp:      Temp:      TempSrc:      SpO2: 96% 100%  91%  Weight:      Height:       Physical Exam Vitals and nursing note reviewed.  Constitutional:      General: He is not in acute distress.    Appearance: He is normal weight. He is not toxic-appearing.  HENT:     Head: Normocephalic and atraumatic.     Mouth/Throat:     Mouth: Mucous membranes are dry.  Eyes:     Conjunctiva/sclera: Conjunctivae normal.     Pupils: Pupils are equal, round, and reactive to light.  Cardiovascular:     Rate and Rhythm: Normal rate and regular rhythm.     Heart sounds: No murmur heard.    No gallop.  Pulmonary:     Effort: Pulmonary effort is normal. No respiratory distress.     Breath sounds: Normal breath sounds. No wheezing, rhonchi or rales.  Abdominal:     General: Bowel sounds are normal. There is no distension.     Palpations: Abdomen is soft.     Tenderness: There is no abdominal tenderness. There is no guarding.  Musculoskeletal:     Right lower leg:  No edema.     Left lower leg: No edema.  Skin:    General: Skin is warm and dry.  Neurological:     Mental Status: He is alert.     Comments:  Patient is alert and oriented to self only. Bilateral upper extremities and lower extremities 2-3/5 strength globally.  No focal weakness noted. No cranial nerve deficits noted nor dysarthria.  Psychiatric:     Comments: Patient  is pleasantly confused and responds to questions by singing.  He is unable to answer questions nor follow commands without frequent redirection or instruction.    Data Reviewed: CBC with WBC of 8.9, hemoglobin of 14.7, platelets of 132. CMP with potassium of 3.9, bicarb of 26, glucose of 116, BUN of 24, creatinine 0.90, and normal LFTs. PT mildly prolonged at 15.4 and PTT mildly prolonged at 38. Procalcitonin negative COVID-19 PCR positive.  Influenza PCR and RSV PCR negative. Lactic acid mildly elevated 2.1 with flat trend. Urinalysis with mild proteinuria, 6-10 RBCs/hpf and no other abnormalities.  EKG personally reviewed.  Sinus rhythm with rate of 105.  A few PVCs noted noted.  Right bundle branch block.  CT HEAD WO CONTRAST (5MM)  Result Date: 06/20/2022 CLINICAL DATA:  Mental status change, unknown cause. Altered mental status. History of dementia. EXAM: CT HEAD WITHOUT CONTRAST TECHNIQUE: Contiguous axial images were obtained from the base of the skull through the vertex without intravenous contrast. RADIATION DOSE REDUCTION: This exam was performed according to the departmental dose-optimization program which includes automated exposure control, adjustment of the mA and/or kV according to patient size and/or use of iterative reconstruction technique. COMPARISON:  Head CT 10/13/2019. Brain MRI 08/07/2016. FINDINGS: Despite repeat scanning and despite the technologist's best efforts, the examination is significantly motion degraded and this limits evaluation. Within this limitation, findings are as follows. Brain:  Moderate cerebral atrophy. Commensurate prominence of the ventricles and sulci. Moderate patchy and ill-defined hypoattenuation within the cerebral white matter, nonspecific but compatible with chronic small vessel disease. No appreciable acute intracranial hemorrhage, acute infarct, intracranial mass or extra-axial fluid collection. No midline shift. Vascular: No hyperdense vessel. Atherosclerotic calcifications. Skull: No fracture or aggressive osseous lesion. Sinuses/Orbits: No mass or acute finding within the imaged orbits. Small mucous retention cyst within the left sphenoid sinus IMPRESSION: 1. The examination is significantly motion degraded, limiting evaluation. 2. Within this limitation, there is no appreciable acute intracranial abnormality 3. Moderate chronic small ischemic changes within the cerebral white matter. 4. Moderate cerebral atrophy. Electronically Signed   By: Kellie Simmering D.O.   On: 06/20/2022 15:08   DG Chest Port 1 View  Result Date: 06/20/2022 CLINICAL DATA:  Questionable sepsis. EXAM: PORTABLE CHEST 1 VIEW COMPARISON:  10/13/2019 FINDINGS: Low volume film. The cardio pericardial silhouette is enlarged. Streaky opacity at the left base is similar to prior compatible with chronic atelectasis or scarring. No edema or focal airspace consolidation. No substantial pleural effusion. Status post left shoulder replacement. Telemetry leads overlie the chest. IMPRESSION: Low volume film with chronic left base atelectasis or scarring. Electronically Signed   By: Misty Stanley M.D.   On: 06/20/2022 14:50    There are no new results to review at this time.  Assessment and Plan: * COVID-19 virus infection Patient presenting with 1 day history of fever, rigors, generalized weakness and bodyaches.  COVID-19 PCR is positive.  Patient is not experiencing any respiratory symptoms and chest x-ray is clear; no evidence of hypoxia.  Due to this, no indication for remdesivir.  Due to lack of efficacy  and side effect profile, shared decision making with patient's daughter to defer Paxlovid.  - Supportive management with Tylenol as needed for fever and Zofran as needed for nausea - IV fluid resuscitation overnight  Generalized weakness Likely multifactorial, acutely exacerbated in the setting of COVID-19 infection.  -PT/OT  Alzheimer's dementia with behavioral disturbance ([place]) On examination, patient is pleasantly confused and appears at baseline.  -Continue  home Valium, Aricept and Namenda  A-fib ([place]) Sinus rhythm on EKG today.  - Continue home apixaban  Advance Care Planning:   Code Status: Full Code.  Discussed with patient's 2 daughters at bedside.  They state that family has not been able to meet up to come to a unified decision regarding CODE STATUS.  They both do not feel patient would do well with resuscitative efforts, but would like to discuss and come up with a plan prior to changing CODE STATUS at this time.  Remains full code.  Consults: None  Family Communication: Patient's daughters updated at bedside  Severity of Illness: The appropriate patient status for this patient is OBSERVATION. Observation status is judged to be reasonable and necessary in order to provide the required intensity of service to ensure the patient's safety. The patient's presenting symptoms, physical exam findings, and initial radiographic and laboratory data in the context of their medical condition is felt to place them at decreased risk for further clinical deterioration. Furthermore, it is anticipated that the patient will be medically stable for discharge from the hospital within 2 midnights of admission.   Author: Jose Persia, MD 06/20/2022 6:49 PM  For on call review [email or web address].

## 2022-06-20 NOTE — Assessment & Plan Note (Signed)
Sinus rhythm on EKG today.  - Continue home apixaban

## 2022-06-21 ENCOUNTER — Encounter: Payer: Self-pay | Admitting: Internal Medicine

## 2022-06-21 ENCOUNTER — Other Ambulatory Visit: Payer: Self-pay

## 2022-06-21 DIAGNOSIS — U071 COVID-19: Secondary | ICD-10-CM | POA: Diagnosis not present

## 2022-06-21 LAB — CBC WITH DIFFERENTIAL/PLATELET
Abs Immature Granulocytes: 0.02 10*3/uL (ref 0.00–0.07)
Basophils Absolute: 0 10*3/uL (ref 0.0–0.1)
Basophils Relative: 0 %
Eosinophils Absolute: 0 10*3/uL (ref 0.0–0.5)
Eosinophils Relative: 0 %
HCT: 37.4 % — ABNORMAL LOW (ref 39.0–52.0)
Hemoglobin: 12.3 g/dL — ABNORMAL LOW (ref 13.0–17.0)
Immature Granulocytes: 0 %
Lymphocytes Relative: 12 %
Lymphs Abs: 0.8 10*3/uL (ref 0.7–4.0)
MCH: 30.8 pg (ref 26.0–34.0)
MCHC: 32.9 g/dL (ref 30.0–36.0)
MCV: 93.5 fL (ref 80.0–100.0)
Monocytes Absolute: 0.9 10*3/uL (ref 0.1–1.0)
Monocytes Relative: 13 %
Neutro Abs: 5.3 10*3/uL (ref 1.7–7.7)
Neutrophils Relative %: 75 %
Platelets: 101 10*3/uL — ABNORMAL LOW (ref 150–400)
RBC: 4 MIL/uL — ABNORMAL LOW (ref 4.22–5.81)
RDW: 14.5 % (ref 11.5–15.5)
WBC: 7.1 10*3/uL (ref 4.0–10.5)
nRBC: 0 % (ref 0.0–0.2)

## 2022-06-21 LAB — COMPREHENSIVE METABOLIC PANEL
ALT: 13 U/L (ref 0–44)
AST: 25 U/L (ref 15–41)
Albumin: 3.6 g/dL (ref 3.5–5.0)
Alkaline Phosphatase: 47 U/L (ref 38–126)
Anion gap: 6 (ref 5–15)
BUN: 16 mg/dL (ref 8–23)
CO2: 26 mmol/L (ref 22–32)
Calcium: 8.3 mg/dL — ABNORMAL LOW (ref 8.9–10.3)
Chloride: 106 mmol/L (ref 98–111)
Creatinine, Ser: 0.81 mg/dL (ref 0.61–1.24)
GFR, Estimated: >60 mL/min (ref >60)
Glucose, Bld: 104 mg/dL — ABNORMAL HIGH (ref 70–99)
Potassium: 3.5 mmol/L (ref 3.5–5.1)
Sodium: 138 mmol/L (ref 135–145)
Total Bilirubin: 0.9 mg/dL (ref 0.3–1.2)
Total Protein: 6.1 g/dL — ABNORMAL LOW (ref 6.5–8.1)

## 2022-06-21 LAB — C-REACTIVE PROTEIN: CRP: 4.9 mg/dL — ABNORMAL HIGH (ref <1.0)

## 2022-06-21 MED ORDER — VITAMIN C 500 MG PO TABS
500.0000 mg | ORAL_TABLET | Freq: Two times a day (BID) | ORAL | Status: DC
  Administered 2022-06-21 – 2022-06-22 (×2): 500 mg via ORAL
  Filled 2022-06-21 (×2): qty 1

## 2022-06-21 MED ORDER — HYDROMORPHONE HCL 1 MG/ML IJ SOLN
0.2500 mg | Freq: Once | INTRAMUSCULAR | Status: AC | PRN
  Administered 2022-06-21: 0.25 mg via INTRAVENOUS
  Filled 2022-06-21: qty 0.5

## 2022-06-21 MED ORDER — SODIUM CHLORIDE 0.9 % IV SOLN
100.0000 mg | Freq: Every day | INTRAVENOUS | Status: DC
  Filled 2022-06-21: qty 20

## 2022-06-21 MED ORDER — SODIUM CHLORIDE 0.9 % IV SOLN: INTRAVENOUS | Status: AC

## 2022-06-21 MED ORDER — SODIUM CHLORIDE 0.9 % IV SOLN
200.0000 mg | Freq: Once | INTRAVENOUS | Status: DC
  Filled 2022-06-21: qty 40

## 2022-06-21 MED ORDER — ZINC SULFATE 220 (50 ZN) MG PO CAPS
220.0000 mg | ORAL_CAPSULE | Freq: Every day | ORAL | Status: DC
  Administered 2022-06-21 – 2022-06-22 (×2): 220 mg via ORAL
  Filled 2022-06-21 (×2): qty 1

## 2022-06-21 MED ORDER — METHYLPREDNISOLONE SODIUM SUCC 125 MG IJ SOLR
80.0000 mg | INTRAMUSCULAR | Status: DC
  Administered 2022-06-21: 80 mg via INTRAVENOUS
  Filled 2022-06-21: qty 2

## 2022-06-21 NOTE — ED Notes (Signed)
Pt. Is sitting up in stretcher, conversation and hard of hearing. Pt. Has no complaints currently. Purewick remains intact and functional. Pt's urine is blood red, and has been overnight according to PM RN. Dr. Tawanna Solo notified.

## 2022-06-21 NOTE — Evaluation (Signed)
Physical Therapy Evaluation Patient Details Name: Austin [name] MRN: [id number] DOB: [birth year] Today's Date: 06/21/2022  History of Present Illness  88 y.o. male with medical history significant of Alzheimer's dementia, atrial fibrillation on Eliquis, CAD, prostate cancer, who presents to the ED due to fever and altered mental status.  Found to be Covid +.  Clinical Impression  Pt very confused but with gentle encouragement he was able to show some effort with PT.  Per daughter he often needs supervision but is able to do bed mobility and some walking on his memory care unit.  Today he did not tolerate much and despite 2 standing at EOB attempts he could not get to fully upright and he was clearly anxious about trying.  Pt is far from his baseline, will benefit from continued PT to address functional mobility and safety issues.      Recommendations for follow up therapy are one component of a multi-disciplinary discharge planning process, led by the attending physician.  Recommendations may be updated based on patient status, additional functional criteria and insurance authorization.  Follow Up Recommendations Skilled nursing-short term rehab (<3 hours/day) Can patient physically be transported by private vehicle: No    Assistance Recommended at Discharge Frequent or constant Supervision/Assistance  Patient can return home with the following  Two people to help with walking and/or transfers;A lot of help with bathing/dressing/bathroom;Assistance with cooking/housework;Assistance with feeding;Assist for transportation;Direct supervision/assist for medications management    Equipment Recommendations None recommended by PT  Recommendations for Other Services       Functional Status Assessment Patient has had a recent decline in their functional status and demonstrates the ability to make significant improvements in function in a reasonable and predictable amount of time.     Precautions /  Restrictions Precautions Precautions: Fall Restrictions Weight Bearing Restrictions: No      Mobility  Bed Mobility Overal bed mobility: Needs Assistance Bed Mobility: Supine to Sit, Sit to Supine     Supine to sit: Mod assist Sit to supine: Mod assist, Max assist   General bed mobility comments: Per daughter pt is normally able to get up sitting w/o assist, today confusion may have been a factor but he needed heavy assist just to initiate move toward EOB and then ultimately needing mod assist to get to and try to maintain EOB sitting.    Transfers Overall transfer level: Needs assistance Equipment used: Rolling walker (2 wheels) Transfers: Sit to/from Stand Sit to Stand: Max assist           General transfer comment: 2 standing trails at EOB (elevated ED gurney) even with max assist to shift hips forward into the walker and plenty of cuing from PT and daughter he could not get up into the walker and take weight effectively through LEs.  Pt clearly anxious about standing but did appear to want to, simply too weak to actually attain upright    Ambulation/Gait               General Gait Details: unable  Stairs            Wheelchair Mobility    Modified Rankin (Stroke Patients Only)       Balance Overall balance assessment: Needs assistance Sitting-balance support: Bilateral upper extremity supported Sitting balance-Leahy Scale: Poor     Standing balance support: Bilateral upper extremity supported Standing balance-Leahy Scale: Zero  Pertinent Vitals/Pain Pain Assessment Pain Assessment: No/denies pain    Home Living Family/patient expects to be discharged to:: Assisted living                 Home Equipment: Conservation officer, nature (2 wheels) Additional Comments: memory care unit    Prior Function Prior Level of Function : Needs assist  Cognitive Assist : Mobility (cognitive);ADLs (cognitive)            Mobility Comments: per daughter, with walker he is able to walk from room to dining area and be up ad lib for limited distances ADLs Comments: staff assist with bathing, clean up     Hand Dominance        Extremity/Trunk Assessment   Upper Extremity Assessment Upper Extremity Assessment: Generalized weakness    Lower Extremity Assessment Lower Extremity Assessment: Generalized weakness       Communication   Communication: Expressive difficulties  Cognition Arousal/Alertness: Awake/alert Behavior During Therapy: Restless Overall Cognitive Status: History of cognitive impairments - at baseline                                 General Comments: Pt could not recognize daughter in room (per family this is baseline) able to state first name only, unaware of situation, date, etc        General Comments General comments (skin integrity, edema, etc.): Pt very confused, per family probably more than baseline.  He was ostensibly willing to try to get up and do some activity but could not get to standing and did show some anxious agitation (unfamiliar environment?) along with general weakness that did not allow standing Pt's O2 in the mid/low 90s on room air t/o the session.     Exercises     Assessment/Plan    PT Assessment    PT Problem List         PT Treatment Interventions      PT Goals (Current goals can be found in the Care Plan section)  Acute Rehab PT Goals Patient Stated Goal: per daughter, be back to walking at his ALF PT Goal Formulation: With patient Time For Goal Achievement: 07/04/22 Potential to Achieve Goals: Fair    Frequency Min 2X/week     Co-evaluation               AM-PAC PT "6 Clicks" Mobility  Outcome Measure Help needed turning from your back to your side while in a flat bed without using bedrails?: A Lot Help needed moving from lying on your back to sitting on the side of a flat bed without using bedrails?: A  Lot Help needed moving to and from a bed to a chair (including a wheelchair)?: Total Help needed standing up from a chair using your arms (e.g., wheelchair or bedside chair)?: Total Help needed to walk in hospital room?: Total Help needed climbing 3-5 steps with a railing? : Total 6 Click Score: 8    End of Session Equipment Utilized During Treatment: Gait belt Activity Tolerance: Patient limited by fatigue Patient left: with bed alarm set;with call bell/phone within reach;with family/visitor present Nurse Communication: Mobility status PT Visit Diagnosis: Muscle weakness (generalized) (M62.81);Difficulty in walking, not elsewhere classified (R26.2);Unsteadiness on feet (R26.81)    Time: 2353-6144 PT Time Calculation (min) (ACUTE ONLY): 19 min   Charges:   PT Evaluation $PT Eval Low Complexity: 1 Low PT Treatments $Therapeutic Activity: 8-22 mins  Kreg Shropshire, DPT 06/21/2022, 11:47 AM

## 2022-06-21 NOTE — ED Notes (Signed)
Notified IP NP Randol Kern requesting pain medication.

## 2022-06-21 NOTE — Progress Notes (Signed)
PHARMACIST - PHYSICIAN COMMUNICATION   CONCERNING: Methylprednisolone IV    Current order: Methylprednisolone IV '40mg'$  IV q 12hr     DESCRIPTION: Per [facility] Protocol:   IV methylprednisolone will be converted to either a q12h or q24h frequency with the same total daily dose (TDD).  Ordered Dose: 1 to 125 mg TDD; convert to: TDD q24h.  Ordered Dose: 126 to 250 mg TDD; convert to: TDD div q12h.  Ordered Dose: >250 mg TDD; DAW.  Order has been adjusted to: Methylprednisolone IV 80 mg q 24h  Jonh Mcqueary Rodriguez-Guzman PharmD, BCPS 06/21/2022 5:52 PM

## 2022-06-21 NOTE — Consult Note (Signed)
Remdesivir - Pharmacy Brief Note   O:  ALT: 13 CXR: Low volume film with chronic left base atelectasis or scarring.  SpO2: 92% on 2L   A/P:  Remdesivir 200 mg IVPB once followed by 100 mg IVPB daily x 2 days.   Lorin Picket, PharmD 06/21/2022 6:00 PM

## 2022-06-21 NOTE — Evaluation (Signed)
Occupational Therapy Evaluation Patient Details Name: Austin [name] MRN: [id number] DOB: 21-Jul-1930 Today's Date: 06/21/2022   History of Present Illness 88 y.o. male with medical history significant of Alzheimer's dementia, atrial fibrillation on Eliquis, CAD, prostate cancer, who presents to the ED due to fever and altered mental status.  Found to be Covid +.   Clinical Impression   Patient presenting with decreased Ind in self care, balance, functional mobility/transfers, endurance, and safety awareness. Patient is oriented to self only. Per chart review and staff report, pt lives in a memory care unit where he normally ambulates with supervision and has assistance for self care tasks. Pt is very resistive towards movement this session and unable to follow commands. Therapist assisting pt towards [facility] and then pt puts himself back onto ED stretcher. Total A to don B shoes this session. OT attempting to assist pt with bed mobility and he reached out and grabbed onto therapist arms and restricted therapist's ability to reposition him. Pending progress with therapy sessions pt may benefit from SNF to address functional deficits as he is far from baseline. Patient will benefit from acute OT to increase overall independence in the areas of ADLs, functional mobility, and safety awareness in order to safely discharge to next venue of care.      Recommendations for follow up therapy are one component of a multi-disciplinary discharge planning process, led by the attending physician.  Recommendations may be updated based on patient status, additional functional criteria and insurance authorization.   Follow Up Recommendations  Skilled nursing-short term rehab (<3 hours/day)     Assistance Recommended at Discharge Frequent or constant Supervision/Assistance     Functional Status Assessment  Patient has had a recent decline in their functional status and demonstrates the ability to make significant  improvements in function in a reasonable and predictable amount of time.  Equipment Recommendations  Other (comment) (defer to next venue of care)       Precautions / Restrictions Precautions Precautions: Fall Restrictions Weight Bearing Restrictions: No      Mobility Bed Mobility Overal bed mobility: Needs Assistance Bed Mobility: Rolling Rolling: Max assist         General bed mobility comments: Pt is very resistive with bed mobility. If therapist assisted pt with moving LEs towards EOB he would return them back to bed.    Transfers                              ADL either performed or assessed with clinical judgement   ADL Overall ADL's : Needs assistance/impaired                       Lower Body Dressing Details (indicate cue type and reason): total A to don B shoes                     Vision Patient Visual Report: No change from baseline              Pertinent Vitals/Pain Pain Assessment Pain Assessment: Faces Pain Score: 0-No pain        Extremity/Trunk Assessment Upper Extremity Assessment Upper Extremity Assessment: Generalized weakness   Lower Extremity Assessment Lower Extremity Assessment: Generalized weakness       Communication Communication Communication: Expressive difficulties   Cognition Arousal/Alertness: Awake/alert Behavior During Therapy: Flat affect Overall Cognitive Status: History of cognitive impairments - at baseline  General Comments: Pt resistive to movement and oriented to name only                Home Living Family/patient expects to be discharged to:: Assisted living                             Home Equipment: Conservation officer, nature (2 wheels)   Additional Comments: memory care unit      Prior Functioning/Environment Prior Level of Function : Needs assist             Mobility Comments: per daughter, with walker he is able  to walk from room to dining area and be up ad lib for limited distances ADLs Comments: staff assist with bathing, clean up.        OT Problem List: Decreased strength;Decreased activity tolerance;Decreased safety awareness;Impaired balance (sitting and/or standing);Decreased knowledge of use of DME or AE      OT Treatment/Interventions: Self-care/ADL training;Therapeutic exercise;DME and/or AE instruction;Balance training;Patient/family education;Therapeutic activities    OT Goals(Current goals can be found in the care plan section) Acute Rehab OT Goals Patient Stated Goal: none stated OT Goal Formulation: Patient unable to participate in goal setting Time For Goal Achievement: 07/05/22 Potential to Achieve Goals: Fair ADL Goals Pt Will Perform Grooming: with supervision;standing Pt Will Transfer to Toilet: with supervision;ambulating Pt Will Perform Toileting - Clothing Manipulation and hygiene: with supervision;sit to/from stand  OT Frequency: Min 2X/week       AM-PAC OT "6 Clicks" Daily Activity     Outcome Measure Help from another person eating meals?: A Little Help from another person taking care of personal grooming?: A Little Help from another person toileting, which includes using toliet, bedpan, or urinal?: A Lot Help from another person bathing (including washing, rinsing, drying)?: A Lot Help from another person to put on and taking off regular upper body clothing?: A Little Help from another person to put on and taking off regular lower body clothing?: A Lot 6 Click Score: 15   End of Session Nurse Communication: Mobility status  Activity Tolerance: Other (comment) (limited by cognition) Patient left: in bed;with call bell/phone within reach;with bed alarm set  OT Visit Diagnosis: Unsteadiness on feet (R26.81);Muscle weakness (generalized) (M62.81)                Time: 9563-8756 OT Time Calculation (min): 22 min Charges:  OT General Charges $OT Visit: 1  Visit OT Evaluation $OT Eval Moderate Complexity: 1 Mod OT Treatments $Therapeutic Activity: 8-22 mins  Darleen Crocker, MS, OTR/L , CBIS ascom [phone]  06/21/22, 3:20 PM

## 2022-06-21 NOTE — Progress Notes (Signed)
PROGRESS NOTE  Austin [name]  LOV:[id number] DOB: 1931/01/01 DOA: 06/20/2022 PCP: Dewayne Shorter, MD   Brief Narrative: Patient is a 88 year old male with history of Alzheimer's dementia, A-fib on Eliquis, coronary artery disease, prostate cancer who presented to the emergency department with complaints of fever, altered mental status from memory care at [facility].  He was found to be more weak than usual, complains of bodyaches so EMS was called.  On presentation he was mildly hypertensive, and in sinus tachycardia.  Saturating fine on room air.  Had mild fever.  COVID PCR was positive.  Lab work showed lactate of 2.1.  Patient was admitted for further management of COVID illness with generalized weakness, altered mental status.  Assessment & Plan:  Principal Problem:   COVID-19 virus infection Active Problems:   Generalized weakness   Alzheimer's dementia with behavioral disturbance ([place])   A-fib ([place])   COVID infection: Presented with 1 day history of fever, rigors, generalized weakness, body aches.  COVID screen test positive.  No respiratory symptoms.  Chest x-ray did not show pneumonia.  No hypoxia.  Not started on any antivirals or steroids.  Continue supportive care.  Started on zinc and vitamin C.  Monitor inflammatory markers, CRP slightly elevated at 4.9. If he becomes hypoxic or develops evidence of pneumonia, will start on steroids/remdesivir  Generalized weakness: Secondary to COVID illness.  PT/OT consulted, recommended SNF.  Patient is from memory care at [facility]  Elevated lactate: Most likely from dehydration, no suspicion for septic physiology.  Will start on gentle IV fluids  Alzheimer's dementia with behavioral disturbance: Pleasantly confused.  Confused at baseline.  On Valium, Aricept, Namenda at home.  Paroxysmal A-fib: Currently in normal sinus rhythm.  On Eliquis for anticoagulation,now on hold.  Not on any rate control medications at home.  Hematuria: UA  on presentation showed clear urine with minimal RBCs.  In and out cath was done in the emergency department by the nursing staff after that he started having gross hematuria.  Case discussed with Dr. Diamantina Providence, urology who recommended holding Eliquis.  He has history of prostatectomy.  Urology recommended minimizing In-N-Out cath/Foley cath.  Currently he is voiding fine on condom catheter       DVT prophylaxis: apixaban (ELIQUIS) tablet 5 mg     Code Status: Full Code  Family Communication: Called and discussed with daughter Santiago Glad on phone on 12/21  Patient status:Obs  Patient is from :memory care  Anticipated discharge to:SNF  Estimated DC date:2-3 days   Consultants: None  Procedures:None  Antimicrobials:  Anti-infectives (From admission, onward)    None       Subjective: Patient seen and examined at bedside this morning.  Hemodynamically stable.  Sitting in bed.  Alert and awake but confused.  On room air.  Not in any kind of respite distress.  Urine canister shows gross bloody urine.  Confused so could not gather further information  Objective: Vitals:   06/21/22 0410 06/21/22 0420 06/21/22 0700 06/21/22 0809  BP: 128/70  123/61 (!) 100/58  Pulse: 79  84 76  Resp: 20  (!) 21 20  Temp:  (!) 100.6 F (38.1 C)  98.4 F (36.9 C)  TempSrc:  Axillary  Oral  SpO2: 93%  95% 93%  Weight:      Height:        Intake/Output Summary (Last 24 hours) at 06/21/2022 0817 Last data filed at 06/21/2022 0816 Gross per 24 hour  Intake 1303 ml  Output 500 ml  Net 803 ml   Filed Weights   06/20/22 1417  Weight: 70.7 kg    Examination:  General exam: Overall comfortable, not in distress, pleasantly confused elderly gentleman HEENT: PERRL Respiratory system:  no wheezes or crackles  Cardiovascular system: S1 & S2 heard, RRR.  Gastrointestinal system: Abdomen is nondistended, soft and nontender. Central nervous system: Alert and awake but not oriented Extremities: No  edema, no clubbing ,no cyanosis Skin: No rashes, no ulcers,no icterus  GU: Condom cath with bloody urine in the canister   Data Reviewed: I have personally reviewed following labs and imaging studies  CBC: Recent Labs  Lab 06/20/22 1403 06/21/22 0409  WBC 8.9 7.1  NEUTROABS 7.0 5.3  HGB 14.7 12.3*  HCT 45.3 37.4*  MCV 93.4 93.5  PLT 132* 932*   Basic Metabolic Panel: Recent Labs  Lab 06/20/22 1403 06/21/22 0409  NA 139 138  K 3.9 3.5  CL 104 106  CO2 26 26  GLUCOSE 116* 104*  BUN 24* 16  CREATININE 0.90 0.81  CALCIUM 9.2 8.3*     Recent Results (from the past 240 hour(s))  Blood Culture (routine x 2)     Status: None (Preliminary result)   Collection Time: 06/20/22  2:03 PM   Specimen: BLOOD  Result Value Ref Range Status   Specimen Description BLOOD BLOOD RIGHT FOREARM  Final   Special Requests BLOOD Blood Culture adequate volume  Final   Culture   Final    NO GROWTH < 24 HOURS Performed at [facility], [address] [place], [place] [address]    Report Status PENDING  Incomplete  Blood Culture (routine x 2)     Status: None (Preliminary result)   Collection Time: 06/20/22  2:11 PM   Specimen: BLOOD  Result Value Ref Range Status   Specimen Description BLOOD BLOOD RIGHT WRIST  Final   Special Requests   Final    BOTTLES DRAWN AEROBIC AND ANAEROBIC Blood Culture adequate volume   Culture   Final    NO GROWTH < 24 HOURS Performed at [facility], [address] [place], [place] [address]    Report Status PENDING  Incomplete  Resp panel by RT-PCR (RSV, Flu A&B, Covid) Anterior Nasal Swab     Status: Abnormal   Collection Time: 06/20/22  2:11 PM   Specimen: Anterior Nasal Swab  Result Value Ref Range Status   SARS Coronavirus 2 by RT PCR POSITIVE (A) NEGATIVE Final    Comment: (NOTE) SARS-CoV-2 target nucleic acids are DETECTED.  The SARS-CoV-2 RNA is generally detectable in upper respiratory specimens during the acute phase of  infection. Positive results are indicative of the presence of the identified virus, but do not rule out bacterial infection or co-infection with other pathogens not detected by the test. Clinical correlation with patient history and other diagnostic information is necessary to determine patient infection status. The expected result is Negative.  Fact Sheet for Patients: EntrepreneurPulse.com.au  Fact Sheet for Healthcare Providers: IncredibleEmployment.be  This test is not yet approved or cleared by the [place] FDA and  has been authorized for detection and/or diagnosis of SARS-CoV-2 by FDA under an Emergency Use Authorization (EUA).  This EUA will remain in effect (meaning this test can be used) for the duration of  the COVID-19 declaration under Section 564(b)(1) of the A ct, 21 U.S.C. section 360bbb-3(b)(1), unless the authorization is terminated or revoked sooner.     Influenza A by PCR NEGATIVE NEGATIVE Final  Influenza B by PCR NEGATIVE NEGATIVE Final    Comment: (NOTE) The Xpert Xpress SARS-CoV-2/FLU/RSV plus assay is intended as an aid in the diagnosis of influenza from Nasopharyngeal swab specimens and should not be used as a sole basis for treatment. Nasal washings and aspirates are unacceptable for Xpert Xpress SARS-CoV-2/FLU/RSV testing.  Fact Sheet for Patients: EntrepreneurPulse.com.au  Fact Sheet for Healthcare Providers: IncredibleEmployment.be  This test is not yet approved or cleared by the [place] FDA and has been authorized for detection and/or diagnosis of SARS-CoV-2 by FDA under an Emergency Use Authorization (EUA). This EUA will remain in effect (meaning this test can be used) for the duration of the COVID-19 declaration under Section 564(b)(1) of the Act, 21 U.S.C. section 360bbb-3(b)(1), unless the authorization is terminated or revoked.     Resp Syncytial Virus by  PCR NEGATIVE NEGATIVE Final    Comment: (NOTE) Fact Sheet for Patients: EntrepreneurPulse.com.au  Fact Sheet for Healthcare Providers: IncredibleEmployment.be  This test is not yet approved or cleared by the [place] FDA and has been authorized for detection and/or diagnosis of SARS-CoV-2 by FDA under an Emergency Use Authorization (EUA). This EUA will remain in effect (meaning this test can be used) for the duration of the COVID-19 declaration under Section 564(b)(1) of the Act, 21 U.S.C. section 360bbb-3(b)(1), unless the authorization is terminated or revoked.  Performed at [facility], [address] [place], [place] [address]      Radiology Studies: CT HEAD WO CONTRAST (5MM)  Result Date: 06/20/2022 CLINICAL DATA:  Mental status change, unknown cause. Altered mental status. History of dementia. EXAM: CT HEAD WITHOUT CONTRAST TECHNIQUE: Contiguous axial images were obtained from the base of the skull through the vertex without intravenous contrast. RADIATION DOSE REDUCTION: This exam was performed according to the departmental dose-optimization program which includes automated exposure control, adjustment of the mA and/or kV according to patient size and/or use of iterative reconstruction technique. COMPARISON:  Head CT 10/13/2019. Brain MRI 08/07/2016. FINDINGS: Despite repeat scanning and despite the technologist's best efforts, the examination is significantly motion degraded and this limits evaluation. Within this limitation, findings are as follows. Brain: Moderate cerebral atrophy. Commensurate prominence of the ventricles and sulci. Moderate patchy and ill-defined hypoattenuation within the cerebral white matter, nonspecific but compatible with chronic small vessel disease. No appreciable acute intracranial hemorrhage, acute infarct, intracranial mass or extra-axial fluid collection. No midline shift. Vascular: No hyperdense  vessel. Atherosclerotic calcifications. Skull: No fracture or aggressive osseous lesion. Sinuses/Orbits: No mass or acute finding within the imaged orbits. Small mucous retention cyst within the left sphenoid sinus IMPRESSION: 1. The examination is significantly motion degraded, limiting evaluation. 2. Within this limitation, there is no appreciable acute intracranial abnormality 3. Moderate chronic small ischemic changes within the cerebral white matter. 4. Moderate cerebral atrophy. Electronically Signed   By: Kellie Simmering D.O.   On: 06/20/2022 15:08   DG Chest Port 1 View  Result Date: 06/20/2022 CLINICAL DATA:  Questionable sepsis. EXAM: PORTABLE CHEST 1 VIEW COMPARISON:  10/13/2019 FINDINGS: Low volume film. The cardio pericardial silhouette is enlarged. Streaky opacity at the left base is similar to prior compatible with chronic atelectasis or scarring. No edema or focal airspace consolidation. No substantial pleural effusion. Status post left shoulder replacement. Telemetry leads overlie the chest. IMPRESSION: Low volume film with chronic left base atelectasis or scarring. Electronically Signed   By: Misty Stanley M.D.   On: 06/20/2022 14:50    Scheduled Meds:  apixaban  5  mg Oral BID   donepezil  10 mg Oral Daily   melatonin  5 mg Oral QHS   memantine  10 mg Oral BID   senna-docusate  2 tablet Oral QHS   sodium chloride flush  3 mL Intravenous Q12H   Continuous Infusions:   LOS: 0 days   Shelly Coss, MD [facility] P12/21/2023, 8:17 AM

## 2022-06-21 NOTE — ED Notes (Signed)
Receiving RN Darl Householder has agreed to accept [facility] once pt has arrived to inpatient unit, all questions and concerns address.

## 2022-06-22 DIAGNOSIS — G9340 Encephalopathy, unspecified: Secondary | ICD-10-CM | POA: Diagnosis present

## 2022-06-22 DIAGNOSIS — Z7901 Long term (current) use of anticoagulants: Secondary | ICD-10-CM | POA: Diagnosis not present

## 2022-06-22 DIAGNOSIS — I4510 Unspecified right bundle-branch block: Secondary | ICD-10-CM | POA: Diagnosis present

## 2022-06-22 DIAGNOSIS — G309 Alzheimer's disease, unspecified: Secondary | ICD-10-CM | POA: Diagnosis present

## 2022-06-22 DIAGNOSIS — R310 Gross hematuria: Secondary | ICD-10-CM | POA: Diagnosis present

## 2022-06-22 DIAGNOSIS — I10 Essential (primary) hypertension: Secondary | ICD-10-CM | POA: Diagnosis present

## 2022-06-22 DIAGNOSIS — Z79899 Other long term (current) drug therapy: Secondary | ICD-10-CM | POA: Diagnosis not present

## 2022-06-22 DIAGNOSIS — I2510 Atherosclerotic heart disease of native coronary artery without angina pectoris: Secondary | ICD-10-CM | POA: Diagnosis present

## 2022-06-22 DIAGNOSIS — Z888 Allergy status to other drugs, medicaments and biological substances status: Secondary | ICD-10-CM | POA: Diagnosis not present

## 2022-06-22 DIAGNOSIS — Z8546 Personal history of malignant neoplasm of prostate: Secondary | ICD-10-CM | POA: Diagnosis not present

## 2022-06-22 DIAGNOSIS — E860 Dehydration: Secondary | ICD-10-CM | POA: Diagnosis present

## 2022-06-22 DIAGNOSIS — F02C18 Dementia in other diseases classified elsewhere, severe, with other behavioral disturbance: Secondary | ICD-10-CM | POA: Diagnosis present

## 2022-06-22 DIAGNOSIS — M159 Polyosteoarthritis, unspecified: Secondary | ICD-10-CM | POA: Diagnosis present

## 2022-06-22 DIAGNOSIS — Z8249 Family history of ischemic heart disease and other diseases of the circulatory system: Secondary | ICD-10-CM | POA: Diagnosis not present

## 2022-06-22 DIAGNOSIS — R319 Hematuria, unspecified: Secondary | ICD-10-CM | POA: Insufficient documentation

## 2022-06-22 DIAGNOSIS — U071 COVID-19: Secondary | ICD-10-CM | POA: Diagnosis present

## 2022-06-22 DIAGNOSIS — Z885 Allergy status to narcotic agent status: Secondary | ICD-10-CM | POA: Diagnosis not present

## 2022-06-22 DIAGNOSIS — Z9079 Acquired absence of other genital organ(s): Secondary | ICD-10-CM | POA: Diagnosis not present

## 2022-06-22 DIAGNOSIS — I252 Old myocardial infarction: Secondary | ICD-10-CM | POA: Diagnosis not present

## 2022-06-22 DIAGNOSIS — I480 Paroxysmal atrial fibrillation: Secondary | ICD-10-CM | POA: Diagnosis present

## 2022-06-22 LAB — CBC WITH DIFFERENTIAL/PLATELET
Abs Immature Granulocytes: 0.04 10*3/uL (ref 0.00–0.07)
Basophils Absolute: 0 10*3/uL (ref 0.0–0.1)
Basophils Relative: 0 %
Eosinophils Absolute: 0 10*3/uL (ref 0.0–0.5)
Eosinophils Relative: 0 %
HCT: 39.3 % (ref 39.0–52.0)
Hemoglobin: 13 g/dL (ref 13.0–17.0)
Immature Granulocytes: 1 %
Lymphocytes Relative: 10 %
Lymphs Abs: 0.8 10*3/uL (ref 0.7–4.0)
MCH: 30.4 pg (ref 26.0–34.0)
MCHC: 33.1 g/dL (ref 30.0–36.0)
MCV: 91.8 fL (ref 80.0–100.0)
Monocytes Absolute: 0.4 10*3/uL (ref 0.1–1.0)
Monocytes Relative: 5 %
Neutro Abs: 6.6 10*3/uL (ref 1.7–7.7)
Neutrophils Relative %: 84 %
Platelets: 103 10*3/uL — ABNORMAL LOW (ref 150–400)
RBC: 4.28 MIL/uL (ref 4.22–5.81)
RDW: 14.3 % (ref 11.5–15.5)
WBC: 7.8 10*3/uL (ref 4.0–10.5)
nRBC: 0 % (ref 0.0–0.2)

## 2022-06-22 LAB — COMPREHENSIVE METABOLIC PANEL
ALT: 14 U/L (ref 0–44)
AST: 29 U/L (ref 15–41)
Albumin: 3.8 g/dL (ref 3.5–5.0)
Alkaline Phosphatase: 49 U/L (ref 38–126)
Anion gap: 5 (ref 5–15)
BUN: 17 mg/dL (ref 8–23)
CO2: 27 mmol/L (ref 22–32)
Calcium: 8.9 mg/dL (ref 8.9–10.3)
Chloride: 104 mmol/L (ref 98–111)
Creatinine, Ser: 0.95 mg/dL (ref 0.61–1.24)
GFR, Estimated: >60 mL/min (ref >60)
Glucose, Bld: 171 mg/dL — ABNORMAL HIGH (ref 70–99)
Potassium: 4.1 mmol/L (ref 3.5–5.1)
Sodium: 136 mmol/L (ref 135–145)
Total Bilirubin: 0.8 mg/dL (ref 0.3–1.2)
Total Protein: 6.7 g/dL (ref 6.5–8.1)

## 2022-06-22 LAB — URINE CULTURE: Culture: NO GROWTH

## 2022-06-22 LAB — LACTIC ACID, PLASMA
Lactic Acid, Venous: 0.8 mmol/L (ref 0.5–1.9)
Lactic Acid, Venous: 0.9 mmol/L (ref 0.5–1.9)

## 2022-06-22 LAB — C-REACTIVE PROTEIN: CRP: 11.6 mg/dL — ABNORMAL HIGH (ref <1.0)

## 2022-06-22 MED ORDER — PREDNISONE 10 MG PO TABS: 50.0000 mg | ORAL_TABLET | Freq: Every day | ORAL | 0 refills | Status: AC

## 2022-06-22 NOTE — Hospital Course (Signed)
Patient is a 88 year old male with history of Alzheimer's dementia, A-fib on Eliquis, CAD, prostate cancer who presented to the ED on 1220 with altered mental status.  Patient also had rigors and chest pain and weakness and bodyaches.  He was found to be COVID-positive.  Initially had an oxygen requirement.  They declined remdesivir during hospitalization.

## 2022-06-22 NOTE — TOC Progression Note (Addendum)
Transition of Care [facility]) - Progression Note    Patient Details  Name: Austin [name] MRN: [id number] Date of Birth: 31-Oct-1930  Transition of Care [facility]) CM/SW [place], [place] Phone Number: 06/22/2022, 3:59 PM  Clinical Narrative:     The patient has triggered a level II PASSR. Patient needs Fl2 30 day letter and progress notes uploaded to [place] [facility].  No PASSR is needed. Patient will transfer back to the SNF today.  Expected Discharge Plan: [place] Barriers to Discharge: Barriers Resolved  Expected Discharge Plan and Services   Discharge Planning Services: CM Consult Post Acute Care Choice: Nursing Home (Memory Care Bed at [facility]) Living arrangements for the past 2 months: [place] Determinants of Health (SDOH) Interventions [place]: No Food Insecurity (06/21/2022)  Housing: Low Risk  (06/21/2022)  Transportation Needs: No Transportation Needs (06/21/2022)  Utilities: Not At Risk (06/21/2022)  Tobacco Use: Low Risk  (06/21/2022)    Readmission Risk Interventions     No data to display

## 2022-06-22 NOTE — Final Progress Note (Signed)
Called patients daughter, Becky Sax, verified they do not want to give patient Remdesivir.

## 2022-06-22 NOTE — TOC Initial Note (Signed)
Transition of Care [facility]) - Initial/Assessment Note    Patient Details  Name: Austin [name] MRN: [id number] Date of Birth: 1930/12/30  Transition of Care [facility]) CM/SW Contact:    Colen Darling, [place] Phone Number: 06/22/2022, 2:20 PM  Clinical Narrative:                  TOC met with the patient at bedside. This patient sees PCP Dr. Dewayne Shorter. He [facility] the pharmacy [facility].He lives on their memory care unit. Daughter Santiago Glad is POA and the other adult children are also coequal POAs.  The patient needs his Fl2 sent to the SNF.  Expected Discharge Plan: Skilled Nursing Facility Barriers to Discharge: Barriers Resolved   Patient Goals and CMS Choice   CMS Medicare.gov Compare Post Acute Care list provided to:: Patient Represenative (must comment) Santiago Glad POA)        Expected Discharge Plan and Services   Discharge Planning Services: CM Consult Post Acute Care Choice: Nursing Home ([facility] Care Bed at [facility]) Living arrangements for the past 2 months: [place]                                      Prior Living Arrangements/Services Living arrangements for the past 2 months: [place] Lives with:: Self Patient language and need for interpreter reviewed:: No Do you feel safe going back to the place where you live?: Yes      Need for Family Participation in Patient Care: Yes (Comment) Care giver support system in place?: Yes (comment)   Criminal Activity/Legal Involvement Pertinent to Current Situation/Hospitalization: No - Comment as needed  Activities of Daily Living Home Assistive Devices/Equipment: Walker (specify type), Eyeglasses ADL Screening (condition at time of admission) Patient's cognitive ability adequate to safely complete daily activities?: Yes Is the patient deaf or have difficulty hearing?: No Does the patient have difficulty seeing, even when wearing glasses/contacts?: No Does the patient have  difficulty concentrating, remembering, or making decisions?: No Patient able to express need for assistance with ADLs?: Yes Does the patient have difficulty dressing or bathing?: No Independently performs ADLs?: No Communication: Independent Dressing (OT): Needs assistance Is this a change from baseline?: Pre-admission baseline Grooming: Independent Feeding: Independent Bathing: Needs assistance Is this a change from baseline?: Pre-admission baseline Toileting: Needs assistance Is this a change from baseline?: Pre-admission baseline In/Out Bed: Needs assistance Is this a change from baseline?: Pre-admission baseline Walks in Home: Independent with device (comment) Does the patient have difficulty walking or climbing stairs?: No Weakness of Legs: Both Weakness of Arms/Hands: Both  Permission Sought/Granted                  Emotional Assessment Appearance:: Appears stated age Attitude/Demeanor/Rapport: Unable to Assess   Orientation: : Oriented to Self      Admission diagnosis:  Acute encephalopathy [G93.40] COVID-19 [U07.1] Patient Active Problem List   Diagnosis Date Noted   COVID-19 06/22/2022   COVID-19 virus infection 06/20/2022   Generalized weakness 06/20/2022   Neoplasm of uncertain behavior of scalp 04/12/2022   Myoclonic jerking 04/12/2022   Localized, primary osteoarthritis of shoulder region 02/26/2022   Weakness of left leg 02/26/2022   Gout 03/14/2018   Anxiety 03/14/2018   NSTEMI (non-ST elevated myocardial infarction) ([place]) 12/17/2016   Cervical radiculopathy 11/29/2016   Mood disorder ([place]) 01/12/2016   Coronary atherosclerosis of native coronary artery  LPRD (laryngopharyngeal reflux disease)    History of prostate cancer    Osteoarthritis, generalized    Alzheimer's dementia with behavioral disturbance ([place]) 11/11/2015   Gastro-esophageal reflux disease without esophagitis 08/05/2015   Non-cardiac chest pain 08/05/2015   Essential  hypertension 12/15/2012   A-fib ([place]) 12/15/2012   Right bundle branch block 12/15/2012   PCP:  Dewayne Shorter, MD Pharmacy:   [place], [place] - [place] [address] [place] [address] Phone: [phone] Fax: [place], [place] [address] Phone: [phone] Fax: [phone]     Social Determinants of Health ([place]) Social History: [place]: No Food Insecurity (06/21/2022)  Housing: Low Risk  (06/21/2022)  Transportation Needs: No Transportation Needs (06/21/2022)  Utilities: Not At Risk (06/21/2022)  Tobacco Use: Low Risk  (06/21/2022)   SDOH Interventions:     Readmission Risk Interventions     No data to display

## 2022-06-22 NOTE — NC FL2 (Signed)
[place] LEVEL OF CARE FORM     IDENTIFICATION  Patient Name: Austin [name] Birthdate: Feb 18, 1931 Sex: male Admission Date (Current Location): 06/20/2022  [facility] and [place] Number:  Engineering geologist and Address:  [facility], [address], [place], [place] [address]      Provider Number: [id number]  Attending Physician Name and Address:  Donnamae Jude, MD  Relative Name and Phone Number:  Santiago Glad [phone]    Current Level of Care: SNF Recommended Level of Care: Nursing Facility Prior Approval Number:    Date Approved/Denied:   PASRR Number: pending  Discharge Plan: SNF    Current Diagnoses: Patient Active Problem List   Diagnosis Date Noted   COVID-19 06/22/2022   COVID-19 virus infection 06/20/2022   Generalized weakness 06/20/2022   Neoplasm of uncertain behavior of scalp 04/12/2022   Myoclonic jerking 04/12/2022   Localized, primary osteoarthritis of shoulder region 02/26/2022   Weakness of left leg 02/26/2022   Gout 03/14/2018   Anxiety 03/14/2018   NSTEMI (non-ST elevated myocardial infarction) ([place]) 12/17/2016   Cervical radiculopathy 11/29/2016   Mood disorder ([place]) 01/12/2016   Coronary atherosclerosis of native coronary artery    LPRD (laryngopharyngeal reflux disease)    History of prostate cancer    Osteoarthritis, generalized    Alzheimer's dementia with behavioral disturbance ([place]) 11/11/2015   Gastro-esophageal reflux disease without esophagitis 08/05/2015   Non-cardiac chest pain 08/05/2015   Essential hypertension 12/15/2012   A-fib ([place]) 12/15/2012   Right bundle branch block 12/15/2012    Orientation RESPIRATION BLADDER Height & Weight     Self  Normal External catheter Weight: 155 lb 13.8 oz (70.7 kg) Height:  '5\' 4"'$  (162.6 cm)  BEHAVIORAL SYMPTOMS/MOOD NEUROLOGICAL BOWEL NUTRITION STATUS     (confusion) Continent Diet (Regular)  AMBULATORY STATUS COMMUNICATION OF NEEDS Skin    Total Care Verbally Other (Comment) (abdominal scarring)                       Personal Care Assistance Level of Assistance  Bathing, Feeding, Dressing Bathing Assistance: Maximum assistance Feeding assistance: Maximum assistance Dressing Assistance: Maximum assistance     Functional Limitations Info  Sight, Hearing, Speech Sight Info: Adequate Hearing Info: Adequate Speech Info: Adequate    SPECIAL CARE FACTORS FREQUENCY                       Contractures Contractures Info: Not present    Additional Factors Info  Code Status Code Status Info: Full             Current Medications (06/22/2022):  This is the current hospital active medication list Current Facility-Administered Medications  Medication Dose Route Frequency Provider Last Rate Last Admin   acetaminophen (TYLENOL) tablet 650 mg  650 mg Oral Q6H PRN Jose Persia, MD   650 mg at 06/21/22 1320   ascorbic acid (VITAMIN C) tablet 500 mg  500 mg Oral BID Shelly Coss, MD   500 mg at 06/22/22 4765   diazepam (VALIUM) tablet 2.5 mg  2.5 mg Oral Q4H PRN Alison Murray, RPH   2.5 mg at 06/21/22 1727   donepezil (ARICEPT) tablet 10 mg  10 mg Oral Daily Jose Persia, MD   10 mg at 06/22/22 0824   melatonin tablet 5 mg  5 mg Oral QHS Jose Persia, MD   5 mg at 06/21/22 2245   memantine (NAMENDA) tablet 10 mg  10 mg  Oral BID Jose Persia, MD   10 mg at 06/22/22 0100   methylPREDNISolone sodium succinate (SOLU-MEDROL) 125 mg/2 mL injection 80 mg  80 mg Intravenous Q24H Shelly Coss, MD   80 mg at 06/21/22 1931   ondansetron (ZOFRAN) tablet 4 mg  4 mg Oral Q6H PRN Jose Persia, MD       Or   ondansetron (ZOFRAN) injection 4 mg  4 mg Intravenous Q6H PRN Jose Persia, MD   4 mg at 06/21/22 1321   polyethylene glycol (MIRALAX / GLYCOLAX) packet 17 g  17 g Oral Daily PRN Jose Persia, MD       senna-docusate (Senokot-S) tablet 2 tablet  2 tablet Oral QHS Jose Persia, MD   2 tablet at  06/21/22 2245   sodium chloride flush (NS) 0.9 % injection 3 mL  3 mL Intravenous Q12H Jose Persia, MD   3 mL at 06/22/22 7121   zinc sulfate capsule 220 mg  220 mg Oral Daily Shelly Coss, MD   220 mg at 06/22/22 9758     Discharge Medications: Please see discharge summary for a list of discharge medications.  Relevant Imaging Results:  Relevant Lab Results:   Additional Information SSN: [id number]  Colen Darling, LCSWA

## 2022-06-22 NOTE — Progress Notes (Signed)
Physical Therapy Treatment Patient Details Name: Austin [name] MRN: [id number] DOB: Jun 27, 1931 Today's Date: 06/22/2022   History of Present Illness 88 y.o. male with medical history significant of Alzheimer's dementia, atrial fibrillation on Eliquis, CAD, prostate cancer, who presents to the ED due to fever and altered mental status.  Found to be Covid +.    PT Comments    Pt received sitting up in bed with daughter present in room. Pt's daughter reports medical team has placed pt on RA. Pt still resistant to mobility, but is better with hand over hand cuing with sequencing UE's and LE's to EOB. Following single step commands with hand placement. Still needs modA and bed elevated to sit EOB. Notable R posterior lean and R lateral bias in sitting requiring max hand over hand cuing to position with fair carryover. With bed elevated pt able to stand to RW with modA and step pivot with RW to recliner with minA for RW sequencing. Pt steps with RW far outside BOS anteriorly but demonstrates good quad control and stability of LE's. Pt returning to seated with all needs in reach. Daughter reports his transfers are looking clsoer to baseline but still appears a little deconditioned. Reports she thinks staff could assist at current level to return to Memory care unit. Author telling daughter if memory care can provide current assist needs d/c back to memory care would be safe and feasible. Will continue to rec STR at discharge as pt still not at capacity to perform ambulation at baseline.     Recommendations for follow up therapy are one component of a multi-disciplinary discharge planning process, led by the attending physician.  Recommendations may be updated based on patient status, additional functional criteria and insurance authorization.  Follow Up Recommendations  Skilled nursing-short term rehab (<3 hours/day) Can patient physically be transported by private vehicle: No   Assistance Recommended  at Discharge Frequent or constant Supervision/Assistance  Patient can return home with the following A lot of help with bathing/dressing/bathroom;A lot of help with walking and/or transfers;Assistance with cooking/housework;Assist for transportation;Help with stairs or ramp for entrance   Equipment Recommendations  None recommended by PT    Recommendations for Other Services       Precautions / Restrictions Precautions Precautions: Fall Restrictions Weight Bearing Restrictions: No     Mobility  Bed Mobility Overal bed mobility: Needs Assistance Bed Mobility: Supine to Sit     Supine to sit: Mod assist, HOB elevated     General bed mobility comments: still resistive, but able to assist to EOb with hand over hand cuing with UE's/LE's Patient Response: Cooperative, Flat affect  Transfers Overall transfer level: Needs assistance Equipment used: Rolling walker (2 wheels) Transfers: Bed to chair/wheelchair/BSC     Step pivot transfers: Min assist, Mod assist, From elevated surface       General transfer comment: modA to stand with minA on RW to sequence stepping to recliner at bedside. Cognition appears to be limiting mobility more so than weakness.    Ambulation/Gait               General Gait Details: deferred due to difficulty with transfers.   Stairs             Wheelchair Mobility    Modified Rankin (Stroke Patients Only)       Balance Overall balance assessment: Needs assistance Sitting-balance support: Bilateral upper extremity supported, Feet supported Sitting balance-Leahy Scale: Poor Sitting balance - Comments: frequent posterior and R lateral lean  Postural control: Posterior lean, Right lateral lean Standing balance support: Bilateral upper extremity supported, Reliant on assistive device for balance Standing balance-Leahy Scale: Fair Standing balance comment: requires RW                            Cognition  Arousal/Alertness: Awake/alert Behavior During Therapy: Flat affect Overall Cognitive Status: History of cognitive impairments - at baseline                                          Exercises General Exercises - Lower Extremity Long Arc Quad: AROM, Strengthening, Both, 10 reps, Seated Hip Flexion/Marching: AROM, Seated, Strengthening, Both, 10 reps Toe Raises: AROM, Strengthening, Both, 10 reps, Seated    General Comments        Pertinent Vitals/Pain Pain Assessment Pain Assessment: Faces Faces Pain Scale: No hurt    Home Living                          Prior Function            PT Goals (current goals can now be found in the care plan section) Acute Rehab PT Goals Patient Stated Goal: per daughter, be back to walking at his ALF PT Goal Formulation: With patient Time For Goal Achievement: 07/04/22 Potential to Achieve Goals: Fair Progress towards PT goals: Progressing toward goals    Frequency    Min 2X/week      PT Plan Current plan remains appropriate    Co-evaluation              AM-PAC PT "6 Clicks" Mobility   Outcome Measure  Help needed turning from your back to your side while in a flat bed without using bedrails?: A Lot Help needed moving from lying on your back to sitting on the side of a flat bed without using bedrails?: A Lot Help needed moving to and from a bed to a chair (including a wheelchair)?: A Lot Help needed standing up from a chair using your arms (e.g., wheelchair or bedside chair)?: A Lot Help needed to walk in hospital room?: Total Help needed climbing 3-5 steps with a railing? : Total 6 Click Score: 10    End of Session Equipment Utilized During Treatment: Gait belt Activity Tolerance: Patient tolerated treatment well Patient left: in chair;with call bell/phone within reach;with chair alarm set;with family/visitor present;Other (comment) (MD at bedside for rounds) Nurse Communication: Mobility  status PT Visit Diagnosis: Muscle weakness (generalized) (M62.81);Difficulty in walking, not elsewhere classified (R26.2);Unsteadiness on feet (R26.81)     Time: 7782-4235 PT Time Calculation (min) (ACUTE ONLY): 25 min  Charges:  $Therapeutic Exercise: 8-22 mins $Therapeutic Activity: 8-22 mins                     Marguriete Wootan M. Fairly IV, PT, DPT Physical Therapist- [facility]  06/22/2022, 12:39 PM

## 2022-06-22 NOTE — TOC Transition Note (Signed)
Transition of Care [facility]) - CM/SW Discharge Note   Patient Details  Name: Austin [name] MRN: [id number] Date of Birth: 08/23/1930  Transition of Care [facility] CM/SW Contact:  Colen Darling, [place] Phone Number: 06/22/2022, 4:57 PM   Clinical Narrative:     Patient is discharging to [facility]. RN calling report. DC summary sent. TOC spoke to [place] at [facility]. EMS transport called.  Final next level of care: Skilled Nursing Facility Barriers to Discharge: Barriers Resolved   Patient Goals and CMS Choice CMS Medicare.gov Compare Post Acute Care list provided to:: Patient Represenative (must comment)    Discharge Placement                Patient chooses bed at: [facility] Patient to be transferred to facility by: [facility] Name of family member notified: Austin [name] Patient and family notified of of transfer: 06/22/22  Discharge Plan and Services Additional resources added to the After Visit Summary for     Discharge Planning Services: CM Consult Post Acute Care Choice: Nursing Home (Memory Care Bed at [facility])                               Social Determinants of Health ([place]) Interventions [place]: No Food Insecurity (06/21/2022)  Housing: Low Risk  (06/21/2022)  Transportation Needs: No Transportation Needs (06/21/2022)  Utilities: Not At Risk (06/21/2022)  Tobacco Use: Low Risk  (06/21/2022)     Readmission Risk Interventions     No data to display

## 2022-06-22 NOTE — Progress Notes (Signed)
   06/22/22 1628  Medical Necessity for Transport Certificate --- IF THIS TRANSPORT IS ROUND TRIP OR SCHEDULED AND REPEATED, A PHYSICIAN MUST COMPLETE THIS FORM  Transport from: Teacher, English as a foreign language) Doctor, hospital to (Location) Other (Comment) ([facility])  Did the patient arrive from a [employer], [place] or Group Home? Yes  Care Facility Name [facility]  Is this the closest appropriate facility? Yes  Date of Transport Service 06/22/22  Name of [facility]  Round Trip Transport? No  Reason for Transport Discharge  Is this a hospice patient? No  Describe the Medical Condition COVID 19, generalized weakness, Alzheimers dementia with behavioral disturbances, Afib  Q1 Are ALL the following "true"? 1. Patient unable to get up from bed without assistance  AND  2. Unable to ambulate  AND  3. Unable to sit in a chair, including wheelchair. Yes  Q2 Could the patient be transported safely by other means of transportation (I.E., wheelchair van)? No  Q3 Please check any of the following conditions that apply at the time of transport: Altered mental status;Risk of injury to self and/or others;Other (Comment) (weakness)  Electronic Signature Colen Darling  Credentials DP  Date Signed 06/22/22

## 2022-06-22 NOTE — Progress Notes (Signed)
Pt Alert. Assisted to getting dressed. PIV removed. Report given to Adelina Mings, Therapist, sports at [facility].

## 2022-06-22 NOTE — Assessment & Plan Note (Signed)
Likely related to traumatic catheterization.  Has held his Eliquis while in house but may resume this as soon as he clears his urine.

## 2022-06-22 NOTE — Discharge Summary (Addendum)
Physician Discharge Summary   Patient: Austin [name] MRN: [id number] DOB: Sep 26, 1930  Admit date:     06/20/2022  Discharge date: 06/22/22  Discharge Physician: Donnamae Jude   PCP: Dewayne Shorter, MD   Recommendations at discharge:   PCP f/u in 1-2 weeks Repeat CBC in 2-3 weeks Resume Eliquis when blood has cleared from urine.  Discharge Diagnoses: Principal Problem:   COVID-19 virus infection Active Problems:   Generalized weakness   Alzheimer's dementia with behavioral disturbance ([place])   A-fib [facility])  Hospital Course: Patient is a 88 year old male with history of Alzheimer's dementia, A-fib on Eliquis, CAD, prostate cancer who presented to the ED on 1220 with altered mental status.  Patient also had rigors and chest pain and weakness and bodyaches.  He was found to be COVID-positive.  Initially had an oxygen requirement.  They declined remdesivir during hospitalization.  Assessment and Plan: * COVID-19 virus infection Patient presenting with 1 day history of fever, rigors, generalized weakness and bodyaches.  COVID-19 PCR is positive.  Patient is not experiencing any respiratory symptoms and chest x-ray is clear; some early hypoxia. Due to lack of efficacy and side effect profile, shared decision making with patient's daughter to defer Paxlovid.  - Supportive management with Tylenol as needed for fever and Zofran as needed for nausea - IV fluid resuscitation overnight  Generalized weakness Likely multifactorial, acutely exacerbated in the setting of COVID-19 infection.  -PT/OT  Alzheimer's dementia with behavioral disturbance ([place]) On examination, patient is pleasantly confused and appears at baseline.  -Continue home Valium, Aricept and Namenda  A-fib ([place]) Sinus rhythm on EKG today.  - Continue home apixaban  Hematuria Likely related to traumatic catheterization.  Has held his Eliquis while in house but may resume this as soon as he clears his urine.  Resume  Eliquis when his urine is clear.  Consultants: None Procedures performed: None  Disposition: Skilled nursing facility Diet recommendation:  Discharge Diet Orders (From admission, onward)     Start     Ordered   06/22/22 0000  Diet - low sodium heart healthy        06/22/22 1634           Cardiac and Carb modified diet DISCHARGE MEDICATION: Allergies as of 06/22/2022       Reactions   Amiodarone Other (See Comments)   Trouble waking/falls   Multaq [dronedarone] Other (See Comments)   unknown   Oxycodone    Albuterol Palpitations        Medication List     TAKE these medications    acetaminophen 500 MG tablet Commonly known as: TYLENOL Take 1,000 mg by mouth 2 (two) times daily.   amoxicillin 500 MG capsule Commonly known as: AMOXIL Take 2,000 mg by mouth daily as needed. Give 1 hour prior to dental procedure   apixaban 5 MG Tabs tablet Commonly known as: ELIQUIS Take 1 tablet (5 mg total) by mouth 2 (two) times daily.   bisacodyl 10 MG suppository Commonly known as: DULCOLAX Place 10 mg rectally daily as needed for moderate constipation.   diazepam 5 MG tablet Commonly known as: VALIUM Take 1 tablet (5 mg total) by mouth daily. In the afternoon, scheduled, and 1/2 tablet (2.5 mg) every 4 hours as needed for anxiety related pain   diclofenac Sodium 1 % Gel Commonly known as: VOLTAREN Apply 2 g topically 4 (four) times daily as needed (NECK PAIN).   donepezil 10 MG tablet Commonly known as: ARICEPT Take 10  mg by mouth daily.   melatonin 3 MG Tabs tablet Take 3 mg by mouth at bedtime.   memantine 10 MG tablet Commonly known as: NAMENDA Take 10 mg by mouth 2 (two) times daily.   polyethylene glycol 17 g packet Commonly known as: MIRALAX / GLYCOLAX Take 17 g by mouth every other day. Scheduled and 1 scoop as needed for constipation daily in liquid of choice   predniSONE 10 MG tablet Commonly known as: DELTASONE Take 5 tablets (50 mg total) by  mouth daily for 5 days.   senna-docusate 8.6-50 MG tablet Commonly known as: Senokot-S Take 2 tablets by mouth at bedtime.   THERAGRAN-M PO Take 1 tablet by mouth daily.        Follow-up Information     Dewayne Shorter, MD Follow up in 1 week(s).   Specialty: Family Medicine Contact information: [place] [address] [phone]                Discharge Exam: Danley Danker Weights   06/20/22 1417  Weight: 70.7 kg   Physical Examination: General appearance - alert, well appearing, and in no distress Chest - clear to auscultation, no wheezes, rales or rhonchi, symmetric air entry Heart - normal rate, regular rhythm, normal S1, S2, no murmurs, rubs, clicks or gallops Abdomen - soft, nontender, nondistended, no masses or organomegaly Extremities - peripheral pulses normal, no pedal edema, no clubbing or cyanosis Neurological - alert and oriented to self only. Follows commands, is pleasant.   Condition at discharge: stable  The results of significant diagnostics from this hospitalization (including imaging, microbiology, ancillary and laboratory) are listed below for reference.   Imaging Studies: CT HEAD WO CONTRAST (5MM)  Result Date: 06/20/2022 CLINICAL DATA:  Mental status change, unknown cause. Altered mental status. History of dementia. EXAM: CT HEAD WITHOUT CONTRAST TECHNIQUE: Contiguous axial images were obtained from the base of the skull through the vertex without intravenous contrast. RADIATION DOSE REDUCTION: This exam was performed according to the departmental dose-optimization program which includes automated exposure control, adjustment of the mA and/or kV according to patient size and/or use of iterative reconstruction technique. COMPARISON:  Head CT 10/13/2019. Brain MRI 08/07/2016. FINDINGS: Despite repeat scanning and despite the technologist's best efforts, the examination is significantly motion degraded and this limits evaluation. Within this  limitation, findings are as follows. Brain: Moderate cerebral atrophy. Commensurate prominence of the ventricles and sulci. Moderate patchy and ill-defined hypoattenuation within the cerebral white matter, nonspecific but compatible with chronic small vessel disease. No appreciable acute intracranial hemorrhage, acute infarct, intracranial mass or extra-axial fluid collection. No midline shift. Vascular: No hyperdense vessel. Atherosclerotic calcifications. Skull: No fracture or aggressive osseous lesion. Sinuses/Orbits: No mass or acute finding within the imaged orbits. Small mucous retention cyst within the left sphenoid sinus IMPRESSION: 1. The examination is significantly motion degraded, limiting evaluation. 2. Within this limitation, there is no appreciable acute intracranial abnormality 3. Moderate chronic small ischemic changes within the cerebral white matter. 4. Moderate cerebral atrophy. Electronically Signed   By: Kellie Simmering D.O.   On: 06/20/2022 15:08   DG Chest Port 1 View  Result Date: 06/20/2022 CLINICAL DATA:  Questionable sepsis. EXAM: PORTABLE CHEST 1 VIEW COMPARISON:  10/13/2019 FINDINGS: Low volume film. The cardio pericardial silhouette is enlarged. Streaky opacity at the left base is similar to prior compatible with chronic atelectasis or scarring. No edema or focal airspace consolidation. No substantial pleural effusion. Status post left shoulder replacement. Telemetry leads overlie  the chest. IMPRESSION: Low volume film with chronic left base atelectasis or scarring. Electronically Signed   By: Misty Stanley M.D.   On: 06/20/2022 14:50    Microbiology: Results for orders placed or performed during the hospital encounter of 06/20/22  Blood Culture (routine x 2)     Status: None (Preliminary result)   Collection Time: 06/20/22  2:03 PM   Specimen: BLOOD  Result Value Ref Range Status   Specimen Description BLOOD BLOOD RIGHT FOREARM  Final   Special Requests BLOOD Blood Culture  adequate volume  Final   Culture   Final    NO GROWTH 2 DAYS Performed at [facility], [address] [place], [place] [address]    Report Status PENDING  Incomplete  Blood Culture (routine x 2)     Status: None (Preliminary result)   Collection Time: 06/20/22  2:11 PM   Specimen: BLOOD  Result Value Ref Range Status   Specimen Description BLOOD BLOOD RIGHT WRIST  Final   Special Requests   Final    BOTTLES DRAWN AEROBIC AND ANAEROBIC Blood Culture adequate volume   Culture   Final    NO GROWTH 2 DAYS Performed at [facility], [address], [place],  [address]    Report Status PENDING  Incomplete  Resp panel by RT-PCR (RSV, Flu A&B, Covid) Anterior Nasal Swab     Status: Abnormal   Collection Time: 06/20/22  2:11 PM   Specimen: Anterior Nasal Swab  Result Value Ref Range Status   SARS Coronavirus 2 by RT PCR POSITIVE (A) NEGATIVE Final    Comment: (NOTE) SARS-CoV-2 target nucleic acids are DETECTED.  The SARS-CoV-2 RNA is generally detectable in upper respiratory specimens during the acute phase of infection. Positive results are indicative of the presence of the identified virus, but do not rule out bacterial infection or co-infection with other pathogens not detected by the test. Clinical correlation with patient history and other diagnostic information is necessary to determine patient infection status. The expected result is Negative.  Fact Sheet for Patients: EntrepreneurPulse.com.au  Fact Sheet for Healthcare Providers: IncredibleEmployment.be  This test is not yet approved or cleared by the [place] FDA and  has been authorized for detection and/or diagnosis of SARS-CoV-2 by FDA under an Emergency Use Authorization (EUA).  This EUA will remain in effect (meaning this test can be used) for the duration of  the COVID-19 declaration under Section 564(b)(1) of the A ct, 21 U.S.C. section  360bbb-3(b)(1), unless the authorization is terminated or revoked sooner.     Influenza A by PCR NEGATIVE NEGATIVE Final   Influenza B by PCR NEGATIVE NEGATIVE Final    Comment: (NOTE) The Xpert Xpress SARS-CoV-2/FLU/RSV plus assay is intended as an aid in the diagnosis of influenza from Nasopharyngeal swab specimens and should not be used as a sole basis for treatment. Nasal washings and aspirates are unacceptable for Xpert Xpress SARS-CoV-2/FLU/RSV testing.  Fact Sheet for Patients: EntrepreneurPulse.com.au  Fact Sheet for Healthcare Providers: IncredibleEmployment.be  This test is not yet approved or cleared by the [place] FDA and has been authorized for detection and/or diagnosis of SARS-CoV-2 by FDA under an Emergency Use Authorization (EUA). This EUA will remain in effect (meaning this test can be used) for the duration of the COVID-19 declaration under Section 564(b)(1) of the Act, 21 U.S.C. section 360bbb-3(b)(1), unless the authorization is terminated or revoked.     Resp Syncytial Virus by PCR NEGATIVE NEGATIVE Final  Comment: (NOTE) Fact Sheet for Patients: EntrepreneurPulse.com.au  Fact Sheet for Healthcare Providers: IncredibleEmployment.be  This test is not yet approved or cleared by the [place] FDA and has been authorized for detection and/or diagnosis of SARS-CoV-2 by FDA under an Emergency Use Authorization (EUA). This EUA will remain in effect (meaning this test can be used) for the duration of the COVID-19 declaration under Section 564(b)(1) of the Act, 21 U.S.C. section 360bbb-3(b)(1), unless the authorization is terminated or revoked.  Performed at [facility], [address], [place], [place] [address]   Urine Culture     Status: None   Collection Time: 06/20/22  2:30 PM   Specimen: Urine, Random  Result Value Ref Range Status   Specimen Description    Final    URINE, RANDOM Performed at [facility], [address] [place], [place] [address]    Special Requests   Final    NONE Performed at [facility], [address], [place], [place] [address]    Culture   Final    NO GROWTH Performed at [facility], [place] [address] [place], [place] [address]    Report Status 06/22/2022 FINAL  Final    Labs: CBC: Recent Labs  Lab 06/20/22 1403 06/21/22 0409 06/22/22 0314  WBC 8.9 7.1 7.8  NEUTROABS 7.0 5.3 6.6  HGB 14.7 12.3* 13.0  HCT 45.3 37.4* 39.3  MCV 93.4 93.5 91.8  PLT 132* 101* 270*   Basic Metabolic Panel: Recent Labs  Lab 06/20/22 1403 06/21/22 0409 06/22/22 0314  NA 139 138 136  K 3.9 3.5 4.1  CL 104 106 104  CO2 '26 26 27  '$ GLUCOSE 116* 104* 171*  BUN 24* 16 17  CREATININE 0.90 0.81 0.95  CALCIUM 9.2 8.3* 8.9   Liver Function Tests: Recent Labs  Lab 06/20/22 1403 06/21/22 0409 06/22/22 0314  AST '28 25 29  '$ ALT '14 13 14  '$ ALKPHOS 62 47 49  BILITOT 0.9 0.9 0.8  PROT 7.3 6.1* 6.7  ALBUMIN 4.5 3.6 3.8   CBG: No results for input(s): "GLUCAP" in the last 168 hours.  Discharge time spent: greater than 30 minutes.  Signed: Donnamae Jude, MD [facility] 06/22/2022

## 2022-06-25 LAB — CULTURE, BLOOD (ROUTINE X 2)
Culture: NO GROWTH
Culture: NO GROWTH
Special Requests: ADEQUATE
Special Requests: ADEQUATE

## 2022-06-26 ENCOUNTER — Other Ambulatory Visit: Payer: Self-pay | Admitting: Adult Health

## 2022-06-26 MED ORDER — DIAZEPAM 5 MG PO TABS: 5.0000 mg | ORAL_TABLET | Freq: Every day | ORAL | 0 refills | Status: DC

## 2022-06-27 ENCOUNTER — Encounter: Payer: Self-pay | Admitting: Student

## 2022-06-27 ENCOUNTER — Non-Acute Institutional Stay: Payer: Medicare PPO | Admitting: Student

## 2022-06-27 DIAGNOSIS — F39 Unspecified mood [affective] disorder: Secondary | ICD-10-CM

## 2022-06-27 DIAGNOSIS — I2510 Atherosclerotic heart disease of native coronary artery without angina pectoris: Secondary | ICD-10-CM

## 2022-06-27 DIAGNOSIS — F419 Anxiety disorder, unspecified: Secondary | ICD-10-CM

## 2022-06-27 DIAGNOSIS — I4891 Unspecified atrial fibrillation: Secondary | ICD-10-CM

## 2022-06-27 DIAGNOSIS — F02818 Dementia in other diseases classified elsewhere, unspecified severity, with other behavioral disturbance: Secondary | ICD-10-CM

## 2022-06-27 DIAGNOSIS — I4510 Unspecified right bundle-branch block: Secondary | ICD-10-CM

## 2022-06-27 DIAGNOSIS — G309 Alzheimer's disease, unspecified: Secondary | ICD-10-CM | POA: Diagnosis not present

## 2022-06-27 DIAGNOSIS — I214 Non-ST elevation (NSTEMI) myocardial infarction: Secondary | ICD-10-CM

## 2022-06-27 DIAGNOSIS — U071 COVID-19: Secondary | ICD-10-CM | POA: Diagnosis not present

## 2022-06-27 NOTE — Progress Notes (Signed)
Location:  Other [place].  Nursing Home Room Number: [facility] 103P Place of Service:  ALF [phone]) Provider:  Dr. Amada Kingfisher, MD  Patient Care Team: Dewayne Shorter, MD as PCP - General [facility])  Extended Emergency Contact Information Primary Emergency Contact: [name],[name] Address: [address]          [place], [place] [address] Johnnette Litter of [place] Phone: [phone] Mobile Phone: [phone] Relation: Daughter Secondary Emergency Contact: [place] of [place] Mobile Phone: [phone] Relation: Daughter  Code Status:  Full Code Goals of care: Advanced Directive information    06/21/2022    8:00 AM  Advanced Directives  Does Patient Have a Medical Advance Directive? Yes  Type of Advance Directive [place]  Does patient want to make changes to medical advance directive? No - Patient declined  Copy of [place] in Chart? Yes - validated most recent copy scanned in chart (See row information)     Chief Complaint  Patient presents with   Hospitalization Follow-up    Hospital Follow up    HPI:  Pt is a 88 y.o. male seen today for Hospital Follow up after diagnosis of COVID.  Patient did not receive remdesivir.  Patient is continue to be on isolation per nursing.  Is been difficult given his memory changes.  On evaluation patient is watering in his bedroom that this had on and pants around his ankles.  He is unable to give his name.  Nonsensical communication at this time.   Past Medical History:  Diagnosis Date   Alzheimer's dementia ([place])    ALZHEIMERS   Atrial fibrillation ([place])    REVEAL DEVICE MEDTRONIC SERIAL #[id number] S MODEL #[id number]   Coronary atherosclerosis of native coronary artery    MI 2014   GERD (gastroesophageal reflux disease)    REFLUX   Gout    History of prostate cancer    HTN (hypertension)    LPRD (laryngopharyngeal reflux disease)     Osteoarthritis, generalized    DJD neck and spine   RBBB    RBBB (right bundle branch block)    Past Surgical History:  Procedure Laterality Date   ABDOMINAL ADHESION SURGERY     APPENDECTOMY     DIRECT LARYNGOSCOPY Left 04/19/2016   Procedure: DIRECT MICROSCOPIC LARYNGOSCOPY WITH BIOPSY;  Surgeon: Margaretha Sheffield, MD;  Location: [facility];  Service: ENT;  Laterality: Left;   EP IMPLANTABLE DEVICE     Reveal device Medtronic Serial #[id number]  Model #[id number]   INGUINAL HERNIA REPAIR Left    KNEE SURGERY     PROSTATECTOMY     SHOULDER ARTHROSCOPY     TREATMENT FISTULA ANAL      Allergies  Allergen Reactions   Amiodarone Other (See Comments)    Trouble waking/falls   Multaq [Dronedarone] Other (See Comments)    unknown   Oxycodone    Albuterol Palpitations    Outpatient Encounter Medications as of 06/27/2022  Medication Sig   acetaminophen (TYLENOL) 500 MG tablet Take 1,000 mg by mouth 2 (two) times daily.   amoxicillin (AMOXIL) 500 MG capsule Take 2,000 mg by mouth daily as needed. Give 1 hour prior to dental procedure   apixaban (ELIQUIS) 5 MG TABS tablet Take 1 tablet (5 mg total) by mouth 2 (two) times daily.   bisacodyl (DULCOLAX) 10 MG suppository Place 10 mg rectally daily as needed for moderate constipation.   diazepam (VALIUM) 5 MG tablet Take  1 tablet (5 mg total) by mouth daily. In the afternoon, scheduled, and 1/2 tablet (2.5 mg) every 4 hours as needed for anxiety related pain   diclofenac Sodium (VOLTAREN) 1 % GEL Apply 2 g topically 4 (four) times daily as needed (NECK PAIN).   donepezil (ARICEPT) 10 MG tablet Take 10 mg by mouth daily.   melatonin 3 MG TABS tablet Take 3 mg by mouth at bedtime.   memantine (NAMENDA) 10 MG tablet Take 10 mg by mouth 2 (two) times daily.   Multiple Vitamins-Minerals (THERAGRAN-M PO) Take 1 tablet by mouth daily.   polyethylene glycol (MIRALAX / GLYCOLAX) 17 g packet Take 17 g by mouth every other day. Scheduled and 1  scoop as needed for constipation daily in liquid of choice   predniSONE (DELTASONE) 10 MG tablet Take 5 tablets (50 mg total) by mouth daily for 5 days.   senna-docusate (SENOKOT-S) 8.6-50 MG tablet Take 2 tablets by mouth at bedtime.   No facility-administered encounter medications on file as of 06/27/2022.    Review of Systems  All other systems reviewed and are negative.   Immunization History  Administered Date(s) Administered   Influenza-Unspecified 05/02/2015, 04/09/2019   Moderna Sars-Covid-2 Vaccination 07/12/2019, 05/11/2020   Pneumococcal Conjugate-13 01/31/2019   Td 04/20/2002   Tdap 12/08/2008   Zoster, Live 11/12/2011   Pertinent  Health Maintenance Due  Topic Date Due   INFLUENZA VACCINE  01/30/2022      09/20/2019    9:34 AM 10/13/2019    7:18 PM 06/21/2022    8:00 AM 06/21/2022   10:00 PM 06/22/2022   11:00 AM  Fall Risk  Patient Fall Risk Level High fall risk High fall risk High fall risk High fall risk High fall risk   Functional Status Survey:    Vitals:   06/27/22 0902  BP: (!) 143/81  Pulse: 78  Resp: 20  Temp: 98.3 F (36.8 C)  SpO2: 93%  Weight: 148 lb 9.6 oz (67.4 kg)  Height: '5\' 4"'$  (1.626 m)   Body mass index is 25.51 kg/m. Physical Exam Vitals reviewed.  Constitutional:      Appearance: Normal appearance.  Cardiovascular:     Rate and Rhythm: Normal rate and regular rhythm.     Pulses: Normal pulses.     Heart sounds: Normal heart sounds.  Pulmonary:     Effort: Pulmonary effort is normal.     Breath sounds: Normal breath sounds.  Abdominal:     General: Bowel sounds are normal.     Palpations: Abdomen is soft.  Skin:    General: Skin is warm and dry.  Neurological:     Mental Status: He is alert. Mental status is at baseline. He is disoriented.     Labs reviewed: Recent Labs    06/20/22 1403 06/21/22 0409 06/22/22 0314  NA 139 138 136  K 3.9 3.5 4.1  CL 104 106 104  CO2 '26 26 27  '$ GLUCOSE 116* 104* 171*  BUN 24*  16 17  CREATININE 0.90 0.81 0.95  CALCIUM 9.2 8.3* 8.9   Recent Labs    06/20/22 1403 06/21/22 0409 06/22/22 0314  AST '28 25 29  '$ ALT '14 13 14  '$ ALKPHOS 62 47 49  BILITOT 0.9 0.9 0.8  PROT 7.3 6.1* 6.7  ALBUMIN 4.5 3.6 3.8   Recent Labs    06/20/22 1403 06/21/22 0409 06/22/22 0314  WBC 8.9 7.1 7.8  NEUTROABS 7.0 5.3 6.6  HGB 14.7 12.3* 13.0  HCT  45.3 37.4* 39.3  MCV 93.4 93.5 91.8  PLT 132* 101* 103*   No results found for: "TSH" No results found for: "HGBA1C" Lab Results  Component Value Date   CHOL 178 05/15/2016   HDL 53 05/15/2016   LDLCALC 104 (H) 05/15/2016   TRIG 106 05/15/2016   CHOLHDL 3.4 05/15/2016    Significant Diagnostic Results in last 30 days:  CT HEAD WO CONTRAST (5MM)  Result Date: 06/20/2022 CLINICAL DATA:  Mental status change, unknown cause. Altered mental status. History of dementia. EXAM: CT HEAD WITHOUT CONTRAST TECHNIQUE: Contiguous axial images were obtained from the base of the skull through the vertex without intravenous contrast. RADIATION DOSE REDUCTION: This exam was performed according to the departmental dose-optimization program which includes automated exposure control, adjustment of the mA and/or kV according to patient size and/or use of iterative reconstruction technique. COMPARISON:  Head CT 10/13/2019. Brain MRI 08/07/2016. FINDINGS: Despite repeat scanning and despite the technologist's best efforts, the examination is significantly motion degraded and this limits evaluation. Within this limitation, findings are as follows. Brain: Moderate cerebral atrophy. Commensurate prominence of the ventricles and sulci. Moderate patchy and ill-defined hypoattenuation within the cerebral white matter, nonspecific but compatible with chronic small vessel disease. No appreciable acute intracranial hemorrhage, acute infarct, intracranial mass or extra-axial fluid collection. No midline shift. Vascular: No hyperdense vessel. Atherosclerotic  calcifications. Skull: No fracture or aggressive osseous lesion. Sinuses/Orbits: No mass or acute finding within the imaged orbits. Small mucous retention cyst within the left sphenoid sinus IMPRESSION: 1. The examination is significantly motion degraded, limiting evaluation. 2. Within this limitation, there is no appreciable acute intracranial abnormality 3. Moderate chronic small ischemic changes within the cerebral white matter. 4. Moderate cerebral atrophy. Electronically Signed   By: Kellie Simmering D.O.   On: 06/20/2022 15:08   DG Chest Port 1 View  Result Date: 06/20/2022 CLINICAL DATA:  Questionable sepsis. EXAM: PORTABLE CHEST 1 VIEW COMPARISON:  10/13/2019 FINDINGS: Low volume film. The cardio pericardial silhouette is enlarged. Streaky opacity at the left base is similar to prior compatible with chronic atelectasis or scarring. No edema or focal airspace consolidation. No substantial pleural effusion. Status post left shoulder replacement. Telemetry leads overlie the chest. IMPRESSION: Low volume film with chronic left base atelectasis or scarring. Electronically Signed   By: Misty Stanley M.D.   On: 06/20/2022 14:50    Assessment/Plan 1. COVID-19 virus infection Patient diagnosed with COVID-19 on 06/20/2022.  Hospitalized and received supportive care with Tylenol for fever and Zofran for nausea.  Received IV fluid resuscitation as well.  Patient will continue to have isolation until 06/30/2022.  2. Alzheimer's dementia with behavioral disturbance [facility]) Patient having difficulty with isolation, however nursing has continue to encourage he states in his room.  Continues to require memory care at this time.  Patient is baseline confused.  Continue Valium, Aricept, Namenda.  3. Anxiety Patient with longstanding anxiety continue home Valium.  Consider weaning in the future if applicable.  4. Atrial fibrillation, unspecified type ([place]) Heart rate within normal range at this time.  Continue  apixaban.  5. NSTEMI (non-ST elevated myocardial infarction) [facility]) Patient denies chest pain at this time continue to monitor.  6. Right bundle branch block Noted on baseline EKG, continue to monitor  7. Atherosclerosis of native coronary artery of native heart without angina pectoris No symptoms at this time continue to monitor.  8. Mood disorder ([place]) Patient symptoms appear stable at this time continue goals.   Family/ staff  Communication: nursing, will need continued follow-up with family regarding goals of care.  Labs/tests ordered:  Routine CBC and CMP  I spent 30 minutes in face to face time, coordination of care, chart review for the care of this patient.  Tomasa Rand, MD, [facility] [phone]

## 2022-07-12 DIAGNOSIS — C4442 Squamous cell carcinoma of skin of scalp and neck: Secondary | ICD-10-CM | POA: Diagnosis not present

## 2022-09-06 ENCOUNTER — Non-Acute Institutional Stay (SKILLED_NURSING_FACILITY): Payer: Medicare PPO | Admitting: Nurse Practitioner

## 2022-09-06 ENCOUNTER — Encounter: Payer: Self-pay | Admitting: Nurse Practitioner

## 2022-09-06 DIAGNOSIS — D6869 Other thrombophilia: Secondary | ICD-10-CM | POA: Diagnosis not present

## 2022-09-06 DIAGNOSIS — G309 Alzheimer's disease, unspecified: Secondary | ICD-10-CM

## 2022-09-06 DIAGNOSIS — I2510 Atherosclerotic heart disease of native coronary artery without angina pectoris: Secondary | ICD-10-CM

## 2022-09-06 DIAGNOSIS — F39 Unspecified mood [affective] disorder: Secondary | ICD-10-CM | POA: Diagnosis not present

## 2022-09-06 DIAGNOSIS — M159 Polyosteoarthritis, unspecified: Secondary | ICD-10-CM | POA: Diagnosis not present

## 2022-09-06 DIAGNOSIS — I4891 Unspecified atrial fibrillation: Secondary | ICD-10-CM

## 2022-09-06 DIAGNOSIS — F02818 Dementia in other diseases classified elsewhere, unspecified severity, with other behavioral disturbance: Secondary | ICD-10-CM

## 2022-09-06 NOTE — Progress Notes (Signed)
Location:  Other [facility]) Nursing Home Room Number: [place]ound of Service:  SNF (31)  Haidyn Kilburg K. Dewaine Oats, NP   Patient Care Team: Dewayne Shorter, MD as PCP - General [facility] Medicine)  Extended Emergency Contact Information Primary Emergency Contact: [name],[name] Address: [address]          [place], [place] [address] Johnnette Litter of [place] Phone: [phone] Mobile Phone: [phone] Relation: Daughter Secondary Emergency Contact: Viviann Spare [place] of [place] Mobile Phone: [phone] Relation: Daughter  Goals of care: Advanced Directive information    09/06/2022    9:14 AM  Advanced Directives  Does Patient Have a Medical Advance Directive? Yes  Type of Paramedic of [place];Living will  Does patient want to make changes to medical advance directive? No - Patient declined  Copy of [place] in Chart? Yes - validated most recent copy scanned in chart (See row information)     Chief Complaint  Patient presents with   Medical Management of Chronic Issues    Routine visit. Discuss need for AWV, shingrix, td/tdap, pneumonia vaccine, and covid boosters or post pone if patient refuses.     HPI:  Pt is a 89 y.o. male seen today for medical management of chronic disease. Pt lives in memory care at [facility]. Staff reports gait is becoming slower with time. Uses walker to help assist with mobility.  Dementia slowly progressing. No increase in anxiety or depression.  Dermatology following due to multiple skin lesions.  Continues on eliquis for a fib- no signs of abnormal bruising or bleeding.  Bowels controlled on current regimen.   Past Medical History:  Diagnosis Date   Alzheimer's dementia ([place])    ALZHEIMERS   Atrial fibrillation ([place])    REVEAL DEVICE MEDTRONIC SERIAL [id number] S MODEL [id number]   Coronary atherosclerosis of native coronary artery    MI 2014   GERD (gastroesophageal reflux disease)     REFLUX   Gout    History of prostate cancer    HTN (hypertension)    LPRD (laryngopharyngeal reflux disease)    Osteoarthritis, generalized    DJD neck and spine   RBBB    RBBB (right bundle branch block)    Past Surgical History:  Procedure Laterality Date   ABDOMINAL ADHESION SURGERY     APPENDECTOMY     DIRECT LARYNGOSCOPY Left 04/19/2016   Procedure: DIRECT MICROSCOPIC LARYNGOSCOPY WITH BIOPSY;  Surgeon: Margaretha Sheffield, MD;  Location: [place];  Service: ENT;  Laterality: Left;   EP IMPLANTABLE DEVICE     Reveal device Medtronic Serial [id number]  Model [id number]   INGUINAL HERNIA REPAIR Left    KNEE SURGERY     PROSTATECTOMY     SHOULDER ARTHROSCOPY     TREATMENT FISTULA ANAL      Allergies  Allergen Reactions   Amiodarone Other (See Comments)    Trouble waking/falls   Multaq [Dronedarone] Other (See Comments)    unknown   Oxycodone    Albuterol Palpitations    Outpatient Encounter Medications as of 09/06/2022  Medication Sig   acetaminophen (TYLENOL) 325 MG tablet Take 650 mg by mouth every 6 (six) hours as needed.   acetaminophen (TYLENOL) 500 MG tablet Take 1,000 mg by mouth 2 (two) times daily.   amoxicillin (AMOXIL) 500 MG capsule Take 2,000 mg by mouth daily as needed. Give 1 hour prior to dental procedure   apixaban (ELIQUIS) 5 MG TABS tablet Take 1 tablet (5 mg  total) by mouth 2 (two) times daily.   bisacodyl (DULCOLAX) 10 MG suppository Place 10 mg rectally daily as needed for moderate constipation.   diazepam (VALIUM) 5 MG tablet Take 1 tablet (5 mg total) by mouth daily. In the afternoon, scheduled, and 1/2 tablet (2.5 mg) every 4 hours as needed for anxiety related pain   diclofenac Sodium (VOLTAREN) 1 % GEL Apply 2 g topically 4 (four) times daily as needed (NECK PAIN).   donepezil (ARICEPT) 10 MG tablet Take 10 mg by mouth daily.   ibuprofen (ADVIL) 200 MG tablet Take 200 mg by mouth every 6 (six) hours as needed.   melatonin 3 MG TABS tablet  Take 3 mg by mouth at bedtime.   memantine (NAMENDA) 10 MG tablet Take 10 mg by mouth 2 (two) times daily.   Multiple Vitamins-Minerals (THERAGRAN-M PO) Take 1 tablet by mouth daily.   polyethylene glycol (MIRALAX / GLYCOLAX) 17 g packet Take 17 g by mouth every other day. Scheduled and 1 scoop as needed for constipation daily in liquid of choice   senna-docusate (SENOKOT-S) 8.6-50 MG tablet Take 2 tablets by mouth at bedtime.   No facility-administered encounter medications on file as of 09/06/2022.    Review of Systems  Unable to perform ROS: Dementia     Immunization History  Administered Date(s) Administered   Influenza-Unspecified 05/02/2015, 04/09/2019   Moderna Sars-Covid-2 Vaccination 07/12/2019, 05/11/2020   Pneumococcal Conjugate-13 01/31/2019   Td 04/20/2002   Tdap 12/08/2008   Zoster, Live 11/12/2011   Pertinent  Health Maintenance Due  Topic Date Due   INFLUENZA VACCINE  01/30/2022      09/20/2019    9:34 AM 10/13/2019    7:18 PM 06/21/2022    8:00 AM 06/21/2022   10:00 PM 06/22/2022   11:00 AM  Fall Risk  (RETIRED) Patient Fall Risk Level High fall risk High fall risk High fall risk High fall risk High fall risk   Functional Status Survey:    Vitals:   09/06/22 0912  BP: 133/77  Pulse: 71  Weight: 144 lb (65.3 kg)  Height: '5\' 4"'$  (1.626 m)   Body mass index is 24.72 kg/m. Physical Exam Constitutional:      General: He is not in acute distress.    Appearance: He is well-developed. He is not diaphoretic.  HENT:     Head: Normocephalic and atraumatic.     Right Ear: External ear normal.     Left Ear: External ear normal.     Mouth/Throat:     Pharynx: No oropharyngeal exudate.  Eyes:     Conjunctiva/sclera: Conjunctivae normal.     Pupils: Pupils are equal, round, and reactive to light.  Cardiovascular:     Rate and Rhythm: Normal rate and regular rhythm.     Heart sounds: Normal heart sounds.  Pulmonary:     Effort: Pulmonary effort is normal.      Breath sounds: Normal breath sounds.  Abdominal:     General: Bowel sounds are normal.     Palpations: Abdomen is soft.  Musculoskeletal:        General: No tenderness.     Cervical back: Normal range of motion and neck supple.     Right lower leg: No edema.     Left lower leg: No edema.  Skin:    General: Skin is warm and dry.  Neurological:     Mental Status: He is alert.     Motor: Weakness (uses walker) present.  Psychiatric:        Mood and Affect: Mood normal.     Labs reviewed: Recent Labs    06/20/22 1403 06/21/22 0409 06/22/22 0314  NA 139 138 136  K 3.9 3.5 4.1  CL 104 106 104  CO2 '26 26 27  '$ GLUCOSE 116* 104* 171*  BUN 24* 16 17  CREATININE 0.90 0.81 0.95  CALCIUM 9.2 8.3* 8.9   Recent Labs    06/20/22 1403 06/21/22 0409 06/22/22 0314  AST '28 25 29  '$ ALT '14 13 14  '$ ALKPHOS 62 47 49  BILITOT 0.9 0.9 0.8  PROT 7.3 6.1* 6.7  ALBUMIN 4.5 3.6 3.8   Recent Labs    06/20/22 1403 06/21/22 0409 06/22/22 0314  WBC 8.9 7.1 7.8  NEUTROABS 7.0 5.3 6.6  HGB 14.7 12.3* 13.0  HCT 45.3 37.4* 39.3  MCV 93.4 93.5 91.8  PLT 132* 101* 103*   No results found for: "TSH" No results found for: "HGBA1C" Lab Results  Component Value Date   CHOL 178 05/15/2016   HDL 53 05/15/2016   LDLCALC 104 (H) 05/15/2016   TRIG 106 05/15/2016   CHOLHDL 3.4 05/15/2016    Significant Diagnostic Results in last 30 days:  No results found.  Assessment/Plan 1. Atrial fibrillation, unspecified type ([place]) Rate controlled without medications.  2. Atherosclerosis of native coronary artery of native heart without angina pectoris -stable without reports on chest pains, continues on eliquis  3. Mood disorder (HCC) Stable, using valium every afternoon- consider GDR.  4. Alzheimer's dementia with behavioral disturbance (HCC) -Stable, no acute changes in cognitive or functional status, continue supportive care with staff and family  5. Acquired thrombophilia ([place]) -due to  a fib, continues on eliquis  6. Osteoarthritis, generalized -controlled on scheduled tylenol.      Carlos American. [place] [phone]

## 2022-09-26 ENCOUNTER — Other Ambulatory Visit: Payer: Self-pay | Admitting: Student

## 2022-09-26 DIAGNOSIS — F419 Anxiety disorder, unspecified: Secondary | ICD-10-CM

## 2022-09-26 MED ORDER — DIAZEPAM 5 MG PO TABS: 5.0000 mg | ORAL_TABLET | Freq: Every day | ORAL | 0 refills | Status: DC

## 2022-09-26 NOTE — Progress Notes (Signed)
Refill of chronic medication.

## 2022-09-27 ENCOUNTER — Other Ambulatory Visit: Payer: Self-pay | Admitting: Nurse Practitioner

## 2022-09-27 DIAGNOSIS — F419 Anxiety disorder, unspecified: Secondary | ICD-10-CM

## 2022-09-27 MED ORDER — DIAZEPAM 5 MG PO TABS: 5.0000 mg | ORAL_TABLET | Freq: Every day | ORAL | 0 refills | Status: DC

## 2022-11-01 ENCOUNTER — Non-Acute Institutional Stay (INDEPENDENT_AMBULATORY_CARE_PROVIDER_SITE_OTHER): Payer: Medicare PPO | Admitting: Nurse Practitioner

## 2022-11-01 ENCOUNTER — Encounter: Payer: Self-pay | Admitting: Nurse Practitioner

## 2022-11-01 DIAGNOSIS — Z0000 Encounter for general adult medical examination without abnormal findings: Secondary | ICD-10-CM | POA: Diagnosis not present

## 2022-11-01 NOTE — Patient Instructions (Signed)
  Austin [name] , Thank you for taking time to come for your Medicare Wellness Visit. I appreciate your ongoing commitment to your health goals. Please review the following plan we discussed and let me know if I can assist you in the future.     This is a list of the screening recommended for you and due dates:  Health Maintenance  Topic Date Due   Zoster (Shingles) Vaccine (1 of 2) Never done   DTaP/Tdap/Td vaccine (3 - Td or Tdap) 12/09/2018   Pneumonia Vaccine (2 of 2 - PPSV23 or PCV20) 01/31/2020   COVID-19 Vaccine (3 - Moderna risk series) 06/08/2020   Flu Shot  01/31/2023   Medicare Annual Wellness Visit  11/01/2023   HPV Vaccine  Aged Out

## 2022-11-01 NOTE — Progress Notes (Signed)
Subjective:   Haziel Molner is a 89 y.o. male who presents for Medicare Annual/Subsequent preventive examination.  Review of Systems     Cardiac Risk Factors include: advanced age (>60men, >70 women);sedentary lifestyle;male gender     Objective:    Today's Vitals   11/01/22 0853  BP: 108/70  Pulse: 82  Resp: (!) 21  Temp: 98 F (36.7 C)  SpO2: 93%  Weight: 142 lb (64.4 kg)  Height: 5\' 4"  (1.626 m)   Body mass index is 24.37 kg/m.     11/01/2022    9:05 AM 09/06/2022    9:14 AM 06/21/2022    8:00 AM 06/05/2022    8:57 AM 04/11/2022    8:54 AM 10/13/2019    7:18 PM 09/20/2019    9:34 AM  Advanced Directives  Does Patient Have a Medical Advance Directive? Yes Yes Yes Yes Yes No Unable to assess, patient is non-responsive or altered mental status  Type of Estate agent of [employer] of [place];Living will Healthcare Power of [employer] of [place];Living will Healthcare Power of [place];Living will    Does patient want to make changes to medical advance directive? No - Patient declined No - Patient declined No - Patient declined No - Patient declined No - Patient declined    Copy of Healthcare Power of Attorney in Chart? Yes - validated most recent copy scanned in chart (See row information) Yes - validated most recent copy scanned in chart (See row information) Yes - validated most recent copy scanned in chart (See row information) Yes - validated most recent copy scanned in chart (See row information) Yes - validated most recent copy scanned in chart (See row information)    Would patient like information on creating a medical advance directive?      No - Patient declined     Current Medications (verified) Outpatient Encounter Medications as of 11/01/2022  Medication Sig   acetaminophen (TYLENOL) 325 MG tablet Take 650 mg by mouth every 6 (six) hours as needed.   acetaminophen (TYLENOL) 500 MG tablet Take 1,000 mg by mouth 2  (two) times daily.   amoxicillin (AMOXIL) 500 MG capsule Take 2,000 mg by mouth daily as needed. Give 1 hour prior to dental procedure   apixaban (ELIQUIS) 5 MG TABS tablet Take 1 tablet (5 mg total) by mouth 2 (two) times daily.   bisacodyl (DULCOLAX) 10 MG suppository Place 10 mg rectally daily as needed for moderate constipation.   diazepam (VALIUM) 5 MG tablet Take 1 tablet (5 mg total) by mouth daily. In the afternoon, scheduled, and 1/2 tablet (2.5 mg) every 4 hours as needed for anxiety related pain   diclofenac Sodium (VOLTAREN) 1 % GEL Apply 2 g topically 4 (four) times daily as needed (NECK PAIN).   donepezil (ARICEPT) 10 MG tablet Take 10 mg by mouth daily.   ibuprofen (ADVIL) 200 MG tablet Take 200 mg by mouth every 6 (six) hours as needed.   melatonin 3 MG TABS tablet Take 3 mg by mouth at bedtime.   memantine (NAMENDA) 10 MG tablet Take 10 mg by mouth 2 (two) times daily.   Multiple Vitamins-Minerals (THERAGRAN-M PO) Take 1 tablet by mouth daily.   polyethylene glycol (MIRALAX / GLYCOLAX) 17 g packet Take 17 g by mouth every other day. Scheduled and 1 scoop as needed for constipation daily in liquid of choice   senna-docusate (SENOKOT-S) 8.6-50 MG tablet Take 2 tablets by mouth at bedtime.  No facility-administered encounter medications on file as of 11/01/2022.    Allergies (verified) Amiodarone, Multaq [dronedarone], Oxycodone, and Albuterol   History: Past Medical History:  Diagnosis Date   Alzheimer's dementia (HCC)    ALZHEIMERS   Atrial fibrillation (HCC)    REVEAL DEVICE MEDTRONIC SERIAL #[id number] S MODEL #[id number]   Coronary atherosclerosis of native coronary artery    MI 2014   GERD (gastroesophageal reflux disease)    REFLUX   Gout    History of prostate cancer    HTN (hypertension)    LPRD (laryngopharyngeal reflux disease)    Osteoarthritis, generalized    DJD neck and spine   RBBB    RBBB (right bundle branch block)    Past Surgical History:  Procedure  Laterality Date   ABDOMINAL ADHESION SURGERY     APPENDECTOMY     DIRECT LARYNGOSCOPY Left 04/19/2016   Procedure: DIRECT MICROSCOPIC LARYNGOSCOPY WITH BIOPSY;  Surgeon: Vernie Murders, MD;  [facility];  Service: ENT;  Laterality: Left;   EP IMPLANTABLE DEVICE     Reveal device Medtronic Serial #[id number]  Model #[id number]   INGUINAL HERNIA REPAIR Left    KNEE SURGERY     PROSTATECTOMY     SHOULDER ARTHROSCOPY     TREATMENT FISTULA ANAL     Family History  Problem Relation Age of Onset   Hypertension Mother    Chronic Renal Failure Mother    Cancer Neg Hx    Heart disease Neg Hx    Diabetes Neg Hx    Prostate cancer Neg Hx    Social History   Socioeconomic History   Marital status: Widowed    Spouse name: Not on file   Number of children: 3   Years of education: Not on file   Highest education level: Not on file  Occupational History   Occupation: Professor --Consulting civil engineer    Comment: [employer]  Tobacco Use   Smoking status: Never   Smokeless tobacco: Never  Vaping Use   Vaping Use: Never used  Substance and Sexual Activity   Alcohol use: No    Comment: 1 martini/ day   Drug use: No   Sexual activity: Not on file  Other Topics Concern   Not on file  Social History Narrative   Has living will   All three children are health care POA   Would accept resuscitation attempts   No tube feeds if cognitively unaware   Social Determinants of Health   Financial Resource Strain: Not on file  Food Insecurity: No Food Insecurity (06/21/2022)   Hunger Vital Sign    Worried About Running Out of Food in the Last Year: Never true    Ran Out of Food in the Last Year: Never true  Transportation Needs: No Transportation Needs (06/21/2022)   PRAPARE - Administrator, Civil Service (Medical): No    Lack of Transportation (Non-Medical): No  Physical Activity: Not on file  Stress: Not on file  Social Connections: Not on file    Tobacco  Counseling Counseling given: Not Answered   Clinical Intake:  Pre-visit preparation completed: Yes  Pain : No/denies pain     BMI - recorded: 24 Nutritional Status: BMI of 19-24  Normal Diabetes: No  How often do you need to have someone help you when you read instructions, pamphlets, or other written materials from your doctor or pharmacy?: 5 - Always  Diabetic?no         Activities of Daily  Living    11/01/2022    8:35 AM 06/21/2022    8:00 AM  In your present state of health, do you have any difficulty performing the following activities:  Hearing? 0 0  Vision? 0 0  Difficulty concentrating or making decisions? 1 0  Walking or climbing stairs? 1 0  Dressing or bathing? 1 0  Doing errands, shopping? 1 0  Preparing Food and eating ? Y   Using the Toilet? Y   In the past six months, have you accidently leaked urine? Y   Do you have problems with loss of bowel control? Y   Managing your Medications? Y   Managing your Finances? Y     Patient Care Team: [name], MD as PCP - General (Family Medicine)  Indicate any recent Medical Services you may have received from other than [facility] providers in the past year (date may be approximate).     Assessment:   This is a routine wellness examination for Johnnathan.  Hearing/Vision screen No results found.  Dietary issues and exercise activities discussed: Current Exercise Habits: The patient does not participate in regular exercise at present   Goals Addressed   None    Depression Screen     No data to display          Fall Risk    11/01/2022    8:42 AM  Fall Risk   Falls in the past year? 1  Number falls in past yr: 1  Injury with Fall? 0  Risk for fall due to : Impaired mobility;History of fall(s);Impaired balance/gait  Follow up Falls prevention discussed;Education provided    FALL RISK PREVENTION PERTAINING TO THE HOME:  Any stairs in or around the home? No  If so, are there any without  handrails? NA  Home free of loose throw rugs in walkways, pet beds, electrical cords, etc? Yes  Adequate lighting in your home to reduce risk of falls? Yes   ASSISTIVE DEVICES UTILIZED TO PREVENT FALLS:  Life alert? No  Use of a cane, walker or w/c? Yes  Grab bars in the bathroom? Yes  Shower chair or bench in shower? Yes  Elevated toilet seat or a handicapped toilet? Yes   TIMED UP AND GO:  Was the test performed? No .    Cognitive Function:        Immunizations Immunization History  Administered Date(s) Administered   Influenza-Unspecified 05/02/2015, 04/09/2019   Moderna Sars-Covid-2 Vaccination 07/12/2019, 05/11/2020   Pneumococcal Conjugate-13 01/31/2019   Td 04/20/2002   Tdap 12/08/2008   Zoster, Live 11/12/2011    TDAP status: Due, Education has been provided regarding the importance of this vaccine. Advised may receive this vaccine at local pharmacy or Health Dept. Aware to provide a copy of the vaccination record if obtained from local pharmacy or Health Dept. Verbalized acceptance and understanding.  Flu Vaccine status: Up to date  Pneumococcal vaccine status: Due, Education has been provided regarding the importance of this vaccine. Advised may receive this vaccine at local pharmacy or Health Dept. Aware to provide a copy of the vaccination record if obtained from local pharmacy or Health Dept. Verbalized acceptance and understanding.  Covid-19 vaccine status: Information provided on how to obtain vaccines.   Qualifies for Shingles Vaccine? Yes   Zostavax completed No   Shingrix Completed?: No.    Education has been provided regarding the importance of this vaccine. Patient has been advised to call insurance company to determine out of pocket expense if  they have not yet received this vaccine. Advised may also receive vaccine at local pharmacy or Health Dept. Verbalized acceptance and understanding.  Screening Tests Health Maintenance  Topic Date Due    Zoster Vaccines- Shingrix (1 of 2) Never done   DTaP/Tdap/Td (3 - Td or Tdap) 12/09/2018   Pneumonia Vaccine 19+ Years old (2 of 2 - PPSV23 or PCV20) 01/31/2020   COVID-19 Vaccine (3 - Moderna risk series) 06/08/2020   INFLUENZA VACCINE  01/31/2023   Medicare Annual Wellness (AWV)  11/01/2023   HPV VACCINES  Aged Out    Health Maintenance  Health Maintenance Due  Topic Date Due   Zoster Vaccines- Shingrix (1 of 2) Never done   DTaP/Tdap/Td (3 - Td or Tdap) 12/09/2018   Pneumonia Vaccine 24+ Years old (2 of 2 - PPSV23 or PCV20) 01/31/2020   COVID-19 Vaccine (3 - Moderna risk series) 06/08/2020    Colorectal cancer screening: No longer required.   Lung Cancer Screening: (Low Dose CT Chest recommended if Age 55-80 years, 30 pack-year currently smoking OR have quit w/in 15years.) does not qualify.   Lung Cancer Screening Referral: na  Additional Screening:  Hepatitis C Screening: does not qualify; Completed na  Vision Screening: Recommended annual ophthalmology exams for early detection of glaucoma and other disorders of the eye. Is the patient up to date with their annual eye exam?  No  Unable to due dementia  Dental Screening: Recommended annual dental exams for proper oral hygiene  Community Resource Referral / Chronic Care Management: CRR required this visit?  No   CCM required this visit?  No      Plan:     I have personally reviewed and noted the following in the patient's chart:   Medical and social history Use of alcohol, tobacco or illicit drugs  Current medications and supplements including opioid prescriptions. Patient is not currently taking opioid prescriptions. Functional ability and status Nutritional status Physical activity Advanced directives List of other physicians Hospitalizations, surgeries, and ER visits in previous 12 months Vitals Screenings to include cognitive, depression, and falls Referrals and appointments  In addition, I have  reviewed and discussed with patient certain preventive protocols, quality metrics, and best practice recommendations. A written personalized care plan for preventive services as well as general preventive health recommendations were provided to patient.     Sharon Seller, NP   11/01/2022   Place of service: [facility]

## 2022-12-14 ENCOUNTER — Encounter: Payer: Self-pay | Admitting: Student

## 2022-12-14 ENCOUNTER — Non-Acute Institutional Stay: Payer: Medicare PPO | Admitting: Student

## 2022-12-14 DIAGNOSIS — I4510 Unspecified right bundle-branch block: Secondary | ICD-10-CM

## 2022-12-14 DIAGNOSIS — I10 Essential (primary) hypertension: Secondary | ICD-10-CM

## 2022-12-14 DIAGNOSIS — F39 Unspecified mood [affective] disorder: Secondary | ICD-10-CM | POA: Diagnosis not present

## 2022-12-14 DIAGNOSIS — G309 Alzheimer's disease, unspecified: Secondary | ICD-10-CM

## 2022-12-14 DIAGNOSIS — I4891 Unspecified atrial fibrillation: Secondary | ICD-10-CM

## 2022-12-14 DIAGNOSIS — F02818 Dementia in other diseases classified elsewhere, unspecified severity, with other behavioral disturbance: Secondary | ICD-10-CM

## 2022-12-14 NOTE — Progress Notes (Signed)
Location:      Place of Service:    Provider:  [name], MD  Patient Care Team: [name], MD as PCP - General [facility] Medicine)  Extended Emergency Contact Information Primary Emergency Contact: [name],[name] Address: [address]          [place], [place] [address] Darden Amber of [place] Home Phone: [phone] Mobile Phone: [phone] Relation: Daughter Secondary Emergency Contact: [name] [place] of [place] Mobile Phone: [phone] Relation: Daughter  Code Status:  *** Goals of care: Advanced Directive information    11/01/2022    9:05 AM  Advanced Directives  Does Patient Have a Medical Advance Directive? Yes  Type of Advance Directive Healthcare Power of Attorney  Does patient want to make changes to medical advance directive? No - Patient declined  Copy of Healthcare Power of Attorney in Chart? Yes - validated most recent copy scanned in chart (See row information)     No chief complaint on file.   HPI:  Pt is a 89 y.o. male seen today for medical management of chronic diseases.    Nursing - needs help with dressing, showering, and cleaning after the bathroom. Incontinent of urine  and bowel. It's functional because of how slowly he moves.   Past Medical History:  Diagnosis Date  . Alzheimer's dementia (HCC)    ALZHEIMERS  . Atrial fibrillation [facility])    REVEAL DEVICE MEDTRONIC SERIAL #[id number] S MODEL #[id number]  . Coronary atherosclerosis of native coronary artery    MI 2014  . GERD (gastroesophageal reflux disease)    REFLUX  . Gout   . History of prostate cancer   . HTN (hypertension)   . LPRD (laryngopharyngeal reflux disease)   . Osteoarthritis, generalized    DJD neck and spine  . RBBB   . RBBB (right bundle branch block)    Past Surgical History:  Procedure Laterality Date  . ABDOMINAL ADHESION SURGERY    . APPENDECTOMY    . DIRECT LARYNGOSCOPY Left 04/19/2016   Procedure: DIRECT MICROSCOPIC LARYNGOSCOPY  WITH BIOPSY;  Surgeon: Vernie Murders, MD;  Location: [facility];  Service: ENT;  Laterality: Left;  . EP IMPLANTABLE DEVICE     Reveal device Medtronic Serial [id number]  Model [id number]  . INGUINAL HERNIA REPAIR Left   . KNEE SURGERY    . PROSTATECTOMY    . SHOULDER ARTHROSCOPY    . TREATMENT FISTULA ANAL      Allergies  Allergen Reactions  . Amiodarone Other (See Comments)    Trouble waking/falls  . Multaq [Dronedarone] Other (See Comments)    unknown  . Oxycodone   . Albuterol Palpitations    Outpatient Encounter Medications as of 12/14/2022  Medication Sig  . acetaminophen (TYLENOL) 325 MG tablet Take 650 mg by mouth every 6 (six) hours as needed.  Marland Kitchen acetaminophen (TYLENOL) 500 MG tablet Take 1,000 mg by mouth 2 (two) times daily.  Marland Kitchen amoxicillin (AMOXIL) 500 MG capsule Take 2,000 mg by mouth daily as needed. Give 1 hour prior to dental procedure  . apixaban (ELIQUIS) 5 MG TABS tablet Take 1 tablet (5 mg total) by mouth 2 (two) times daily.  . bisacodyl (DULCOLAX) 10 MG suppository Place 10 mg rectally daily as needed for moderate constipation.  . diazepam (VALIUM) 5 MG tablet Take 1 tablet (5 mg total) by mouth daily. In the afternoon, scheduled, and 1/2 tablet (2.5 mg) every 4 hours as needed for anxiety related pain  . diclofenac Sodium (VOLTAREN) 1 %  GEL Apply 2 g topically 4 (four) times daily as needed (NECK PAIN).  Marland Kitchen donepezil (ARICEPT) 10 MG tablet Take 10 mg by mouth daily.  Marland Kitchen ibuprofen (ADVIL) 200 MG tablet Take 200 mg by mouth every 6 (six) hours as needed.  . melatonin 3 MG TABS tablet Take 3 mg by mouth at bedtime.  . memantine (NAMENDA) 10 MG tablet Take 10 mg by mouth 2 (two) times daily.  . Multiple Vitamins-Minerals (THERAGRAN-M PO) Take 1 tablet by mouth daily.  . polyethylene glycol (MIRALAX / GLYCOLAX) 17 g packet Take 17 g by mouth every other day. Scheduled and 1 scoop as needed for constipation daily in liquid of choice  . senna-docusate (SENOKOT-S)  8.6-50 MG tablet Take 2 tablets by mouth at bedtime.   No facility-administered encounter medications on file as of 12/14/2022.    Review of Systems  Immunization History  Administered Date(s) Administered  . Influenza-Unspecified 05/02/2015, 04/09/2019  . Moderna Sars-Covid-2 Vaccination 07/12/2019, 05/11/2020  . Pneumococcal Conjugate-13 01/31/2019  . Td 04/20/2002  . Tdap 12/08/2008  . Zoster, Live 11/12/2011   Pertinent  Health Maintenance Due  Topic Date Due  . INFLUENZA VACCINE  01/31/2023      10/13/2019    7:18 PM 06/21/2022    8:00 AM 06/21/2022   10:00 PM 06/22/2022   11:00 AM 11/01/2022    8:42 AM  Fall Risk  Falls in the past year?     1  Was there an injury with Fall?     0  Fall Risk Category Calculator     2  (RETIRED) Patient Fall Risk Level High fall risk High fall risk High fall risk High fall risk   Patient at Risk for Falls Due to     Impaired mobility;History of fall(s);Impaired balance/gait  Fall risk Follow up     Falls prevention discussed;Education provided   Functional Status Survey:    There were no vitals filed for this visit. There is no height or weight on file to calculate BMI. Physical Exam  Labs reviewed: Recent Labs    06/20/22 1403 06/21/22 0409 06/22/22 0314  NA 139 138 136  K 3.9 3.5 4.1  CL 104 106 104  CO2 26 26 27   GLUCOSE 116* 104* 171*  BUN 24* 16 17  CREATININE 0.90 0.81 0.95  CALCIUM 9.2 8.3* 8.9   Recent Labs    06/20/22 1403 06/21/22 0409 06/22/22 0314  AST 28 25 29   ALT 14 13 14   ALKPHOS 62 47 49  BILITOT 0.9 0.9 0.8  PROT 7.3 6.1* 6.7  ALBUMIN 4.5 3.6 3.8   Recent Labs    06/20/22 1403 06/21/22 0409 06/22/22 0314  WBC 8.9 7.1 7.8  NEUTROABS 7.0 5.3 6.6  HGB 14.7 12.3* 13.0  HCT 45.3 37.4* 39.3  MCV 93.4 93.5 91.8  PLT 132* 101* 103*   No results found for: "TSH" No results found for: "HGBA1C" Lab Results  Component Value Date   CHOL 178 05/15/2016   HDL 53 05/15/2016   LDLCALC 104 (H)  05/15/2016   TRIG 106 05/15/2016   CHOLHDL 3.4 05/15/2016    Significant Diagnostic Results in last 30 days:  No results found.  Assessment/Plan There are no diagnoses linked to this encounter.   Family/ staff Communication: ***  Labs/tests ordered:  ***

## 2022-12-16 ENCOUNTER — Encounter: Payer: Self-pay | Admitting: Student

## 2022-12-20 DIAGNOSIS — L814 Other melanin hyperpigmentation: Secondary | ICD-10-CM | POA: Diagnosis not present

## 2022-12-20 DIAGNOSIS — L821 Other seborrheic keratosis: Secondary | ICD-10-CM | POA: Diagnosis not present

## 2022-12-20 DIAGNOSIS — L570 Actinic keratosis: Secondary | ICD-10-CM | POA: Diagnosis not present

## 2022-12-20 DIAGNOSIS — C4441 Basal cell carcinoma of skin of scalp and neck: Secondary | ICD-10-CM | POA: Diagnosis not present

## 2022-12-20 DIAGNOSIS — D485 Neoplasm of uncertain behavior of skin: Secondary | ICD-10-CM | POA: Diagnosis not present

## 2023-01-17 DIAGNOSIS — L578 Other skin changes due to chronic exposure to nonionizing radiation: Secondary | ICD-10-CM | POA: Diagnosis not present

## 2023-01-17 DIAGNOSIS — C4441 Basal cell carcinoma of skin of scalp and neck: Secondary | ICD-10-CM | POA: Diagnosis not present

## 2023-01-17 DIAGNOSIS — L570 Actinic keratosis: Secondary | ICD-10-CM | POA: Diagnosis not present

## 2023-02-11 ENCOUNTER — Non-Acute Institutional Stay: Payer: Medicare PPO | Admitting: Student

## 2023-02-11 ENCOUNTER — Encounter: Payer: Self-pay | Admitting: Student

## 2023-02-11 DIAGNOSIS — I2510 Atherosclerotic heart disease of native coronary artery without angina pectoris: Secondary | ICD-10-CM

## 2023-02-11 DIAGNOSIS — F419 Anxiety disorder, unspecified: Secondary | ICD-10-CM

## 2023-02-11 DIAGNOSIS — G309 Alzheimer's disease, unspecified: Secondary | ICD-10-CM

## 2023-02-11 DIAGNOSIS — I4510 Unspecified right bundle-branch block: Secondary | ICD-10-CM

## 2023-02-11 DIAGNOSIS — I4891 Unspecified atrial fibrillation: Secondary | ICD-10-CM

## 2023-02-11 DIAGNOSIS — I10 Essential (primary) hypertension: Secondary | ICD-10-CM | POA: Diagnosis not present

## 2023-02-11 DIAGNOSIS — F02818 Dementia in other diseases classified elsewhere, unspecified severity, with other behavioral disturbance: Secondary | ICD-10-CM

## 2023-02-11 NOTE — Progress Notes (Signed)
Location:   [facility] Room Number: 103 P Place of Service:  ALF [phone]) Provider:  [name], MD  [name], MD  Patient Care Team: [name], MD as PCP - General (Family Medicine)  Extended Emergency Contact Information Primary Emergency Contact: [name],[name] Address: [address]          [place], [place] [address] Darden [place] of [place] Home Phone: [phone] Mobile Phone: [phone] Relation: Daughter Secondary Emergency Contact: [name] [place] Mobile Phone: [phone] Relation: Daughter  Code Status:  FULL CODE Goals of care: Advanced Directive information    02/11/2023   10:23 AM  Advanced Directives  Does Patient Have a Medical Advance Directive? Yes  Type of Estate agent of [place];Living will  Does patient want to make changes to medical advance directive? No - Patient declined  Copy of Healthcare Power of Attorney in Chart? Yes - validated most recent copy scanned in chart (See row information)     Chief Complaint  Patient presents with   Medical Management of Chronic Issues    Routine follow up   Immunizations    Discuss need for shingrix, Tdap, Pneumonia,COVID and flu vaccine    HPI:  Pt is a 89 y.o. male seen today for medical management of chronic diseases.    Patient continues to show signs of functional and memory decline. Per nursing, family would like patient to pass peacefully when it is his time.   Nursing concerned full code is not aligned with family's goals for patient's end of life.   Past Medical History:  Diagnosis Date   Alzheimer's dementia (HCC)    ALZHEIMERS   Atrial fibrillation (HCC)    REVEAL DEVICE MEDTRONIC SERIAL #[id number] S MODEL #[id number]   Coronary atherosclerosis of native coronary artery    MI 2014   GERD (gastroesophageal reflux disease)    REFLUX   Gout    History of prostate cancer    HTN (hypertension)     LPRD (laryngopharyngeal reflux disease)    Osteoarthritis, generalized    DJD neck and spine   RBBB    RBBB (right bundle branch block)    Past Surgical History:  Procedure Laterality Date   ABDOMINAL ADHESION SURGERY     APPENDECTOMY     DIRECT LARYNGOSCOPY Left 04/19/2016   Procedure: DIRECT MICROSCOPIC LARYNGOSCOPY WITH BIOPSY;  Surgeon: Vernie Murders, MD;  Location: [facility];  Service: ENT;  Laterality: Left;   EP IMPLANTABLE DEVICE     Reveal device Medtronic Serial #[id number]  Model #[id number]   INGUINAL HERNIA REPAIR Left    KNEE SURGERY     PROSTATECTOMY     SHOULDER ARTHROSCOPY     TREATMENT FISTULA ANAL      Allergies  Allergen Reactions   Amiodarone Other (See Comments)    Trouble waking/falls   Multaq [Dronedarone] Other (See Comments)    unknown   Oxycodone    Albuterol Palpitations    Allergies as of 02/11/2023       Reactions   Amiodarone Other (See Comments)   Trouble waking/falls   Multaq [dronedarone] Other (See Comments)   unknown   Oxycodone    Albuterol Palpitations        Medication List        Accurate as of February 11, 2023 10:40 AM. If you have any questions, ask your nurse or doctor.          acetaminophen 500 MG tablet Commonly  known as: TYLENOL Take 1,000 mg by mouth daily as needed for fever or mild pain.   acetaminophen 500 MG tablet Commonly known as: TYLENOL Take 1,000 mg by mouth 3 (three) times daily.   amoxicillin 500 MG capsule Commonly known as: AMOXIL Take 2,000 mg by mouth daily as needed. Give 1 hour prior to dental procedure   apixaban 5 MG Tabs tablet Commonly known as: ELIQUIS Take 1 tablet (5 mg total) by mouth 2 (two) times daily.   benzocaine 10 % mucosal gel Commonly known as: ORAJEL Use as directed 1 Application in the mouth or throat every 6 (six) hours as needed for mouth pain.   bisacodyl 10 MG suppository Commonly known as: DULCOLAX Place 10 mg rectally daily as needed for moderate  constipation.   CHILDRENS MOTRIN PO Take 30 mLs by mouth every 8 (eight) hours as needed. For tooth pain What changed: Another medication with the same name was removed. Continue taking this medication, and follow the directions you see here. Changed by: [name]   diazepam 5 MG tablet Commonly known as: VALIUM Take 1 tablet (5 mg total) by mouth daily. In the afternoon, scheduled, and 1/2 tablet (2.5 mg) every 4 hours as needed for anxiety related pain   diclofenac Sodium 1 % Gel Commonly known as: VOLTAREN Apply 2 g topically 4 (four) times daily as needed (NECK PAIN).   donepezil 10 MG tablet Commonly known as: ARICEPT Take 10 mg by mouth daily.   melatonin 3 MG Tabs tablet Take 3 mg by mouth at bedtime.   memantine 10 MG tablet Commonly known as: NAMENDA Take 10 mg by mouth 2 (two) times daily.   polyethylene glycol 17 g packet Commonly known as: MIRALAX / GLYCOLAX Take 17 g by mouth every other day. Scheduled and 1 scoop as needed for constipation daily in liquid of choice   senna-docusate 8.6-50 MG tablet Commonly known as: Senokot-S Take 2 tablets by mouth at bedtime.   SUNSCREENS EX Apply topically. Apply to exposed areas topically as needed for prophylaxis during sun exposure   THERAGRAN-M PO Take 1 tablet by mouth daily.        Review of Systems  Immunization History  Administered Date(s) Administered   Influenza-Unspecified 05/02/2015, 04/09/2019   Moderna Sars-Covid-2 Vaccination 07/12/2019, 05/11/2020   Pneumococcal Conjugate-13 01/31/2019   Td 04/20/2002   Tdap 12/08/2008   Zoster, Live 11/12/2011   Pertinent  Health Maintenance Due  Topic Date Due   INFLUENZA VACCINE  01/31/2023      10/13/2019    7:18 PM 06/21/2022    8:00 AM 06/21/2022   10:00 PM 06/22/2022   11:00 AM 11/01/2022    8:42 AM  Fall Risk  Falls in the past year?     1  Was there an injury with Fall?     0  Fall Risk Category Calculator     2  (RETIRED) Patient  Fall Risk Level High fall risk High fall risk High fall risk High fall risk   Patient at Risk for Falls Due to     Impaired mobility;History of fall(s);Impaired balance/gait  Fall risk Follow up     Falls prevention discussed;Education provided   Functional Status Survey:    Vitals:   02/11/23 1021  BP: (!) 148/50  Pulse: 91  Resp: (!) 23  Temp: 97.9 F (36.6 C)  SpO2: 93%  Weight: 146 lb 9.6 oz (66.5 kg)  Height: 5\' 4"  (1.626 m)   Body mass  index is 25.16 kg/m. Physical Exam Cardiovascular:     Rate and Rhythm: Normal rate.     Pulses: Normal pulses.  Pulmonary:     Effort: Pulmonary effort is normal.  Neurological:     Mental Status: He is alert. Mental status is at baseline.     Labs reviewed: Recent Labs    06/20/22 1403 06/21/22 0409 06/22/22 0314  NA 139 138 136  K 3.9 3.5 4.1  CL 104 106 104  CO2 26 26 27   GLUCOSE 116* 104* 171*  BUN 24* 16 17  CREATININE 0.90 0.81 0.95  CALCIUM 9.2 8.3* 8.9   Recent Labs    06/20/22 1403 06/21/22 0409 06/22/22 0314  AST 28 25 29   ALT 14 13 14   ALKPHOS 62 47 49  BILITOT 0.9 0.9 0.8  PROT 7.3 6.1* 6.7  ALBUMIN 4.5 3.6 3.8   Recent Labs    06/20/22 1403 06/21/22 0409 06/22/22 0314  WBC 8.9 7.1 7.8  NEUTROABS 7.0 5.3 6.6  HGB 14.7 12.3* 13.0  HCT 45.3 37.4* 39.3  MCV 93.4 93.5 91.8  PLT 132* 101* 103*   No results found for: "TSH" No results found for: "HGBA1C" Lab Results  Component Value Date   CHOL 178 05/15/2016   HDL 53 05/15/2016   LDLCALC 104 (H) 05/15/2016   TRIG 106 05/15/2016   CHOLHDL 3.4 05/15/2016    Significant Diagnostic Results in last 30 days:  No results found.  Assessment/Plan Alzheimer's dementia with behavioral disturbance (HCC)  Anxiety - Plan: diazepam (VALIUM) 5 MG tablet  Atrial fibrillation, unspecified type (HCC)  Right bundle branch block  Atherosclerosis of native coronary artery of native heart without angina pectoris  Essential hypertension Refill of  chronic anxiety medication. Patient with progression of dementia. Hx of CAD and RBBB which makes recovery from a major cardiac event unlikely given patient's age. Systolic BP elevated with low diastolic, additional medications could negatively impact patient's safety with ambulation and increase fall risk. No signs of bleeding on Eliquis. Aricept and namenda for memory chanes. Continue goals of care conversations with family as soon as possible.   Family/ staff Communication: Nursing  Labs/tests ordered:

## 2023-02-14 DIAGNOSIS — L570 Actinic keratosis: Secondary | ICD-10-CM | POA: Diagnosis not present

## 2023-02-14 DIAGNOSIS — L814 Other melanin hyperpigmentation: Secondary | ICD-10-CM | POA: Diagnosis not present

## 2023-02-14 DIAGNOSIS — L821 Other seborrheic keratosis: Secondary | ICD-10-CM | POA: Diagnosis not present

## 2023-02-15 MED ORDER — DIAZEPAM 5 MG PO TABS: 5.0000 mg | ORAL_TABLET | Freq: Every day | ORAL | 0 refills | Status: DC

## 2023-02-21 ENCOUNTER — Encounter: Payer: Self-pay | Admitting: Student

## 2023-03-12 DIAGNOSIS — C4441 Basal cell carcinoma of skin of scalp and neck: Secondary | ICD-10-CM | POA: Diagnosis not present

## 2023-04-08 DIAGNOSIS — L905 Scar conditions and fibrosis of skin: Secondary | ICD-10-CM | POA: Diagnosis not present

## 2023-04-08 DIAGNOSIS — L570 Actinic keratosis: Secondary | ICD-10-CM | POA: Diagnosis not present

## 2023-05-06 DIAGNOSIS — Z79899 Other long term (current) drug therapy: Secondary | ICD-10-CM | POA: Diagnosis not present

## 2023-05-06 LAB — BASIC METABOLIC PANEL
BUN: 22 — AB (ref 4–21)
CO2: 28 — AB (ref 13–22)
Chloride: 105 (ref 99–108)
Creatinine: 0.9 (ref 0.6–1.3)
Glucose: 88
Potassium: 3.8 meq/L (ref 3.5–5.1)
Sodium: 140 (ref 137–147)

## 2023-05-06 LAB — CBC AND DIFFERENTIAL
HCT: 42 (ref 41–53)
Hemoglobin: 14.1 (ref 13.5–17.5)
Neutrophils Absolute: 2727
Platelets: 134 10*3/uL — AB (ref 150–400)
WBC: 5.4

## 2023-05-06 LAB — HEPATIC FUNCTION PANEL
ALT: 9 U/L — AB (ref 10–40)
AST: 17 (ref 14–40)
Alkaline Phosphatase: 47 (ref 25–125)
Bilirubin, Total: 0.5

## 2023-05-06 LAB — COMPREHENSIVE METABOLIC PANEL
Albumin: 4.2 (ref 3.5–5.0)
Calcium: 9.5 (ref 8.7–10.7)
Globulin: 2.1

## 2023-05-06 LAB — CBC: RBC: 4.47 (ref 3.87–5.11)

## 2023-05-09 ENCOUNTER — Encounter: Payer: Self-pay | Admitting: Nurse Practitioner

## 2023-05-09 ENCOUNTER — Non-Acute Institutional Stay: Payer: Medicare PPO | Admitting: Nurse Practitioner

## 2023-05-09 DIAGNOSIS — F02818 Dementia in other diseases classified elsewhere, unspecified severity, with other behavioral disturbance: Secondary | ICD-10-CM | POA: Diagnosis not present

## 2023-05-09 DIAGNOSIS — K5904 Chronic idiopathic constipation: Secondary | ICD-10-CM | POA: Diagnosis not present

## 2023-05-09 DIAGNOSIS — D6869 Other thrombophilia: Secondary | ICD-10-CM | POA: Diagnosis not present

## 2023-05-09 DIAGNOSIS — F39 Unspecified mood [affective] disorder: Secondary | ICD-10-CM

## 2023-05-09 DIAGNOSIS — G309 Alzheimer's disease, unspecified: Secondary | ICD-10-CM

## 2023-05-09 DIAGNOSIS — I4891 Unspecified atrial fibrillation: Secondary | ICD-10-CM

## 2023-05-09 DIAGNOSIS — M159 Polyosteoarthritis, unspecified: Secondary | ICD-10-CM | POA: Diagnosis not present

## 2023-05-09 NOTE — Progress Notes (Signed)
Location:  [facility].  Nursing Home Room Number: [facility] 103P Place of Service:  ALF [phone]) [name], NP  PCP: [name], MD  Patient Care Team: [name], MD as PCP - General [name])  Extended Emergency Contact Information Primary Emergency Contact: [name],[name] Address: [address]          [place], [place] [address] D[place] of [place] Home Phone: [phone] Mobile Phone: [phone] Relation: Daughter Secondary Emergency Contact: [name] [place] Mobile Phone: [phone] Relation: Daughter  Goals of care: Advanced Directive information    05/09/2023    8:56 AM  Advanced Directives  Does Patient Have a Medical Advance Directive? Yes  Type of Estate agent of [place];Living will;Out of facility DNR (pink MOST or yellow form)  Does patient want to make changes to medical advance directive? No - Patient declined  Copy of Healthcare Power of Attorney in Chart? Yes - validated most recent copy scanned in chart (See row information)     Chief Complaint  Patient presents with   Medical Management of Chronic Issues    Medical Management of Chronic Issues.     HPI:  Pt is a 89 y.o. male seen today for medical management of chronic disease.  Pt with hx of dementia, a fib, constipation, OA He resides at [facility].  Can be resistant with care per nursing. Overall mood has been stable.  Weight has been stable.   Past Medical History:  Diagnosis Date   Alzheimer's dementia (HCC)    ALZHEIMERS   Atrial fibrillation (HCC)    REVEAL DEVICE MEDTRONIC SERIAL #[id number] S MODEL #[id number]   Coronary atherosclerosis of native coronary artery    MI 2014   GERD (gastroesophageal reflux disease)    REFLUX   Gout    History of prostate cancer    HTN (hypertension)    LPRD (laryngopharyngeal reflux disease)    Osteoarthritis, generalized    DJD neck and spine   RBBB     RBBB (right bundle branch block)    Past Surgical History:  Procedure Laterality Date   ABDOMINAL ADHESION SURGERY     APPENDECTOMY     DIRECT LARYNGOSCOPY Left 04/19/2016   Procedure: DIRECT MICROSCOPIC LARYNGOSCOPY WITH BIOPSY;  Surgeon: Vernie Murders, MD;  Location: [facility];  Service: ENT;  Laterality: Left;   EP IMPLANTABLE DEVICE     Reveal device Medtronic Serial #[id number]  Model #[id number]   INGUINAL HERNIA REPAIR Left    KNEE SURGERY     PROSTATECTOMY     SHOULDER ARTHROSCOPY     TREATMENT FISTULA ANAL      Allergies  Allergen Reactions   Amiodarone Other (See Comments)    Trouble waking/falls   Multaq [Dronedarone] Other (See Comments)    unknown   Oxycodone    Albuterol Palpitations    Outpatient Encounter Medications as of 05/09/2023  Medication Sig   acetaminophen (TYLENOL) 500 MG tablet Take 1,000 mg by mouth daily as needed for fever or mild pain.   acetaminophen (TYLENOL) 500 MG tablet Take 1,000 mg by mouth 3 (three) times daily.   amoxicillin (AMOXIL) 500 MG capsule Take 2,000 mg by mouth daily as needed. Give 1 hour prior to dental procedure   apixaban (ELIQUIS) 5 MG TABS tablet Take 1 tablet (5 mg total) by mouth 2 (two) times daily.   benzocaine (ORAJEL) 10 % mucosal gel Use as directed 1 Application in the mouth or throat every  6 (six) hours as needed for mouth pain.   bisacodyl (DULCOLAX) 10 MG suppository Place 10 mg rectally daily as needed for moderate constipation.   diazepam (VALIUM) 5 MG tablet Take 1 tablet (5 mg total) by mouth daily. In the afternoon, scheduled, and 1/2 tablet (2.5 mg) every 4 hours as needed for anxiety related pain   diclofenac Sodium (VOLTAREN) 1 % GEL Apply 2 g topically 4 (four) times daily as needed (NECK PAIN).   donepezil (ARICEPT) 5 MG tablet Take 5 mg by mouth at bedtime.   melatonin 3 MG TABS tablet Take 3 mg by mouth at bedtime.   memantine (NAMENDA) 10 MG tablet Take 10 mg by mouth 2 (two) times daily.    Multiple Vitamins-Minerals (THERAGRAN-M PO) Take 1 tablet by mouth daily.   polyethylene glycol (MIRALAX / GLYCOLAX) 17 g packet Take 17 g by mouth every other day. Scheduled and 1 scoop as needed for constipation daily in liquid of choice   senna-docusate (SENOKOT-S) 8.6-50 MG tablet Take 2 tablets by mouth at bedtime.   SUNSCREENS EX Apply topically. Apply to exposed areas topically as needed for prophylaxis during sun exposure   [DISCONTINUED] donepezil (ARICEPT) 10 MG tablet Take 10 mg by mouth daily.   [DISCONTINUED] Ibuprofen (CHILDRENS MOTRIN PO) Take 30 mLs by mouth every 8 (eight) hours as needed. For tooth pain   No facility-administered encounter medications on file as of 05/09/2023.    Review of Systems  Unable to perform ROS: Dementia     Immunization History  Administered Date(s) Administered   Influenza-Unspecified 05/02/2015, 04/09/2019   Moderna Sars-Covid-2 Vaccination 07/12/2019, 05/11/2020   Pneumococcal Conjugate-13 01/31/2019   Td 04/20/2002   Tdap 12/08/2008   Zoster, Live 11/12/2011   Pertinent  Health Maintenance Due  Topic Date Due   INFLUENZA VACCINE  01/31/2023      10/13/2019    7:18 PM 06/21/2022    8:00 AM 06/21/2022   10:00 PM 06/22/2022   11:00 AM 11/01/2022    8:42 AM  Fall Risk  Falls in the past year?     1  Was there an injury with Fall?     0  Fall Risk Category Calculator     2  (RETIRED) Patient Fall Risk Level High fall risk High fall risk High fall risk High fall risk   Patient at Risk for Falls Due to     Impaired mobility;History of fall(s);Impaired balance/gait  Fall risk Follow up     Falls prevention discussed;Education provided   Functional Status Survey:    Vitals:   05/09/23 0849 05/09/23 0858  BP: (!) 152/77 118/68  Pulse: 73   Resp: 20   Temp: 98.1 F (36.7 C)   SpO2: 93%   Weight: 147 lb (66.7 kg)   Height: 5\' 4"  (1.626 m)    Body mass index is 25.23 kg/m.  Wt Readings from Last 3 Encounters:  05/09/23 147 lb  (66.7 kg)  02/11/23 146 lb 9.6 oz (66.5 kg)  12/14/22 144 lb (65.3 kg)    Physical Exam Constitutional:      General: He is not in acute distress.    Appearance: He is well-developed. He is not diaphoretic.  HENT:     Head: Normocephalic and atraumatic.     Right Ear: External ear normal.     Left Ear: External ear normal.     Mouth/Throat:     Pharynx: No oropharyngeal exudate.  Eyes:     Conjunctiva/sclera: Conjunctivae normal.  Pupils: Pupils are equal, round, and reactive to light.  Cardiovascular:     Rate and Rhythm: Normal rate and regular rhythm.     Heart sounds: Normal heart sounds.  Pulmonary:     Effort: Pulmonary effort is normal.     Breath sounds: Normal breath sounds.  Abdominal:     General: Bowel sounds are normal.     Palpations: Abdomen is soft.  Musculoskeletal:        General: No tenderness.     Cervical back: Normal range of motion and neck supple.     Right lower leg: No edema.     Left lower leg: No edema.  Skin:    General: Skin is warm and dry.  Neurological:     Mental Status: He is alert. He is disoriented.     Cranial Nerves: Cranial nerve deficit present.     Gait: Gait abnormal.     Labs reviewed: Recent Labs    06/20/22 1403 06/21/22 0409 06/22/22 0314  NA 139 138 136  K 3.9 3.5 4.1  CL 104 106 104  CO2 26 26 27   GLUCOSE 116* 104* 171*  BUN 24* 16 17  CREATININE 0.90 0.81 0.95  CALCIUM 9.2 8.3* 8.9   Recent Labs    06/20/22 1403 06/21/22 0409 06/22/22 0314  AST 28 25 29   ALT 14 13 14   ALKPHOS 62 47 49  BILITOT 0.9 0.9 0.8  PROT 7.3 6.1* 6.7  ALBUMIN 4.5 3.6 3.8   Recent Labs    06/20/22 1403 06/21/22 0409 06/22/22 0314  WBC 8.9 7.1 7.8  NEUTROABS 7.0 5.3 6.6  HGB 14.7 12.3* 13.0  HCT 45.3 37.4* 39.3  MCV 93.4 93.5 91.8  PLT 132* 101* 103*   No results found for: "TSH" No results found for: "HGBA1C" Lab Results  Component Value Date   CHOL 178 05/15/2016   HDL 53 05/15/2016   LDLCALC 104 (H)  05/15/2016   TRIG 106 05/15/2016   CHOLHDL 3.4 05/15/2016    Significant Diagnostic Results in last 30 days:  No results found.  Assessment/Plan 1. constipation Stable on current reigmen, continues on senokot and miralax  2. Mood disorder (HCC) Ongoing but stable, will continues valium daily  3. Acquired thrombophilia (HCC) Due to a fib, continues on eliquis. Follow up cbc  4. Atrial fibrillation, unspecified type (HCC) Rate controlled, not currently on medications. Will monitor.   5. Alzheimer's dementia with behavioral disturbance (HCC) -Stable, no acute changes in cognitive or functional status, continue supportive care.   6. Osteoarthritis, generalized Continues on scheduled tylenol. No reports of pain.    Janene Harvey. Biagio Borg [facility] [phone]

## 2023-05-22 ENCOUNTER — Other Ambulatory Visit: Payer: Self-pay | Admitting: Student

## 2023-05-22 DIAGNOSIS — F419 Anxiety disorder, unspecified: Secondary | ICD-10-CM

## 2023-05-22 MED ORDER — DIAZEPAM 5 MG PO TABS: 5.0000 mg | ORAL_TABLET | Freq: Every day | ORAL | 0 refills | Status: DC

## 2023-05-22 NOTE — Progress Notes (Signed)
Updated refill of long term anxiety medication.

## 2023-06-03 DIAGNOSIS — L570 Actinic keratosis: Secondary | ICD-10-CM | POA: Diagnosis not present

## 2023-06-03 DIAGNOSIS — L821 Other seborrheic keratosis: Secondary | ICD-10-CM | POA: Diagnosis not present

## 2023-06-03 DIAGNOSIS — L814 Other melanin hyperpigmentation: Secondary | ICD-10-CM | POA: Diagnosis not present

## 2023-08-09 ENCOUNTER — Encounter: Payer: Self-pay | Admitting: Student

## 2023-08-09 ENCOUNTER — Non-Acute Institutional Stay: Payer: Self-pay | Admitting: Student

## 2023-08-09 DIAGNOSIS — F02818 Dementia in other diseases classified elsewhere, unspecified severity, with other behavioral disturbance: Secondary | ICD-10-CM | POA: Diagnosis not present

## 2023-08-09 DIAGNOSIS — Z66 Do not resuscitate: Secondary | ICD-10-CM | POA: Diagnosis not present

## 2023-08-09 DIAGNOSIS — G309 Alzheimer's disease, unspecified: Secondary | ICD-10-CM | POA: Diagnosis not present

## 2023-08-09 DIAGNOSIS — I2510 Atherosclerotic heart disease of native coronary artery without angina pectoris: Secondary | ICD-10-CM | POA: Diagnosis not present

## 2023-08-09 DIAGNOSIS — I4891 Unspecified atrial fibrillation: Secondary | ICD-10-CM

## 2023-08-09 DIAGNOSIS — K5904 Chronic idiopathic constipation: Secondary | ICD-10-CM | POA: Diagnosis not present

## 2023-08-09 NOTE — Progress Notes (Signed)
 Location:  [facility] Room Number: 103 P Place of Service:  ALF [phone]) Provider:  Abdul Fine, MD  Patient Care Team: Abdul Fine, MD as PCP - General (Family Medicine)  Extended Emergency Contact Information Primary Emergency Contact: [name],[name] Address: [address]          [place], [place] [address] [place] Home Phone: [phone] Mobile Phone: [phone] Relation: Daughter Secondary Emergency Contact: Therman Pao  [place] Mobile Phone: [phone] Relation: Daughter  Code Status:  DNR Goals of care: Advanced Directive information    05/09/2023    8:56 AM  Advanced Directives  Does Patient Have a Medical Advance Directive? Yes  Type of Estate Agent of [place];Living will;Out of facility DNR (pink MOST or yellow form)  Does patient want to make changes to medical advance directive? No - Patient declined  Copy of Healthcare Power of Attorney in Chart? Yes - validated most recent copy scanned in chart (See row information)     Chief Complaint  Patient presents with   Medical Management of Chronic Issues    Routine visit. Discuss need for shingrix, td/tdap, pneumonia, covid and flu vaccines    HPI:  Pt is a 89 y.o. male seen today for medical management of chronic diseases.  Patient is humming and smiling. Unable to follow direction to lean forward to listen to his lungs.   Nursing notes that patient has had multiple falls in recent months. Is currently using the lift for safe transfer. He required feeding for the last day or so.   Contacted daughter and HCPOA due to concern for patient's decline. She states the transition to DNR was challenging in an effort to respect his previous desire to control how and when he dies. Discussed in detail the stages of dementia, and how he has reached the 7th stage with his changes in communication and incontinence. Discussed concern that this  appears to be his baseline, and not a sign of an underlying infection that is leading to this change. Discussed a MOST form and recommend discussing and completing with siblings.   Past Medical History:  Diagnosis Date   Alzheimer's dementia (HCC)    ALZHEIMERS   Atrial fibrillation (HCC)    REVEAL DEVICE MEDTRONIC SERIAL #[id number] S MODEL #[id number]   Coronary atherosclerosis of native coronary artery    MI 2014   GERD (gastroesophageal reflux disease)    REFLUX   Gout    History of prostate cancer    HTN (hypertension)    LPRD (laryngopharyngeal reflux disease)    Osteoarthritis, generalized    DJD neck and spine   RBBB    RBBB (right bundle branch block)    Past Surgical History:  Procedure Laterality Date   ABDOMINAL ADHESION SURGERY     APPENDECTOMY     DIRECT LARYNGOSCOPY Left 04/19/2016   Procedure: DIRECT MICROSCOPIC LARYNGOSCOPY WITH BIOPSY;  Surgeon: Deward Argue, MD;  Location: [facility];  Service: ENT;  Laterality: Left;   EP IMPLANTABLE DEVICE     Reveal device Medtronic Serial #[id number]  Model #[id number]   INGUINAL HERNIA REPAIR Left    KNEE SURGERY     PROSTATECTOMY     SHOULDER ARTHROSCOPY     TREATMENT FISTULA ANAL      Allergies  Allergen Reactions   Amiodarone Other (See Comments)    Trouble waking/falls   Multaq [Dronedarone] Other (See Comments)    unknown   Oxycodone     Albuterol Palpitations  Outpatient Encounter Medications as of 08/09/2023  Medication Sig   guaiFENesin (ROBITUSSIN) 100 MG/5ML liquid Take 10 mLs by mouth every 4 (four) hours as needed for cough or to loosen phlegm.   omeprazole (PRILOSEC) 20 MG capsule Take 20 mg by mouth at bedtime.   acetaminophen  (TYLENOL ) 500 MG tablet Take 1,000 mg by mouth daily as needed for fever or mild pain.   acetaminophen  (TYLENOL ) 500 MG tablet Take 1,000 mg by mouth 3 (three) times daily.   amoxicillin (AMOXIL) 500 MG capsule Take 2,000 mg by mouth daily as needed. Give 1 hour prior to  dental procedure   apixaban  (ELIQUIS ) 5 MG TABS tablet Take 1 tablet (5 mg total) by mouth 2 (two) times daily.   benzocaine (ORAJEL) 10 % mucosal gel Use as directed 1 Application in the mouth or throat every 6 (six) hours as needed for mouth pain.   bisacodyl (DULCOLAX) 10 MG suppository Place 10 mg rectally daily as needed for moderate constipation.   diazepam  (VALIUM ) 5 MG tablet Take 1 tablet (5 mg total) by mouth daily. In the afternoon, scheduled, and 1/2 tablet (2.5 mg) every 4 hours as needed for anxiety related pain   diclofenac  Sodium (VOLTAREN ) 1 % GEL Apply 2 g topically 4 (four) times daily as needed (NECK PAIN).   donepezil  (ARICEPT ) 5 MG tablet Take 5 mg by mouth at bedtime.   melatonin 3 MG TABS tablet Take 3 mg by mouth at bedtime.   memantine  (NAMENDA ) 10 MG tablet Take 10 mg by mouth 2 (two) times daily.   Multiple Vitamins-Minerals (THERAGRAN-M PO) Take 1 tablet by mouth daily.   polyethylene glycol (MIRALAX  / GLYCOLAX ) 17 g packet Take 17 g by mouth every other day. Scheduled and 1 scoop as needed for constipation daily in liquid of choice   senna-docusate (SENOKOT-S) 8.6-50 MG tablet Take 2 tablets by mouth at bedtime.   SUNSCREENS EX Apply topically. Apply to exposed areas topically as needed for prophylaxis during sun exposure (Patient not taking: Reported on 08/09/2023)   No facility-administered encounter medications on file as of 08/09/2023.    Review of Systems  Immunization History  Administered Date(s) Administered   Influenza-Unspecified 05/02/2015, 04/09/2019   Moderna Sars-Covid-2 Vaccination 07/12/2019, 05/11/2020   Pneumococcal Conjugate-13 01/31/2019   Td 04/20/2002   Tdap 12/08/2008   Zoster, Live 11/12/2011   Pertinent  Health Maintenance Due  Topic Date Due   INFLUENZA VACCINE  01/31/2023      10/13/2019    7:18 PM 06/21/2022    8:00 AM 06/21/2022   10:00 PM 06/22/2022   11:00 AM 11/01/2022    8:42 AM  Fall Risk  Falls in the past year?     1   Was there an injury with Fall?     0  Fall Risk Category Calculator     2  (RETIRED) Patient Fall Risk Level High fall risk High fall risk High fall risk High fall risk   Patient at Risk for Falls Due to     Impaired mobility;History of fall(s);Impaired balance/gait  Fall risk Follow up     Falls prevention discussed;Education provided   Functional Status Survey:    Vitals:   08/09/23 0840  BP: 116/71  Pulse: 91  Temp: 98.6 F (37 C)  Weight: 146 lb (66.2 kg)  Height: 5' 4 (1.626 m)   Body mass index is 25.06 kg/m. Physical Exam Constitutional:      Comments: Sitting up in wheelchair humming  Cardiovascular:     Rate and Rhythm: Normal rate.     Pulses: Normal pulses.  Neurological:     Mental Status: He is alert. He is disoriented.     Comments: Patient unable to follow directions to participate in exam     Labs reviewed: Recent Labs    05/06/23 0000  NA 140  K 3.8  CL 105  CO2 28*  BUN 22*  CREATININE 0.9  CALCIUM 9.5   Recent Labs    05/06/23 0000  AST 17  ALT 9*  ALKPHOS 47  ALBUMIN 4.2   Recent Labs    05/06/23 0000  WBC 5.4  NEUTROABS 2,727.00  HGB 14.1  HCT 42  PLT 134*   No results found for: TSH No results found for: HGBA1C Lab Results  Component Value Date   CHOL 178 05/15/2016   HDL 53 05/15/2016   LDLCALC 104 (H) 05/15/2016   TRIG 106 05/15/2016   CHOLHDL 3.4 05/15/2016    Significant Diagnostic Results in last 30 days:  No results found.  Assessment/Plan Alzheimer's dementia with behavioral disturbance (HCC)  DNR (do not resuscitate) - Plan: Do not attempt resuscitation (DNR)  Atrial fibrillation, unspecified type (HCC)  Atherosclerosis of native coronary artery of native heart without angina pectoris  Chronic idiopathic constipation Patient with history of dementia with continued progression. Notable acute decline in the last few weeks with more falls, weakness, less communication. Patient remains on namenda   and aricept . Periodically resistant to care and receives valium  daily. Rate well-controlled. Continue eliquis  no signs of bleeding at this time. Continue miralax  every other day for history of constipation.   Continue GOC conversations with family regarding management with patient's continued decline.    Family/ staff Communication: nursing, daughter  Labs/tests ordered:  none

## 2023-08-10 ENCOUNTER — Encounter: Payer: Self-pay | Admitting: Student

## 2023-08-16 ENCOUNTER — Other Ambulatory Visit: Payer: Self-pay | Admitting: Student

## 2023-08-16 DIAGNOSIS — F419 Anxiety disorder, unspecified: Secondary | ICD-10-CM

## 2023-08-23 ENCOUNTER — Other Ambulatory Visit: Payer: Self-pay | Admitting: Adult Health

## 2023-08-23 DIAGNOSIS — F419 Anxiety disorder, unspecified: Secondary | ICD-10-CM

## 2023-08-23 MED ORDER — DIAZEPAM 5 MG PO TABS: 2.5000 mg | ORAL_TABLET | ORAL | 0 refills | Status: DC | PRN

## 2023-08-23 MED ORDER — DIAZEPAM 5 MG PO TABS: 5.0000 mg | ORAL_TABLET | Freq: Every day | ORAL | 0 refills | Status: DC

## 2023-09-30 DIAGNOSIS — L570 Actinic keratosis: Secondary | ICD-10-CM | POA: Diagnosis not present

## 2023-09-30 DIAGNOSIS — L905 Scar conditions and fibrosis of skin: Secondary | ICD-10-CM | POA: Diagnosis not present

## 2023-09-30 DIAGNOSIS — L821 Other seborrheic keratosis: Secondary | ICD-10-CM | POA: Diagnosis not present

## 2023-09-30 DIAGNOSIS — L814 Other melanin hyperpigmentation: Secondary | ICD-10-CM | POA: Diagnosis not present

## 2023-10-18 ENCOUNTER — Other Ambulatory Visit: Payer: Self-pay | Admitting: Student

## 2023-10-21 NOTE — Telephone Encounter (Signed)
 Assisted living resident with 2 listings, will send to Valrie Gehrig, MD to review

## 2023-11-05 ENCOUNTER — Encounter: Payer: Self-pay | Admitting: Nurse Practitioner

## 2023-11-05 ENCOUNTER — Non-Acute Institutional Stay: Payer: Self-pay | Admitting: Nurse Practitioner

## 2023-11-05 DIAGNOSIS — K5904 Chronic idiopathic constipation: Secondary | ICD-10-CM | POA: Diagnosis not present

## 2023-11-05 DIAGNOSIS — F39 Unspecified mood [affective] disorder: Secondary | ICD-10-CM

## 2023-11-05 DIAGNOSIS — G309 Alzheimer's disease, unspecified: Secondary | ICD-10-CM

## 2023-11-05 DIAGNOSIS — M159 Polyosteoarthritis, unspecified: Secondary | ICD-10-CM | POA: Diagnosis not present

## 2023-11-05 DIAGNOSIS — I10 Essential (primary) hypertension: Secondary | ICD-10-CM

## 2023-11-05 DIAGNOSIS — K219 Gastro-esophageal reflux disease without esophagitis: Secondary | ICD-10-CM

## 2023-11-05 DIAGNOSIS — D6869 Other thrombophilia: Secondary | ICD-10-CM | POA: Insufficient documentation

## 2023-11-05 DIAGNOSIS — F02818 Dementia in other diseases classified elsewhere, unspecified severity, with other behavioral disturbance: Secondary | ICD-10-CM | POA: Diagnosis not present

## 2023-11-05 DIAGNOSIS — I4891 Unspecified atrial fibrillation: Secondary | ICD-10-CM

## 2023-11-05 NOTE — Assessment & Plan Note (Signed)
 Stable, without complaints of pain. Continues on tylenol  1000 mg TID

## 2023-11-05 NOTE — Assessment & Plan Note (Signed)
 Stable on omeprazole. No signs of GERD at this time.

## 2023-11-05 NOTE — Assessment & Plan Note (Signed)
 Advanced, no acute changes in cognitive or functional status, continue supportive care.  Continues on namenda  and aricept .

## 2023-11-05 NOTE — Progress Notes (Signed)
 Location:  Other [facility].  Nursing Home Room Number: [facility] 103P Place of Service:  ALF [phone]) Gilbert Lab, NP  PCP: Valrie Gehrig, MD  Patient Care Team: Valrie Gehrig, MD as PCP - General [facility] Medicine)  Extended Emergency Contact Information Primary Emergency Contact: [name],[name] Address: [address]          [place], [place] [address] [place] Home Phone: [phone] Mobile Phone: [phone] Relation: Daughter Secondary Emergency Contact: Margurite Shim  [place] Mobile Phone: [phone] Relation: Daughter  Goals of care: Advanced Directive information    05/09/2023    8:56 AM  Advanced Directives  Does Patient Have a Medical Advance Directive? Yes  Type of Estate agent of [place];Living will;Out of facility DNR (pink MOST or yellow form)  Does patient want to make changes to medical advance directive? No - Patient declined  Copy of Healthcare Power of Attorney in Chart? Yes - validated most recent copy scanned in chart (See row information)     Chief Complaint  Patient presents with   Medical Management of Chronic Issues    Medical Management of Chronic Issues.     HPI:  Pt is a 89 y.o. male seen today for medical management of chronic disease.  Pt with progressive dementia in [facility] care [facility].  Staff reports increase resistance with care at times Overall mood is stable.  No signs of pain Weight has been stable.  Nursing has no concerns at this time.    Past Medical History:  Diagnosis Date   Alzheimer's dementia (HCC)    ALZHEIMERS   Atrial fibrillation (HCC)    REVEAL DEVICE MEDTRONIC SERIAL #[id number] S MODEL #[id number]   Coronary atherosclerosis of native coronary artery    MI 2014   GERD (gastroesophageal reflux disease)    REFLUX   Gout    History of prostate cancer    HTN (hypertension)    LPRD (laryngopharyngeal reflux disease)    Osteoarthritis,  generalized    DJD neck and spine   RBBB    RBBB (right bundle branch block)    Past Surgical History:  Procedure Laterality Date   ABDOMINAL ADHESION SURGERY     APPENDECTOMY     DIRECT LARYNGOSCOPY Left 04/19/2016   Procedure: DIRECT MICROSCOPIC LARYNGOSCOPY WITH BIOPSY;  Surgeon: Mellody Sprout, MD;  Location: [facility];  Service: ENT;  Laterality: Left;   EP IMPLANTABLE DEVICE     Reveal device Medtronic Serial #[id number]  Model #[id number]   INGUINAL HERNIA REPAIR Left    KNEE SURGERY     PROSTATECTOMY     SHOULDER ARTHROSCOPY     TREATMENT FISTULA ANAL      Allergies  Allergen Reactions   Amiodarone Other (See Comments)    Trouble waking/falls   Multaq [Dronedarone] Other (See Comments)    unknown   Oxycodone     Albuterol Palpitations    Outpatient Encounter Medications as of 11/05/2023  Medication Sig   acetaminophen  (TYLENOL ) 500 MG tablet Take 1,000 mg by mouth daily as needed for fever or mild pain.   acetaminophen  (TYLENOL ) 500 MG tablet Take 1,000 mg by mouth 3 (three) times daily.   amoxicillin (AMOXIL) 500 MG capsule Take 2,000 mg by mouth daily as needed. Give 1 hour prior to dental procedure   apixaban  (ELIQUIS ) 5 MG TABS tablet Take 1 tablet (5 mg total) by mouth 2 (two) times daily.   benzocaine (ORAJEL) 10 % mucosal gel Use as directed  1 Application in the mouth or throat every 6 (six) hours as needed for mouth pain.   bisacodyl (DULCOLAX) 10 MG suppository Place 10 mg rectally daily as needed for moderate constipation.   diazepam  (VALIUM ) 5 MG tablet Take 1 tablet (5 mg total) by mouth daily in the afternoon.   diazepam  (VALIUM ) 5 MG tablet Take 0.5 tablets (2.5 mg total) by mouth every 4 (four) hours as needed for anxiety.   diclofenac  Sodium (VOLTAREN ) 1 % GEL Apply 2 g topically 4 (four) times daily as needed (NECK PAIN).   donepezil  (ARICEPT ) 5 MG tablet Take 5 mg by mouth at bedtime.   guaiFENesin (ROBITUSSIN) 100 MG/5ML liquid Take 10 mLs by mouth  every 4 (four) hours as needed for cough or to loosen phlegm.   melatonin 3 MG TABS tablet Take 3 mg by mouth at bedtime.   memantine  (NAMENDA ) 10 MG tablet Take 10 mg by mouth 2 (two) times daily.   Multiple Vitamins-Minerals (THERAGRAN-M PO) Take 1 tablet by mouth daily.   nystatin (MYCOSTATIN/NYSTOP) powder Apply 1 Application topically every 12 (twelve) hours as needed.   omeprazole (PRILOSEC) 20 MG capsule Take 20 mg by mouth at bedtime.   polyethylene glycol (MIRALAX  / GLYCOLAX ) 17 g packet Take 17 g by mouth every other day. Scheduled and 1 scoop as needed for constipation daily in liquid of choice   senna-docusate (SENOKOT-S) 8.6-50 MG tablet Take 2 tablets by mouth at bedtime.   [DISCONTINUED] diazepam  (VALIUM ) 5 MG tablet TAKE 1/2 TABLET (2.5MG ) BY MOUTH EVERY FOUR HOURS AS NEEDED ANXIETY;TAKE 1 TABLET BY MOUTH ONCE DAILY   No facility-administered encounter medications on file as of 11/05/2023.    Review of Systems  Unable to perform ROS: Dementia     Immunization History  Administered Date(s) Administered   Influenza-Unspecified 05/02/2015, 04/09/2019   Moderna Sars-Covid-2 Vaccination 07/12/2019, 05/11/2020   Pneumococcal Conjugate-13 01/31/2019   Td 04/20/2002   Tdap 12/08/2008   Zoster, Live 11/12/2011   Pertinent  Health Maintenance Due  Topic Date Due   INFLUENZA VACCINE  01/31/2024      10/13/2019    7:18 PM 06/21/2022    8:00 AM 06/21/2022   10:00 PM 06/22/2022   11:00 AM 11/01/2022    8:42 AM  Fall Risk  Falls in the past year?     1  Was there an injury with Fall?     0  Fall Risk Category Calculator     2  (RETIRED) Patient Fall Risk Level High fall risk High fall risk High fall risk High fall risk   Patient at Risk for Falls Due to     Impaired mobility;History of fall(s);Impaired balance/gait  Fall risk Follow up     Falls prevention discussed;Education provided   Functional Status Survey:    Vitals:   11/05/23 0829  BP: 115/68  Pulse: 89  Resp:  (!) 21  Temp: 98.4 F (36.9 C)  SpO2: 93%  Weight: 155 lb 9.6 oz (70.6 kg)  Height: 5\' 4"  (1.626 m)   Wt Readings from Last 3 Encounters:  11/05/23 155 lb 9.6 oz (70.6 kg)  08/09/23 146 lb (66.2 kg)  05/09/23 147 lb (66.7 kg)    Body mass index is 26.71 kg/m. Physical Exam Constitutional:      General: He is not in acute distress.    Appearance: He is well-developed. He is not diaphoretic.  HENT:     Head: Normocephalic and atraumatic.     Right Ear:  External ear normal.     Left Ear: External ear normal.     Mouth/Throat:     Pharynx: No oropharyngeal exudate.  Eyes:     Conjunctiva/sclera: Conjunctivae normal.     Pupils: Pupils are equal, round, and reactive to light.  Cardiovascular:     Rate and Rhythm: Normal rate and regular rhythm.     Heart sounds: Normal heart sounds.  Pulmonary:     Effort: Pulmonary effort is normal.     Breath sounds: Normal breath sounds.  Abdominal:     General: Bowel sounds are normal.     Palpations: Abdomen is soft.  Musculoskeletal:        General: No tenderness.     Cervical back: Normal range of motion and neck supple.     Right lower leg: No edema.     Left lower leg: No edema.  Skin:    General: Skin is warm and dry.  Neurological:     Mental Status: He is alert and oriented to person, place, and time.     Labs reviewed: Recent Labs    05/06/23 0000  NA 140  K 3.8  CL 105  CO2 28*  BUN 22*  CREATININE 0.9  CALCIUM 9.5   Recent Labs    05/06/23 0000  AST 17  ALT 9*  ALKPHOS 47  ALBUMIN 4.2   Recent Labs    05/06/23 0000  WBC 5.4  NEUTROABS 2,727.00  HGB 14.1  HCT 42  PLT 134*   No results found for: "TSH" No results found for: "HGBA1C" Lab Results  Component Value Date   CHOL 178 05/15/2016   HDL 53 05/15/2016   LDLCALC 104 (H) 05/15/2016   TRIG 106 05/15/2016   CHOLHDL 3.4 05/15/2016    Significant Diagnostic Results in last 30 days:  No results found.  Assessment/Plan Osteoarthritis,  generalized Stable, without complaints of pain. Continues on tylenol  1000 mg TID  Mood disorder (HCC) Ongoing, continues on valium  scheduled and PRN  Gastro-esophageal reflux disease without esophagitis Stable on omeprazole. No signs of GERD at this time.   Essential hypertension Blood pressure well controlled, goal bp <140/90   Chronic idiopathic constipation Stable on current regimen, cntinues on senokot-s at bedtime   Alzheimer's dementia with behavioral disturbance (HCC) Advanced, no acute changes in cognitive or functional status, continue supportive care.  Continues on namenda  and aricept .   Acquired thrombophilia (HCC) Due to a fib, continues on eliquis  BID Follow CBC routinely  A-fib (HCC) Rate controlled at this time.      Zadie Deemer K. Denney Fisherman [facility] [phone]

## 2023-11-05 NOTE — Assessment & Plan Note (Signed)
 Ongoing, continues on valium  scheduled and PRN

## 2023-11-05 NOTE — Assessment & Plan Note (Signed)
 Due to a fib, continues on eliquis  BID Follow CBC routinely

## 2023-11-05 NOTE — Assessment & Plan Note (Signed)
 Blood pressure well controlled, goal bp <140/90

## 2023-11-05 NOTE — Assessment & Plan Note (Signed)
 Stable on current regimen, cntinues on senokot-s at bedtime

## 2023-11-05 NOTE — Assessment & Plan Note (Signed)
 Rate controlled at this time.

## 2023-11-07 ENCOUNTER — Non-Acute Institutional Stay (SKILLED_NURSING_FACILITY): Payer: Self-pay | Admitting: Nurse Practitioner

## 2023-11-07 ENCOUNTER — Encounter: Payer: Self-pay | Admitting: Nurse Practitioner

## 2023-11-07 DIAGNOSIS — Z0000 Encounter for general adult medical examination without abnormal findings: Secondary | ICD-10-CM

## 2023-11-07 NOTE — Patient Instructions (Signed)
  Mr. Veleta , Thank you for taking time to come for your Medicare Wellness Visit. I appreciate your ongoing commitment to your health goals. Please review the following plan we discussed and let me know if I can assist you in the future.    This is a list of the screening recommended for you and due dates:  Health Maintenance  Topic Date Due   Zoster (Shingles) Vaccine (1 of 2) 01/24/1950   DTaP/Tdap/Td vaccine (3 - Td or Tdap) 12/09/2018   Pneumonia Vaccine (2 of 2 - PPSV23) 03/28/2019   COVID-19 Vaccine (3 - Moderna risk series) 07/02/2024*   Flu Shot  01/31/2024   Medicare Annual Wellness Visit  11/06/2024   HPV Vaccine  Aged Out   Meningitis B Vaccine  Aged Out  *Topic was postponed. The date shown is not the original due date.

## 2023-11-07 NOTE — Progress Notes (Signed)
 Subjective:   Austin [name] is a 89 y.o. male who presents for Medicare Annual/Subsequent preventive examination.  Visit Complete: In person [facility]   Cardiac Risk Factors include: advanced age (>46men, >35 women);sedentary lifestyle;male gender     Objective:     Today's Vitals   11/07/23 0823  BP: 122/74  Pulse: 73  Resp: 20  Temp: 97.8 F (36.6 C)  SpO2: 93%  Weight: 155 lb 9.6 oz (70.6 kg)  Height: 5\' 4"  (1.626 m)   Body mass index is 26.71 kg/m.     11/07/2023    8:36 AM 05/09/2023    8:56 AM 02/11/2023   10:23 AM 12/14/2022    3:14 PM 11/01/2022    9:05 AM 09/06/2022    9:14 AM 06/21/2022    8:00 AM  Advanced Directives  Does Patient Have a Medical Advance Directive? Yes Yes Yes Yes Yes Yes Yes  Type of Estate agent of [place];Out of facility DNR (pink MOST or yellow form) Healthcare Power of [place];Living will;Out of facility DNR (pink MOST or yellow form) Healthcare Power of [place];Living will Healthcare Power of [employer] Power of [employer] Power of [place];Living will Healthcare Power of Attorney  Does patient want to make changes to medical advance directive? No - Patient declined No - Patient declined No - Patient declined No - Patient declined No - Patient declined No - Patient declined No - Patient declined  Copy of Healthcare Power of Attorney in Chart? Yes - validated most recent copy scanned in chart (See row information) Yes - validated most recent copy scanned in chart (See row information) Yes - validated most recent copy scanned in chart (See row information) Yes - validated most recent copy scanned in chart (See row information) Yes - validated most recent copy scanned in chart (See row information) Yes - validated most recent copy scanned in chart (See row information) Yes - validated most recent copy scanned in chart (See row information)    Current Medications (verified) Outpatient Encounter  Medications as of 11/07/2023  Medication Sig   acetaminophen  (TYLENOL ) 500 MG tablet Take 1,000 mg by mouth daily as needed for fever or mild pain.   acetaminophen  (TYLENOL ) 500 MG tablet Take 1,000 mg by mouth 3 (three) times daily.   amoxicillin (AMOXIL) 500 MG capsule Take 2,000 mg by mouth daily as needed. Give 1 hour prior to dental procedure   apixaban  (ELIQUIS ) 5 MG TABS tablet Take 1 tablet (5 mg total) by mouth 2 (two) times daily.   benzocaine (ORAJEL) 10 % mucosal gel Use as directed 1 Application in the mouth or throat every 6 (six) hours as needed for mouth pain.   bisacodyl (DULCOLAX) 10 MG suppository Place 10 mg rectally daily as needed for moderate constipation.   diazepam  (VALIUM ) 5 MG tablet Take 1 tablet (5 mg total) by mouth daily in the afternoon.   diazepam  (VALIUM ) 5 MG tablet Take 0.5 tablets (2.5 mg total) by mouth every 4 (four) hours as needed for anxiety.   diclofenac  Sodium (VOLTAREN ) 1 % GEL Apply 2 g topically 4 (four) times daily as needed (NECK PAIN).   donepezil  (ARICEPT ) 5 MG tablet Take 5 mg by mouth at bedtime.   Emollient (CERAVE DAILY MOISTURIZING) LOTN Apply topically 2 (two) times daily.   guaiFENesin (ROBITUSSIN) 100 MG/5ML liquid Take 10 mLs by mouth every 4 (four) hours as needed for cough or to loosen phlegm.   melatonin 3 MG TABS tablet  Take 3 mg by mouth at bedtime.   memantine  (NAMENDA ) 10 MG tablet Take 10 mg by mouth 2 (two) times daily.   Multiple Vitamins-Minerals (THERAGRAN-M PO) Take 1 tablet by mouth daily.   nystatin (MYCOSTATIN/NYSTOP) powder Apply 1 Application topically every 12 (twelve) hours as needed.   omeprazole (PRILOSEC) 20 MG capsule Take 20 mg by mouth at bedtime.   polyethylene glycol (MIRALAX  / GLYCOLAX ) 17 g packet Take 17 g by mouth every other day. Scheduled and 1 scoop as needed for constipation daily in liquid of choice   senna-docusate (SENOKOT-S) 8.6-50 MG tablet Take 2 tablets by mouth at bedtime.   No  facility-administered encounter medications on file as of 11/07/2023.    Allergies (verified) Amiodarone, Multaq [dronedarone], Oxycodone , and Albuterol   History: Past Medical History:  Diagnosis Date   Alzheimer's dementia (HCC)    ALZHEIMERS   Atrial fibrillation (HCC)    REVEAL DEVICE MEDTRONIC SERIAL #[id number] S MODEL #[id number]   Coronary atherosclerosis of native coronary artery    MI 2014   GERD (gastroesophageal reflux disease)    REFLUX   Gout    History of prostate cancer    HTN (hypertension)    LPRD (laryngopharyngeal reflux disease)    Osteoarthritis, generalized    DJD neck and spine   RBBB    RBBB (right bundle branch block)    Past Surgical History:  Procedure Laterality Date   ABDOMINAL ADHESION SURGERY     APPENDECTOMY     DIRECT LARYNGOSCOPY Left 04/19/2016   Procedure: DIRECT MICROSCOPIC LARYNGOSCOPY WITH BIOPSY;  Surgeon: Mellody Sprout, MD;  Location: [facility];  Service: ENT;  Laterality: Left;   EP IMPLANTABLE DEVICE     Reveal device Medtronic Serial #[id number]  Model #[id number]   INGUINAL HERNIA REPAIR Left    KNEE SURGERY     PROSTATECTOMY     SHOULDER ARTHROSCOPY     TREATMENT FISTULA ANAL     Family History  Problem Relation Age of Onset   Hypertension Mother    Chronic Renal Failure Mother    Cancer Neg Hx    Heart disease Neg Hx    Diabetes Neg Hx    Prostate cancer Neg Hx    Social History   Socioeconomic History   Marital status: Widowed    Spouse name: Not on file   Number of children: 3   Years of education: Not on file   Highest education level: Not on file  Occupational History   Occupation: Professor --Consulting civil engineer    Comment: [employer]  Tobacco Use   Smoking status: Never   Smokeless tobacco: Never  Vaping Use   Vaping status: Never Used  Substance and Sexual Activity   Alcohol use: No    Comment: 1 martini/ day   Drug use: No   Sexual activity: Not on file  Other Topics Concern   Not on file  Social  History Narrative   Has living will   All three children are health care POA   Would accept resuscitation attempts   No tube feeds if cognitively unaware   Social Drivers of Health   Financial Resource Strain: Not on file  Food Insecurity: No Food Insecurity (06/21/2022)   Hunger Vital Sign    Worried About Running Out of Food in the Last Year: Never true    Ran Out of Food in the Last Year: Never true  Transportation Needs: No Transportation Needs (06/21/2022)   PRAPARE - Transportation  Lack of Transportation (Medical): No    Lack of Transportation (Non-Medical): No  Physical Activity: Not on file  Stress: Not on file  Social Connections: Not on file    Tobacco Counseling Counseling given: Not Answered   Clinical Intake:  Pre-visit preparation completed: Yes  Pain : No/denies pain     BMI - recorded: 26 Nutritional Status: BMI 25 -29 Overweight Nutritional Risks: None Diabetes: No  How often do you need to have someone help you when you read instructions, pamphlets, or other written materials from your doctor or pharmacy?: 5 - Always         Activities of Daily Living    11/07/2023   10:19 AM  In your present state of health, do you have any difficulty performing the following activities:  Hearing? 0  Vision? 0  Difficulty concentrating or making decisions? 1  Walking or climbing stairs? 1  Dressing or bathing? 1  Doing errands, shopping? 1  Preparing Food and eating ? Y  Using the Toilet? Y  In the past six months, have you accidently leaked urine? Y  Do you have problems with loss of bowel control? Y  Managing your Medications? Y  Managing your Finances? Y  Housekeeping or managing your Housekeeping? Y    Patient Care Team: Valrie Gehrig, MD as PCP - General (Family Medicine)  Indicate any recent Medical Services you may have received from other than [facility] providers in the past year (date may be approximate).     Assessment:    This is a  routine wellness examination for [place].  Hearing/Vision screen No results found.   Goals Addressed   None    Depression Screen    11/07/2023   10:20 AM  PHQ 2/9 Scores  Exception Documentation Other- indicate reason in comment box  Not completed dementia    Fall Risk    11/07/2023   10:20 AM 11/01/2022    8:42 AM  Fall Risk   Falls in the past year? 1 1  Number falls in past yr: 1 1  Injury with Fall? 0 0  Risk for fall due to : History of fall(s) Impaired mobility;History of fall(s);Impaired balance/gait  Follow up Falls evaluation completed Falls prevention discussed;Education provided    MEDICARE RISK AT HOME: Medicare Risk at Home Any stairs in or around the home?: No Home free of loose throw rugs in walkways, pet beds, electrical cords, etc?: Yes Adequate lighting in your home to reduce risk of falls?: Yes Life alert?: No Use of a cane, walker or w/c?: Yes Grab bars in the bathroom?: Yes Shower chair or bench in shower?: Yes Elevated toilet seat or a handicapped toilet?: Yes  TIMED UP AND GO:  Was the test performed?  No    Cognitive Function:        Immunizations Immunization History  Administered Date(s) Administered   Influenza-Unspecified 05/02/2015, 04/09/2019   Moderna Sars-Covid-2 Vaccination 07/12/2019, 05/11/2020   Pneumococcal Conjugate-13 01/31/2019   Td 04/20/2002   Tdap 12/08/2008   Zoster, Live 11/12/2011    TDAP status: Due, Education has been provided regarding the importance of this vaccine. Advised may receive this vaccine at local pharmacy or Health Dept. Aware to provide a copy of the vaccination record if obtained from local pharmacy or Health Dept. Verbalized acceptance and understanding.  Flu Vaccine status: Up to date  Pneumococcal vaccine status: Declined,  Education has been provided regarding the importance of this vaccine but patient still declined. Advised may receive  this vaccine at local pharmacy or Health Dept. Aware to  provide a copy of the vaccination record if obtained from local pharmacy or Health Dept. Verbalized acceptance and understanding.   Covid-19 vaccine status: Declined, Education has been provided regarding the importance of this vaccine but patient still declined. Advised may receive this vaccine at local pharmacy or Health Dept.or vaccine clinic. Aware to provide a copy of the vaccination record if obtained from local pharmacy or Health Dept. Verbalized acceptance and understanding.  Qualifies for Shingles Vaccine? Yes   Zostavax completed No   Shingrix Completed?: No.    Education has been provided regarding the importance of this vaccine. Patient has been advised to call insurance company to determine out of pocket expense if they have not yet received this vaccine. Advised may also receive vaccine at local pharmacy or Health Dept. Verbalized acceptance and understanding.  Screening Tests Health Maintenance  Topic Date Due   Zoster Vaccines- Shingrix (1 of 2) 01/24/1950   DTaP/Tdap/Td (3 - Td or Tdap) 12/09/2018   Pneumonia Vaccine 72+ Years old (2 of 2 - PPSV23) 03/28/2019   COVID-19 Vaccine (3 - Moderna risk series) 07/02/2024 (Originally 06/08/2020)   INFLUENZA VACCINE  01/31/2024   Medicare Annual Wellness (AWV)  11/06/2024   HPV VACCINES  Aged Out   Meningococcal B Vaccine  Aged Out    Health Maintenance  Health Maintenance Due  Topic Date Due   Zoster Vaccines- Shingrix (1 of 2) 01/24/1950   DTaP/Tdap/Td (3 - Td or Tdap) 12/09/2018   Pneumonia Vaccine 31+ Years old (2 of 2 - PPSV23) 03/28/2019    Colorectal cancer screening: No longer required.   Lung Cancer Screening: (Low Dose CT Chest recommended if Age 90-80 years, 20 pack-year currently smoking OR have quit w/in 15years.) does not qualify.   Lung Cancer Screening Referral: na  Additional Screening:  Hepatitis C Screening: does not qualif  Vision Screening: Recommended annual ophthalmology exams for early detection  of glaucoma and other disorders of the eye. Is the patient up to date with their annual eye exam?  no unable to complete due to dementia  Dental Screening: Recommended annual dental exams for proper oral hygiene  Community Resource Referral / Chronic Care Management: CRR required this visit?  No   CCM required this visit?  No     Plan:     I have personally reviewed and noted the following in the patient's chart:   Medical and social history Use of alcohol, tobacco or illicit drugs  Current medications and supplements including opioid prescriptions. Patient is not currently taking opioid prescriptions. Functional ability and status Nutritional status Physical activity Advanced directives List of other physicians Hospitalizations, surgeries, and ER visits in previous 12 months Vitals Screenings to include cognitive, depression, and falls Referrals and appointments  In addition, I have reviewed and discussed with patient certain preventive protocols, quality metrics, and best practice recommendations. A written personalized care plan for preventive services as well as general preventive health recommendations were provided to patient.     Verma Gobble, NP   11/07/2023

## 2023-11-11 DIAGNOSIS — D649 Anemia, unspecified: Secondary | ICD-10-CM | POA: Diagnosis not present

## 2023-11-11 LAB — COMPREHENSIVE METABOLIC PANEL WITH GFR
Albumin: 4 (ref 3.5–5.0)
Calcium: 9.4 (ref 8.7–10.7)
Globulin: 2.2
eGFR: 82

## 2023-11-11 LAB — BASIC METABOLIC PANEL WITH GFR
BUN: 23 — AB (ref 4–21)
CO2: 32 — AB (ref 13–22)
Chloride: 105 (ref 99–108)
Creatinine: 0.9 (ref 0.6–1.3)
Glucose: 88
Potassium: 3.9 meq/L (ref 3.5–5.1)
Sodium: 142 (ref 137–147)

## 2023-11-11 LAB — CBC AND DIFFERENTIAL
HCT: 42 (ref 41–53)
Hemoglobin: 13.8 (ref 13.5–17.5)
Neutrophils Absolute: 2588
Platelets: 133 K/uL — AB (ref 150–400)
WBC: 5.7

## 2023-11-11 LAB — HEPATIC FUNCTION PANEL
ALT: 10 U/L (ref 10–40)
AST: 15 (ref 14–40)
Alkaline Phosphatase: 56 (ref 25–125)
Bilirubin, Total: 0.4

## 2023-11-11 LAB — CBC: RBC: 4.42 (ref 3.87–5.11)

## 2023-11-23 ENCOUNTER — Other Ambulatory Visit: Payer: Self-pay | Admitting: Student

## 2023-11-26 ENCOUNTER — Other Ambulatory Visit: Payer: Self-pay | Admitting: *Deleted

## 2023-11-26 DIAGNOSIS — F419 Anxiety disorder, unspecified: Secondary | ICD-10-CM

## 2023-11-26 MED ORDER — DIAZEPAM 5 MG PO TABS: 5.0000 mg | ORAL_TABLET | Freq: Every day | ORAL | 5 refills | Status: DC

## 2023-11-26 NOTE — Telephone Encounter (Signed)
 [facility] Requesting a refill via physician communication sheet per Alycia Babcock Rx and sent to [place] for approval

## 2023-11-26 NOTE — Telephone Encounter (Signed)
 When should patient schedule follow up?   Patient is requesting a refill of the following medications: Requested Prescriptions   Pending Prescriptions Disp Refills   diazepam  (VALIUM ) 5 MG tablet [Pharmacy Med Name: diazePAM  5 MG Tablet] 45 tablet 4    Sig: TAKE 1 TABLET BY MOUTH ONCE DAILY;TAKE 1/2 TABLET (2.5MG ) BY MOUTH EVERY FOUR HOURS AS NEEDED ANXIETY    Date of last refill:08/23/2023   Refill amount: 30  Treatment agreement date: No, and we need you to advise when patient to be seen again

## 2023-12-19 DIAGNOSIS — L905 Scar conditions and fibrosis of skin: Secondary | ICD-10-CM | POA: Diagnosis not present

## 2023-12-19 DIAGNOSIS — L570 Actinic keratosis: Secondary | ICD-10-CM | POA: Diagnosis not present

## 2023-12-19 DIAGNOSIS — L821 Other seborrheic keratosis: Secondary | ICD-10-CM | POA: Diagnosis not present

## 2023-12-19 DIAGNOSIS — L814 Other melanin hyperpigmentation: Secondary | ICD-10-CM | POA: Diagnosis not present

## 2024-01-14 ENCOUNTER — Encounter: Payer: Self-pay | Admitting: Adult Health

## 2024-01-14 ENCOUNTER — Non-Acute Institutional Stay: Payer: Self-pay | Admitting: Adult Health

## 2024-01-14 DIAGNOSIS — J9621 Acute and chronic respiratory failure with hypoxia: Secondary | ICD-10-CM | POA: Diagnosis not present

## 2024-01-14 DIAGNOSIS — M545 Low back pain, unspecified: Secondary | ICD-10-CM | POA: Diagnosis not present

## 2024-01-14 DIAGNOSIS — G309 Alzheimer's disease, unspecified: Secondary | ICD-10-CM

## 2024-01-14 DIAGNOSIS — R0989 Other specified symptoms and signs involving the circulatory and respiratory systems: Secondary | ICD-10-CM | POA: Diagnosis not present

## 2024-01-14 DIAGNOSIS — F419 Anxiety disorder, unspecified: Secondary | ICD-10-CM | POA: Diagnosis not present

## 2024-01-14 DIAGNOSIS — I480 Paroxysmal atrial fibrillation: Secondary | ICD-10-CM

## 2024-01-14 DIAGNOSIS — F02818 Dementia in other diseases classified elsewhere, unspecified severity, with other behavioral disturbance: Secondary | ICD-10-CM | POA: Diagnosis not present

## 2024-01-14 DIAGNOSIS — R509 Fever, unspecified: Secondary | ICD-10-CM | POA: Diagnosis not present

## 2024-01-14 LAB — CBC: RBC: 4.55 (ref 3.87–5.11)

## 2024-01-14 LAB — BASIC METABOLIC PANEL WITH GFR
BUN: 21 (ref 4–21)
CO2: 27 — AB (ref 13–22)
Chloride: 104 (ref 99–108)
Creatinine: 0.9 (ref 0.6–1.3)
Glucose: 126
Potassium: 3.9 meq/L (ref 3.5–5.1)
Sodium: 141 (ref 137–147)

## 2024-01-14 LAB — CBC AND DIFFERENTIAL
HCT: 42 (ref 41–53)
Hemoglobin: 14 (ref 13.5–17.5)
Neutrophils Absolute: 9788
Platelets: 163 K/uL (ref 150–400)
WBC: 12.5

## 2024-01-14 LAB — COMPREHENSIVE METABOLIC PANEL WITH GFR
Calcium: 9.7 (ref 8.7–10.7)
eGFR: 82

## 2024-01-14 MED ORDER — TRAMADOL HCL 50 MG PO TABS: 50.0000 mg | ORAL_TABLET | Freq: Two times a day (BID) | ORAL | 0 refills | Status: DC | PRN

## 2024-01-14 NOTE — Progress Notes (Signed)
 Location:  Other [facility]) Nursing Home Room Number: [facility] 103-P Place of Service:  ALF (13) [facility] Provider:  Medina-Vargas, Jiyah Torpey, DNP, FNP-BC  Patient Care Team: Abdul Fine, MD as PCP - General [facility])  Extended Emergency Contact Information Primary Emergency Contact: [name],[name] Address: [address]          [place], [place] [address] [place] Home Phone: [phone] Mobile Phone: [phone] Relation: Daughter Secondary Emergency Contact: Therman Pao  [place] Mobile Phone: [phone] Relation: Daughter  Code Status:  DNR  Goals of care: Advanced Directive information    01/14/2024    1:26 PM  Advanced Directives  Does Patient Have a Medical Advance Directive? Yes  Type of Advance Directive Healthcare Power of Attorney  Does patient want to make changes to medical advance directive? No - Patient declined  Copy of Healthcare Power of Attorney in Chart? Yes - validated most recent copy scanned in chart (See row information)     Chief Complaint  Patient presents with   Back Pain    low back pain and low o2 sat    HPI:  Pt is a 89 y.o. male seen today for  an acute visit regarding low back pain. He is a resident of [facility]. He was reported to have low back pain. He has dementia and currently takes Donepezil  daily. He was noted to have O2 sat of 88% on room air. No shortness of breath noted.   He has atrial fibrillation and takes Eliquis  5 mg BID.   He takes Valium   5 mg in the afternoon and Valium  2.5 mg every 4 hours PRN for anxiety.   Past Medical History:  Diagnosis Date   Alzheimer's dementia (HCC)    ALZHEIMERS   Atrial fibrillation (HCC)    REVEAL DEVICE MEDTRONIC SERIAL #[id number] S MODEL #[id number]   Coronary atherosclerosis of native coronary artery    MI 2014   GERD (gastroesophageal reflux disease)    REFLUX   Gout    History of prostate  cancer    HTN (hypertension)    LPRD (laryngopharyngeal reflux disease)    Osteoarthritis, generalized    DJD neck and spine   RBBB    RBBB (right bundle branch block)    Past Surgical History:  Procedure Laterality Date   ABDOMINAL ADHESION SURGERY     APPENDECTOMY     DIRECT LARYNGOSCOPY Left 04/19/2016   Procedure: DIRECT MICROSCOPIC LARYNGOSCOPY WITH BIOPSY;  Surgeon: Deward Argue, MD;  Location: [facility];  Service: ENT;  Laterality: Left;   EP IMPLANTABLE DEVICE     Reveal device Medtronic Serial #[id number]  Model #[id number]   INGUINAL HERNIA REPAIR Left    KNEE SURGERY     PROSTATECTOMY     SHOULDER ARTHROSCOPY     TREATMENT FISTULA ANAL      Allergies  Allergen Reactions   Amiodarone Other (See Comments)    Trouble waking/falls   Multaq [Dronedarone] Other (See Comments)    unknown   Oxycodone     Albuterol Palpitations    Outpatient Encounter Medications as of 01/14/2024  Medication Sig   acetaminophen  (TYLENOL ) 500 MG tablet Take 1,000 mg by mouth daily as needed for fever or mild pain.   acetaminophen  (TYLENOL ) 500 MG tablet Take 1,000 mg by mouth 3 (three) times daily.   amoxicillin (AMOXIL) 500 MG capsule Take 2,000 mg by mouth daily as needed. Give 1 hour prior to dental  procedure   apixaban  (ELIQUIS ) 5 MG TABS tablet Take 1 tablet (5 mg total) by mouth 2 (two) times daily.   benzocaine (ORAJEL) 10 % mucosal gel Use as directed 1 Application in the mouth or throat every 6 (six) hours as needed for mouth pain.   bisacodyl (DULCOLAX) 10 MG suppository Place 10 mg rectally daily as needed for moderate constipation.   diazepam  (VALIUM ) 5 MG tablet Take 0.5 tablets (2.5 mg total) by mouth every 4 (four) hours as needed for anxiety.   diazepam  (VALIUM ) 5 MG tablet Take 1 tablet (5 mg total) by mouth daily in the afternoon.   diclofenac  Sodium (VOLTAREN ) 1 % GEL Apply 2 g topically 4 (four) times daily as needed (NECK PAIN).   donepezil  (ARICEPT ) 5 MG tablet  Take 5 mg by mouth at bedtime.   Emollient (CERAVE DAILY MOISTURIZING) LOTN Apply topically 2 (two) times daily.   guaiFENesin (ROBITUSSIN) 100 MG/5ML liquid Take 10 mLs by mouth every 4 (four) hours as needed for cough or to loosen phlegm.   melatonin 3 MG TABS tablet Take 3 mg by mouth at bedtime.   memantine  (NAMENDA ) 10 MG tablet Take 10 mg by mouth 2 (two) times daily.   Multiple Vitamins-Minerals (THERAGRAN-M PO) Take 1 tablet by mouth daily.   nystatin (MYCOSTATIN/NYSTOP) powder Apply 1 Application topically every 12 (twelve) hours as needed.   omeprazole (PRILOSEC) 20 MG capsule Take 20 mg by mouth at bedtime.   polyethylene glycol (MIRALAX  / GLYCOLAX ) 17 g packet Take 17 g by mouth every other day. Scheduled and 1 scoop as needed for constipation daily in liquid of choice   senna-docusate (SENOKOT-S) 8.6-50 MG tablet Take 2 tablets by mouth at bedtime.   SUNSCREENS EX Apply 1 application  topically as needed (Apply to exposed areas topically as needed for prophylaxis during sun exposure).   traMADol  (ULTRAM ) 50 MG tablet Take 1 tablet (50 mg total) by mouth every 12 (twelve) hours as needed for moderate pain (pain score 4-6) or severe pain (pain score 7-10) (traMADol  HCl Ora Give 1 tablet by mouth every 12 hours as needed for moderate to severe pain).   Zinc  Oxide (TRIPLE PASTE SP EX) Apply 1 application  topically as needed (Apply to groin, buttocks topically as needed for redness or irritation every shift).   [DISCONTINUED] traMADol  (ULTRAM ) 50 MG tablet Take 50 mg by mouth every 12 (twelve) hours as needed for moderate pain (pain score 4-6) or severe pain (pain score 7-10) (traMADol  HCl Ora Give 1 tablet by mouth every 12 hours as needed for moderate to severe pain).   diazepam  (VALIUM ) 5 MG tablet TAKE 1 TABLET BY MOUTH ONCE DAILY;TAKE 1/2 TABLET (2.5MG ) BY MOUTH EVERY FOUR HOURS AS NEEDED ANXIETY (Patient not taking: Reported on 01/14/2024)   No facility-administered encounter  medications on file as of 01/14/2024.    Review of Systems  Unable to obtain due to dementia.    Immunization History  Administered Date(s) Administered   Influenza-Unspecified 05/02/2015, 04/09/2019   Moderna Sars-Covid-2 Vaccination 07/12/2019, 05/11/2020   Pneumococcal Conjugate-13 01/31/2019   Td 04/20/2002   Tdap 12/08/2008   Zoster, Live 11/12/2011   Pertinent  Health Maintenance Due  Topic Date Due   INFLUENZA VACCINE  01/31/2024      06/21/2022    8:00 AM 06/21/2022   10:00 PM 06/22/2022   11:00 AM 11/01/2022    8:42 AM 11/07/2023   10:20 AM  Fall Risk  Falls in the past year?  1 1  Was there an injury with Fall?    0 0  Fall Risk Category Calculator    2 2  (RETIRED) Patient Fall Risk Level High fall risk  High fall risk  High fall risk     Patient at Risk for Falls Due to    Impaired mobility;History of fall(s);Impaired balance/gait History of fall(s)  Fall risk Follow up    Falls prevention discussed;Education provided Falls evaluation completed     Data saved with a previous flowsheet row definition     Vitals:   01/14/24 1342 01/14/24 1536  BP: 128/74   Pulse: 96   Resp: 16   Temp: 97.8 F (36.6 C)   SpO2: (!) 88% (!) 89%  Weight: 156 lb 3.2 oz (70.9 kg)    Body mass index is 26.81 kg/m.  Physical Exam Constitutional:      General: He is not in acute distress.    Appearance: Normal appearance.  HENT:     Head: Normocephalic and atraumatic.     Mouth/Throat:     Mouth: Mucous membranes are moist.  Eyes:     Conjunctiva/sclera: Conjunctivae normal.  Cardiovascular:     Rate and Rhythm: Normal rate and regular rhythm.     Pulses: Normal pulses.     Heart sounds: Normal heart sounds.  Pulmonary:     Effort: Pulmonary effort is normal.     Breath sounds: Normal breath sounds.  Abdominal:     General: Bowel sounds are normal.     Palpations: Abdomen is soft.  Musculoskeletal:        General: No swelling.     Cervical back: Normal range of  motion.  Skin:    General: Skin is warm and dry.  Psychiatric:        Mood and Affect: Mood normal.        Behavior: Behavior normal.     Labs reviewed: Recent Labs    05/06/23 0000  NA 140  K 3.8  CL 105  CO2 28*  BUN 22*  CREATININE 0.9  CALCIUM 9.5   Recent Labs    05/06/23 0000  AST 17  ALT 9*  ALKPHOS 47  ALBUMIN 4.2   Recent Labs    05/06/23 0000  WBC 5.4  NEUTROABS 2,727.00  HGB 14.1  HCT 42  PLT 134*   No results found for: TSH No results found for: HGBA1C Lab Results  Component Value Date   CHOL 178 05/15/2016   HDL 53 05/15/2016   LDLCALC 104 (H) 05/15/2016   TRIG 106 05/15/2016   CHOLHDL 3.4 05/15/2016    Significant Diagnostic Results in last 30 days:  No results found.  Assessment/Plan  1. Acute midline low back pain without sciatica (Primary) -  will start on Tramadol  50 mg 1 tab Q 12 hours PRN -  O2 sat 88%, low, will start O2 at 2L/min via [place] PRN -  stat chest x-ray, CBC and BMP, UA with CS  2. Paroxysmal atrial fibrillation (HCC) -  rate-controlled - continue Eliquis  5 mg BID for anticoagulation  3. Alzheimer's dementia with behavioral disturbance (HCC) -  continue Aricept  5 mg daiy -  continue supportive care at memory care unit  4. Anxiety -  continue Valium  5 mg in the afternoon -  continue Valium  2.5 mg Q 4 hours PRN     Family/ staff Communication: Discussed plan of care with charge nurse.  Labs/tests ordered:   stat chest x-ray, CBC and  BMP, UA with CS    Jereld Serum, DNP, MSN, FNP-BC [facility] [phone] (Monday-Friday 8:00 a.m. - 5:00 p.m.) [id number] (after hours)

## 2024-01-15 ENCOUNTER — Non-Acute Institutional Stay: Payer: Self-pay | Admitting: Student

## 2024-01-15 ENCOUNTER — Encounter: Payer: Self-pay | Admitting: Student

## 2024-01-15 DIAGNOSIS — M545 Low back pain, unspecified: Secondary | ICD-10-CM

## 2024-01-15 NOTE — Progress Notes (Signed)
 Location:  Other [place].  Nursing Home Room Number: [facility] 103P Place of Service:  ALF [phone]) Provider:  Abdul Fine, MD  Patient Care Team: Abdul Fine, MD as PCP - General [facility])  Extended Emergency Contact Information Primary Emergency Contact: [name],[name] Address: [address]          [place], [place] [address] [place] Home Phone: [phone] Mobile Phone: [phone] Relation: Daughter Secondary Emergency Contact: Therman Pao  [place] Phone: [phone] Relation: Daughter  Code Status:  DNR Goals of care: Advanced Directive information    01/14/2024    1:26 PM  Advanced Directives  Does Patient Have a Medical Advance Directive? Yes  Type of Advance Directive Healthcare Power of Attorney  Does patient want to make changes to medical advance directive? No - Patient declined  Copy of Healthcare Power of Attorney in Chart? Yes - validated most recent copy scanned in chart (See row information)     Chief Complaint  Patient presents with   Back Pain   Fever    Back Pain and Fever.     HPI:  Pt is a 89 y.o. male seen today for an acute visit for Back Pain and Fever. States he is doing fine, however, when attempting to raise the head of the bed he yells in pain.  Nursing state yesterday he had rigors and was in a lot of pain fever >101.  Past Medical History:  Diagnosis Date   Alzheimer's dementia (HCC)    ALZHEIMERS   Atrial fibrillation (HCC)    REVEAL DEVICE MEDTRONIC SERIAL #[id number] S MODEL #[id number]   Coronary atherosclerosis of native coronary artery    MI 2014   GERD (gastroesophageal reflux disease)    REFLUX   Gout    History of prostate cancer    HTN (hypertension)    LPRD (laryngopharyngeal reflux disease)    Osteoarthritis, generalized    DJD neck and spine   RBBB    RBBB (right bundle branch block)    Past Surgical History:  Procedure Laterality Date   ABDOMINAL  ADHESION SURGERY     APPENDECTOMY     DIRECT LARYNGOSCOPY Left 04/19/2016   Procedure: DIRECT MICROSCOPIC LARYNGOSCOPY WITH BIOPSY;  Surgeon: Deward Argue, MD;  Location: [facility];  Service: ENT;  Laterality: Left;   EP IMPLANTABLE DEVICE     Reveal device Medtronic Serial #[id number]  Model #[id number]   INGUINAL HERNIA REPAIR Left    KNEE SURGERY     PROSTATECTOMY     SHOULDER ARTHROSCOPY     TREATMENT FISTULA ANAL      Allergies  Allergen Reactions   Amiodarone Other (See Comments)    Trouble waking/falls   Multaq [Dronedarone] Other (See Comments)    unknown   Oxycodone     Albuterol Palpitations    Outpatient Encounter Medications as of 01/15/2024  Medication Sig   acetaminophen  (TYLENOL ) 500 MG tablet Take 1,000 mg by mouth daily as needed for fever or mild pain.   acetaminophen  (TYLENOL ) 500 MG tablet Take 1,000 mg by mouth 3 (three) times daily.   amoxicillin (AMOXIL) 500 MG capsule Take 2,000 mg by mouth daily as needed. Give 1 hour prior to dental procedure   apixaban  (ELIQUIS ) 5 MG TABS tablet Take 1 tablet (5 mg total) by mouth 2 (two) times daily.   benzocaine (ORAJEL) 10 % mucosal gel Use as directed 1 Application in the mouth or throat every 6 (six) hours as needed for mouth  pain.   bisacodyl (DULCOLAX) 10 MG suppository Place 10 mg rectally daily as needed for moderate constipation.   diazepam  (VALIUM ) 5 MG tablet Take 0.5 tablets (2.5 mg total) by mouth every 4 (four) hours as needed for anxiety.   diazepam  (VALIUM ) 5 MG tablet TAKE 1 TABLET BY MOUTH ONCE DAILY;TAKE 1/2 TABLET (2.5MG ) BY MOUTH EVERY FOUR HOURS AS NEEDED ANXIETY   diazepam  (VALIUM ) 5 MG tablet Take 1 tablet (5 mg total) by mouth daily in the afternoon.   diclofenac  Sodium (VOLTAREN ) 1 % GEL Apply 2 g topically 4 (four) times daily as needed (NECK PAIN).   donepezil  (ARICEPT ) 5 MG tablet Take 5 mg by mouth at bedtime.   Emollient (CERAVE DAILY MOISTURIZING) LOTN Apply topically 2 (two) times  daily.   guaiFENesin (ROBITUSSIN) 100 MG/5ML liquid Take 10 mLs by mouth every 4 (four) hours as needed for cough or to loosen phlegm.   melatonin 3 MG TABS tablet Take 3 mg by mouth at bedtime.   memantine  (NAMENDA ) 10 MG tablet Take 10 mg by mouth 2 (two) times daily.   Multiple Vitamins-Minerals (THERAGRAN-M PO) Take 1 tablet by mouth daily.   nystatin (MYCOSTATIN/NYSTOP) powder Apply 1 Application topically every 12 (twelve) hours as needed.   omeprazole (PRILOSEC) 20 MG capsule Take 20 mg by mouth at bedtime.   polyethylene glycol (MIRALAX  / GLYCOLAX ) 17 g packet Take 17 g by mouth every other day. Scheduled and 1 scoop as needed for constipation daily in liquid of choice   senna-docusate (SENOKOT-S) 8.6-50 MG tablet Take 2 tablets by mouth at bedtime.   SUNSCREENS EX Apply 1 application  topically as needed (Apply to exposed areas topically as needed for prophylaxis during sun exposure).   traMADol  (ULTRAM ) 50 MG tablet Take 1 tablet (50 mg total) by mouth every 12 (twelve) hours as needed for moderate pain (pain score 4-6) or severe pain (pain score 7-10) (traMADol  HCl Ora Give 1 tablet by mouth every 12 hours as needed for moderate to severe pain).   Zinc  Oxide (TRIPLE PASTE SP EX) Apply 1 application  topically as needed (Apply to groin, buttocks topically as needed for redness or irritation every shift).   No facility-administered encounter medications on file as of 01/15/2024.    Review of Systems  Immunization History  Administered Date(s) Administered   Influenza-Unspecified 05/02/2015, 04/09/2019   Moderna Sars-Covid-2 Vaccination 07/12/2019, 05/11/2020   Pneumococcal Conjugate-13 01/31/2019   Td 04/20/2002   Tdap 12/08/2008   Zoster, Live 11/12/2011   Pertinent  Health Maintenance Due  Topic Date Due   INFLUENZA VACCINE  01/31/2024      06/21/2022    8:00 AM 06/21/2022   10:00 PM 06/22/2022   11:00 AM 11/01/2022    8:42 AM 11/07/2023   10:20 AM  Fall Risk  Falls in  the past year?    1 1  Was there an injury with Fall?    0 0  Fall Risk Category Calculator    2 2  (RETIRED) Patient Fall Risk Level High fall risk  High fall risk  High fall risk     Patient at Risk for Falls Due to    Impaired mobility;History of fall(s);Impaired balance/gait History of fall(s)  Fall risk Follow up    Falls prevention discussed;Education provided Falls evaluation completed     Data saved with a previous flowsheet row definition   Functional Status Survey:    Vitals:   01/15/24 0853  BP: 128/74  Pulse: 96  Resp: 16  Temp: 97.8 F (36.6 C)  SpO2: 93%  Weight: 156 lb 3.2 oz (70.9 kg)  Height: 5' 4 (1.626 m)   Body mass index is 26.81 kg/m. Physical Exam Cardiovascular:     Rate and Rhythm: Normal rate.  Pulmonary:     Effort: Pulmonary effort is normal.  Abdominal:     General: Abdomen is flat.     Palpations: Abdomen is soft.  Musculoskeletal:     Comments: Unable to fully assess secondary to pain  Skin:    General: Skin is warm and dry.  Neurological:     Mental Status: He is alert. Mental status is at baseline.     Labs reviewed: Recent Labs    05/06/23 0000 11/11/23 0000  NA 140 142  K 3.8 3.9  CL 105 105  CO2 28* 32*  BUN 22* 23*  CREATININE 0.9 0.9  CALCIUM 9.5 9.4   Recent Labs    05/06/23 0000 11/11/23 0000  AST 17 15  ALT 9* 10  ALKPHOS 47 56  ALBUMIN 4.2 4.0   Recent Labs    05/06/23 0000 11/11/23 0000  WBC 5.4 5.7  NEUTROABS 2,727.00 2,588.00  HGB 14.1 13.8  HCT 42 42  PLT 134* 133*   No results found for: TSH No results found for: HGBA1C Lab Results  Component Value Date   CHOL 178 05/15/2016   HDL 53 05/15/2016   LDLCALC 104 (H) 05/15/2016   TRIG 106 05/15/2016   CHOLHDL 3.4 05/15/2016    Significant Diagnostic Results in last 30 days:  No results found.  Assessment/Plan Acute midline low back pain without sciatica Follow up from initial visit yesterday. Unclear cause of back pain. No specific  MOI. Labs collected. Will give empiric ceftriaxone  1 g q24 hours for potential UTI. Will adjust as needed based on final culture results.  Family/ staff Communication: nursing   Labs/tests ordered:  none

## 2024-01-16 DIAGNOSIS — J9621 Acute and chronic respiratory failure with hypoxia: Secondary | ICD-10-CM | POA: Diagnosis not present

## 2024-01-16 DIAGNOSIS — R509 Fever, unspecified: Secondary | ICD-10-CM | POA: Diagnosis not present

## 2024-01-19 ENCOUNTER — Encounter: Payer: Self-pay | Admitting: Student

## 2024-01-20 ENCOUNTER — Non-Acute Institutional Stay: Payer: Self-pay | Admitting: Student

## 2024-01-20 ENCOUNTER — Encounter: Payer: Self-pay | Admitting: Student

## 2024-01-20 DIAGNOSIS — M545 Low back pain, unspecified: Secondary | ICD-10-CM

## 2024-01-20 MED ORDER — TRAMADOL HCL 50 MG PO TABS: 50.0000 mg | ORAL_TABLET | ORAL | 0 refills | Status: DC

## 2024-01-20 NOTE — Progress Notes (Unsigned)
 Location:  Other [facility]) Nursing Home Room Number: [facility] 103-P Place of Service:  ALF (13) [facility])) Provider:  Abdul Fine, MD  Patient Care Team: Abdul Fine, MD as PCP - General [facility])  Extended Emergency Contact Information Primary Emergency Contact: [name],[name] Address: [address]          [place], [place] [address] [place] Home Phone: [phone] Mobile Phone: [phone] Relation: Daughter Secondary Emergency Contact: Therman Pao  [place] Mobile Phone: [phone] Relation: Daughter  Code Status:  DNR Goals of care: Advanced Directive information    01/20/2024    9:58 AM  Advanced Directives  Does Patient Have a Medical Advance Directive? Yes  Type of Estate agent of [place];Out of facility DNR (pink MOST or yellow form);Living will  Does patient want to make changes to medical advance directive? No - Patient declined  Copy of Healthcare Power of Attorney in Chart? Yes - validated most recent copy scanned in chart (See row information)     Chief Complaint  Patient presents with  . Back Pain    Back Pain      HPI:  Pt is a 89 y.o. male seen today for an acute visit.  History of Present Illness The patient, with bone degenerative disease and scoliosis, presents with worsening pain. He is accompanied by a caregiver.  He has a history of bone degenerative disease and scoliosis, confirmed by x-rays. He has had increased pain over the past week, which has significantly affected his comfort and mobility. No current back pain, but discomfort is noted when attempting to sit up.  His current pain management regimen includes tramadol , previously taken every 12 hours. He has been observed trying to lean back in his wheelchair for comfort, raising concerns that his current wheelchair may be exacerbating his condition.  Last week, there was a concern for  potential pneumonia due to a drop in oxygen levels, but a chest x-ray showed no signs of pneumonia. He was treated with a three-day course of broad-spectrum antibiotics. No further infectious symptoms have been reported.  He has not experienced any recent falls, with the last incidents occurring a couple of months ago. There is no indication of fractures from recent events. No new rashes or skin issues, and no indication of recent infections beyond the previously mentioned concerns.   Results RADIOLOGY X-ray: Degenerative bone disease, scoliosis Chest X-ray: No significant findings  Past Medical History:  Diagnosis Date  . Alzheimer's dementia (HCC)    ALZHEIMERS  . Atrial fibrillation [facility])    REVEAL DEVICE MEDTRONIC SERIAL #[id number] S MODEL #[id number]  . Coronary atherosclerosis of native coronary artery    MI 2014  . GERD (gastroesophageal reflux disease)    REFLUX  . Gout   . History of prostate cancer   . HTN (hypertension)   . LPRD (laryngopharyngeal reflux disease)   . Osteoarthritis, generalized    DJD neck and spine  . RBBB   . RBBB (right bundle branch block)    Past Surgical History:  Procedure Laterality Date  . ABDOMINAL ADHESION SURGERY    . APPENDECTOMY    . DIRECT LARYNGOSCOPY Left 04/19/2016   Procedure: DIRECT MICROSCOPIC LARYNGOSCOPY WITH BIOPSY;  Surgeon: Deward Argue, MD;  Location: [facility];  Service: ENT;  Laterality: Left;  . EP IMPLANTABLE DEVICE     Reveal device Medtronic Serial [id number]  Model [id number]  . INGUINAL HERNIA REPAIR Left   . KNEE  SURGERY    . PROSTATECTOMY    . SHOULDER ARTHROSCOPY    . TREATMENT FISTULA ANAL      Allergies  Allergen Reactions  . Amiodarone Other (See Comments)    Trouble waking/falls  . Multaq [Dronedarone] Other (See Comments)    unknown  . Oxycodone    . Albuterol Palpitations    Outpatient Encounter Medications as of 01/20/2024  Medication Sig  . acetaminophen  (TYLENOL ) 500 MG tablet Take 1,000  mg by mouth daily as needed for fever or mild pain.  . acetaminophen  (TYLENOL ) 500 MG tablet Take 1,000 mg by mouth 3 (three) times daily.  SABRA amoxicillin (AMOXIL) 500 MG capsule Take 2,000 mg by mouth daily as needed. Give 1 hour prior to dental procedure  . apixaban  (ELIQUIS ) 5 MG TABS tablet Take 1 tablet (5 mg total) by mouth 2 (two) times daily.  . benzocaine (ORAJEL) 10 % mucosal gel Use as directed 1 Application in the mouth or throat every 6 (six) hours as needed for mouth pain.  . bisacodyl (DULCOLAX) 10 MG suppository Place 10 mg rectally daily as needed for moderate constipation.  . diazepam  (VALIUM ) 5 MG tablet Take 0.5 tablets (2.5 mg total) by mouth every 4 (four) hours as needed for anxiety.  . diazepam  (VALIUM ) 5 MG tablet TAKE 1 TABLET BY MOUTH ONCE DAILY;TAKE 1/2 TABLET (2.5MG ) BY MOUTH EVERY FOUR HOURS AS NEEDED ANXIETY  . diazepam  (VALIUM ) 5 MG tablet Take 1 tablet (5 mg total) by mouth daily in the afternoon.  . diclofenac  Sodium (VOLTAREN ) 1 % GEL Apply 2 g topically 4 (four) times daily as needed (NECK PAIN).  . donepezil  (ARICEPT ) 5 MG tablet Take 5 mg by mouth at bedtime.  . Emollient (CERAVE DAILY MOISTURIZING) LOTN Apply topically 2 (two) times daily.  SABRA guaiFENesin (ROBITUSSIN) 100 MG/5ML liquid Take 10 mLs by mouth every 4 (four) hours as needed for cough or to loosen phlegm.  . melatonin 3 MG TABS tablet Take 3 mg by mouth at bedtime.  . memantine  (NAMENDA ) 10 MG tablet Take 10 mg by mouth 2 (two) times daily.  . Multiple Vitamins-Minerals (THERAGRAN-M PO) Take 1 tablet by mouth daily.  SABRA nystatin (MYCOSTATIN/NYSTOP) powder Apply 1 Application topically every 12 (twelve) hours as needed.  SABRA omeprazole (PRILOSEC) 20 MG capsule Take 20 mg by mouth at bedtime.  . polyethylene glycol (MIRALAX  / GLYCOLAX ) 17 g packet Take 17 g by mouth every other day. Scheduled and 1 scoop as needed for constipation daily in liquid of choice  . senna-docusate (SENOKOT-S) 8.6-50 MG tablet  Take 2 tablets by mouth at bedtime.  . SUNSCREENS EX Apply 1 application  topically as needed (Apply to exposed areas topically as needed for prophylaxis during sun exposure).  . traMADol  (ULTRAM ) 50 MG tablet Take 1 tablet (50 mg total) by mouth every 12 (twelve) hours as needed for moderate pain (pain score 4-6) or severe pain (pain score 7-10) (traMADol  HCl Ora Give 1 tablet by mouth every 12 hours as needed for moderate to severe pain).  . Zinc  Oxide (TRIPLE PASTE SP EX) Apply 1 application  topically as needed (Apply to groin, buttocks topically as needed for redness or irritation every shift).   No facility-administered encounter medications on file as of 01/20/2024.    Review of Systems  Immunization History  Administered Date(s) Administered  . Influenza-Unspecified 05/02/2015, 04/09/2019  . Moderna Sars-Covid-2 Vaccination 07/12/2019, 05/11/2020  . Pneumococcal Conjugate-13 01/31/2019  . Td 04/20/2002  . Tdap 12/08/2008  .  Zoster, Live 11/12/2011   Pertinent  Health Maintenance Due  Topic Date Due  . INFLUENZA VACCINE  01/31/2024      06/21/2022    8:00 AM 06/21/2022   10:00 PM 06/22/2022   11:00 AM 11/01/2022    8:42 AM 11/07/2023   10:20 AM  Fall Risk  Falls in the past year?    1 1  Was there an injury with Fall?    0 0  Fall Risk Category Calculator    2 2  (RETIRED) Patient Fall Risk Level High fall risk  High fall risk  High fall risk     Patient at Risk for Falls Due to    Impaired mobility;History of fall(s);Impaired balance/gait History of fall(s)  Fall risk Follow up    Falls prevention discussed;Education provided Falls evaluation completed     Data saved with a previous flowsheet row definition   Functional Status Survey:    Vitals:   01/20/24 1005  BP: 102/64  Pulse: 96  Resp: 18  Temp: 98.9 F (37.2 C)  SpO2: 96%  Weight: 156 lb 3.2 oz (70.9 kg)  Height: 5' 4 (1.626 m)   Body mass index is 26.81 kg/m. Physical Exam Cardiovascular:     Rate  and Rhythm: Normal rate.  Pulmonary:     Effort: Pulmonary effort is normal.  Musculoskeletal:     Comments: Unable to sit up at 90 degree angle or lay flat in bed. Yells in pain with movement - remains at 45 degree angle.   Neurological:     Mental Status: He is alert. He is disoriented.     Labs reviewed: Recent Labs    05/06/23 0000 11/11/23 0000  NA 140 142  K 3.8 3.9  CL 105 105  CO2 28* 32*  BUN 22* 23*  CREATININE 0.9 0.9  CALCIUM 9.5 9.4   Recent Labs    05/06/23 0000 11/11/23 0000  AST 17 15  ALT 9* 10  ALKPHOS 47 56  ALBUMIN 4.2 4.0   Recent Labs    05/06/23 0000 11/11/23 0000  WBC 5.4 5.7  NEUTROABS 2,727.00 2,588.00  HGB 14.1 13.8  HCT 42 42  PLT 134* 133*   No results found for: TSH No results found for: HGBA1C Lab Results  Component Value Date   CHOL 178 05/15/2016   HDL 53 05/15/2016   LDLCALC 104 (H) 05/15/2016   TRIG 106 05/15/2016   CHOLHDL 3.4 05/15/2016    Significant Diagnostic Results in last 30 days:  No results found.  Assessment/Plan  Degenerative bone disease Chronic degenerative bone disease with potential acute exacerbation. Increased pain possibly due to changes in bone structure or wheelchair positioning. MRI not pursued due to sedation risks and lack of actionable outcomes. - Administer tramadol  every four hours as needed for pain management. - Refer to occupational therapy for wheelchair evaluation and alternative options to improve comfort. - Consider short course of steroids if pain is arthritic in nature and not significantly improved with tramadol .  Scoliosis Scoliosis identified on x-ray with no acute changes.  Infectious disease management Recent concerns about possible pneumonia were unfounded as chest x-ray showed no specific findings. Previous broad-spectrum antibiotic course covered potential infections including urinary tract infections and pneumonia.  Family/ staff Communication:  nursing  Labs/tests ordered:  daughter

## 2024-01-22 ENCOUNTER — Encounter: Payer: Self-pay | Admitting: Student

## 2024-01-28 DIAGNOSIS — R293 Abnormal posture: Secondary | ICD-10-CM | POA: Diagnosis not present

## 2024-01-28 DIAGNOSIS — G309 Alzheimer's disease, unspecified: Secondary | ICD-10-CM | POA: Diagnosis not present

## 2024-01-28 DIAGNOSIS — F0280 Dementia in other diseases classified elsewhere without behavioral disturbance: Secondary | ICD-10-CM | POA: Diagnosis not present

## 2024-01-28 DIAGNOSIS — M6281 Muscle weakness (generalized): Secondary | ICD-10-CM | POA: Diagnosis not present

## 2024-01-29 ENCOUNTER — Other Ambulatory Visit: Payer: Self-pay | Admitting: Student

## 2024-01-29 DIAGNOSIS — M545 Low back pain, unspecified: Secondary | ICD-10-CM

## 2024-01-29 MED ORDER — HYDROCODONE-ACETAMINOPHEN 5-325 MG PO TABS: 1.0000 | ORAL_TABLET | Freq: Three times a day (TID) | ORAL | 0 refills | Status: DC | PRN

## 2024-01-29 NOTE — Progress Notes (Signed)
 Tramadol  ineffective.

## 2024-01-30 ENCOUNTER — Non-Acute Institutional Stay: Payer: Self-pay | Admitting: Nurse Practitioner

## 2024-01-30 ENCOUNTER — Encounter: Payer: Self-pay | Admitting: Nurse Practitioner

## 2024-01-30 DIAGNOSIS — M545 Low back pain, unspecified: Secondary | ICD-10-CM | POA: Diagnosis not present

## 2024-01-30 MED ORDER — DULOXETINE HCL 30 MG PO CPEP: 30.0000 mg | ORAL_CAPSULE | Freq: Every day | ORAL | Status: DC

## 2024-01-30 NOTE — Progress Notes (Signed)
 Location:  Other [facility]) Nursing Home Room Number: [facility] 103-P Place of Service:  ALF (13) [facility] [place] ( Memory Care))  Abdul Fine, MD  Patient Care Team: Abdul Fine, MD as PCP - General [facility])  Extended Emergency Contact Information Primary Emergency Contact: [name],[name] Address: [address]          [place], [place] [address] [place] Home Phone: [phone] Mobile Phone: [phone] Relation: Daughter Secondary Emergency Contact: Therman Pao  [place] Mobile Phone: [phone] Relation: Daughter  Goals of care: Advanced Directive information    01/30/2024   11:57 AM  Advanced Directives  Does Patient Have a Medical Advance Directive? Yes  Type of Estate agent of [place];Out of facility DNR (pink MOST or yellow form);Living will  Copy of Healthcare Power of Attorney in Chart? Yes - validated most recent copy scanned in chart (See row information)     Chief Complaint  Patient presents with   Acute Visit    PAIN    HPI:  Pt is a 89 y.o. male seen today for an acute visit for ongoing pain Pt has hx of degenerative disease and scoliosis to lower back and has had increase in pain for over 2 weeks. No injury noted. Xray without acute findings.  He was placed on tramadol  and now norco but still has a lot of pain and crying out with movement.  He was placed on short course of prednisone  which did help with symptoms while on medication. Nursing requesting    Past Medical History:  Diagnosis Date   Alzheimer's dementia (HCC)    ALZHEIMERS   Atrial fibrillation (HCC)    REVEAL DEVICE MEDTRONIC SERIAL #[id number] S MODEL #[id number]   Coronary atherosclerosis of native coronary artery    MI 2014   GERD (gastroesophageal reflux disease)    REFLUX   Gout    History of prostate cancer    HTN (hypertension)    LPRD (laryngopharyngeal reflux disease)    Osteoarthritis, generalized     DJD neck and spine   RBBB    RBBB (right bundle branch block)    Past Surgical History:  Procedure Laterality Date   ABDOMINAL ADHESION SURGERY     APPENDECTOMY     DIRECT LARYNGOSCOPY Left 04/19/2016   Procedure: DIRECT MICROSCOPIC LARYNGOSCOPY WITH BIOPSY;  Surgeon: Deward Argue, MD;  Location: [facility];  Service: ENT;  Laterality: Left;   EP IMPLANTABLE DEVICE     Reveal device Medtronic Serial #[id number]  Model #[id number]   INGUINAL HERNIA REPAIR Left    KNEE SURGERY     PROSTATECTOMY     SHOULDER ARTHROSCOPY     TREATMENT FISTULA ANAL      Allergies  Allergen Reactions   Amiodarone Other (See Comments)    Trouble waking/falls   Multaq [Dronedarone] Other (See Comments)    unknown   Oxycodone     Albuterol Palpitations    Outpatient Encounter Medications as of 01/30/2024  Medication Sig   acetaminophen  (TYLENOL ) 500 MG tablet Take 1,000 mg by mouth daily as needed for fever or mild pain.   amoxicillin (AMOXIL) 500 MG capsule Take 2,000 mg by mouth daily as needed. Give 1 hour prior to dental procedure   apixaban  (ELIQUIS ) 5 MG TABS tablet Take 1 tablet (5 mg total) by mouth 2 (two) times daily.   benzocaine (ORAJEL) 10 % mucosal gel Use as directed 1 Application in the mouth or throat every 6 (six) hours as needed  for mouth pain.   bisacodyl (DULCOLAX) 10 MG suppository Place 10 mg rectally daily as needed for moderate constipation.   diazepam  (VALIUM ) 5 MG tablet Take 0.5 tablets (2.5 mg total) by mouth every 4 (four) hours as needed for anxiety.   diazepam  (VALIUM ) 5 MG tablet Take 1 tablet (5 mg total) by mouth daily in the afternoon.   diclofenac  Sodium (VOLTAREN ) 1 % GEL Apply 2 g topically 4 (four) times daily as needed (NECK PAIN).   donepezil  (ARICEPT ) 5 MG tablet Take 5 mg by mouth at bedtime.   Emollient (CERAVE DAILY MOISTURIZING) LOTN Apply topically 2 (two) times daily.   guaiFENesin (ROBITUSSIN) 100 MG/5ML liquid Take 10 mLs by mouth every 4 (four)  hours as needed for cough or to loosen phlegm.   HYDROcodone -acetaminophen  (NORCO/VICODIN) 5-325 MG tablet Take 1 tablet by mouth every 8 (eight) hours as needed for severe pain (pain score 7-10).   melatonin 3 MG TABS tablet Take 3 mg by mouth at bedtime.   memantine  (NAMENDA ) 10 MG tablet Take 10 mg by mouth 2 (two) times daily.   Multiple Vitamins-Minerals (THERAGRAN-M PO) Take 1 tablet by mouth daily.   nystatin (MYCOSTATIN/NYSTOP) powder Apply 1 Application topically every 12 (twelve) hours as needed.   omeprazole (PRILOSEC) 20 MG capsule Take 20 mg by mouth at bedtime.   polyethylene glycol (MIRALAX  / GLYCOLAX ) 17 g packet Take 17 g by mouth every other day. Scheduled and 1 scoop as needed for constipation daily in liquid of choice   senna-docusate (SENOKOT-S) 8.6-50 MG tablet Take 2 tablets by mouth at bedtime.   SUNSCREENS EX Apply 1 application  topically as needed (Apply to exposed areas topically as needed for prophylaxis during sun exposure).   Zinc  Oxide (TRIPLE PASTE SP EX) Apply 1 application  topically as needed (Apply to groin, buttocks topically as needed for redness or irritation every shift).   acetaminophen  (TYLENOL ) 500 MG tablet Take 1,000 mg by mouth 3 (three) times daily. (Patient not taking: Reported on 01/30/2024)   No facility-administered encounter medications on file as of 01/30/2024.    Review of Systems  Unable to perform ROS: Dementia    Immunization History  Administered Date(s) Administered   Influenza-Unspecified 05/02/2015, 04/09/2019   Moderna Sars-Covid-2 Vaccination 07/12/2019, 05/11/2020   Pneumococcal Conjugate-13 01/31/2019   Td 04/20/2002   Tdap 12/08/2008   Zoster, Live 11/12/2011   Pertinent  Health Maintenance Due  Topic Date Due   INFLUENZA VACCINE  01/31/2024      06/21/2022    8:00 AM 06/21/2022   10:00 PM 06/22/2022   11:00 AM 11/01/2022    8:42 AM 11/07/2023   10:20 AM  Fall Risk  Falls in the past year?    1 1  Was there an  injury with Fall?    0 0  Fall Risk Category Calculator    2 2  (RETIRED) Patient Fall Risk Level High fall risk  High fall risk  High fall risk     Patient at Risk for Falls Due to    Impaired mobility;History of fall(s);Impaired balance/gait History of fall(s)  Fall risk Follow up    Falls prevention discussed;Education provided Falls evaluation completed     Data saved with a previous flowsheet row definition   Functional Status Survey:    Vitals:   01/30/24 1156  BP: 119/69  Pulse: 93  Resp: (!) 21  Temp: 97.9 F (36.6 C)  SpO2: 96%  Weight: 156 lb 3.2 oz (70.9  kg)  Height: 5' 4 (1.626 m)   Body mass index is 26.81 kg/m. Physical Exam Constitutional:      Appearance: Normal appearance.  Musculoskeletal:     Comments: Limited ROM to bilateral hips but without significant pain with movement.  No pain or discomfort with passive ROM to bilateral knees or ankles.  Cries out when moved to side in bed  Neurological:     Mental Status: He is alert. Mental status is at baseline.  Psychiatric:        Mood and Affect: Mood normal.     Labs reviewed: Recent Labs    05/06/23 0000 11/11/23 0000  NA 140 142  K 3.8 3.9  CL 105 105  CO2 28* 32*  BUN 22* 23*  CREATININE 0.9 0.9  CALCIUM 9.5 9.4   Recent Labs    05/06/23 0000 11/11/23 0000  AST 17 15  ALT 9* 10  ALKPHOS 47 56  ALBUMIN 4.2 4.0   Recent Labs    05/06/23 0000 11/11/23 0000  WBC 5.4 5.7  NEUTROABS 2,727.00 2,588.00  HGB 14.1 13.8  HCT 42 42  PLT 134* 133*   No results found for: TSH No results found for: HGBA1C Lab Results  Component Value Date   CHOL 178 05/15/2016   HDL 53 05/15/2016   LDLCALC 104 (H) 05/15/2016   TRIG 106 05/15/2016   CHOLHDL 3.4 05/15/2016    Significant Diagnostic Results in last 30 days:  No results found.  Assessment/Plan  1. Acute midline low back pain without sciatica (Primary) Will restart prednisone  40 mg daily for 5 day then 20 mg daily for 2 Start  cymbalta  30 mg daily for pain Dc tylenol  since taking norco so he will not exceed dosing.  May have to increase norco for adequate pain management.    Shown Dissinger K. Caro BODILY [facility] [phone]

## 2024-01-31 DIAGNOSIS — G309 Alzheimer's disease, unspecified: Secondary | ICD-10-CM | POA: Diagnosis not present

## 2024-01-31 DIAGNOSIS — M6281 Muscle weakness (generalized): Secondary | ICD-10-CM | POA: Diagnosis not present

## 2024-01-31 DIAGNOSIS — R293 Abnormal posture: Secondary | ICD-10-CM | POA: Diagnosis not present

## 2024-01-31 DIAGNOSIS — F0280 Dementia in other diseases classified elsewhere without behavioral disturbance: Secondary | ICD-10-CM | POA: Diagnosis not present

## 2024-02-07 ENCOUNTER — Other Ambulatory Visit: Payer: Self-pay | Admitting: Student

## 2024-02-07 DIAGNOSIS — M545 Low back pain, unspecified: Secondary | ICD-10-CM

## 2024-02-07 DIAGNOSIS — R293 Abnormal posture: Secondary | ICD-10-CM | POA: Diagnosis not present

## 2024-02-07 DIAGNOSIS — G309 Alzheimer's disease, unspecified: Secondary | ICD-10-CM | POA: Diagnosis not present

## 2024-02-07 DIAGNOSIS — M6281 Muscle weakness (generalized): Secondary | ICD-10-CM | POA: Diagnosis not present

## 2024-02-07 DIAGNOSIS — F0280 Dementia in other diseases classified elsewhere without behavioral disturbance: Secondary | ICD-10-CM | POA: Diagnosis not present

## 2024-02-07 NOTE — Telephone Encounter (Signed)
 [facility] requested refill [facility] Resident Pended and sent to Dr. Abdul for approval.

## 2024-02-10 ENCOUNTER — Non-Acute Institutional Stay: Payer: Self-pay | Admitting: Student

## 2024-02-10 DIAGNOSIS — R634 Abnormal weight loss: Secondary | ICD-10-CM

## 2024-02-10 DIAGNOSIS — F02818 Dementia in other diseases classified elsewhere, unspecified severity, with other behavioral disturbance: Secondary | ICD-10-CM | POA: Diagnosis not present

## 2024-02-10 DIAGNOSIS — I10 Essential (primary) hypertension: Secondary | ICD-10-CM

## 2024-02-10 DIAGNOSIS — G309 Alzheimer's disease, unspecified: Secondary | ICD-10-CM

## 2024-02-10 DIAGNOSIS — R0989 Other specified symptoms and signs involving the circulatory and respiratory systems: Secondary | ICD-10-CM | POA: Diagnosis not present

## 2024-02-10 DIAGNOSIS — I4891 Unspecified atrial fibrillation: Secondary | ICD-10-CM | POA: Diagnosis not present

## 2024-02-10 DIAGNOSIS — M545 Low back pain, unspecified: Secondary | ICD-10-CM | POA: Diagnosis not present

## 2024-02-10 DIAGNOSIS — R509 Fever, unspecified: Secondary | ICD-10-CM | POA: Diagnosis not present

## 2024-02-10 LAB — BASIC METABOLIC PANEL WITH GFR
BUN: 23 — AB (ref 4–21)
CO2: 28 — AB (ref 13–22)
Chloride: 101 (ref 99–108)
Creatinine: 0.8 (ref 0.6–1.3)
Glucose: 142
Potassium: 4.6 meq/L (ref 3.5–5.1)
Sodium: 141 (ref 137–147)

## 2024-02-10 LAB — COMPREHENSIVE METABOLIC PANEL WITH GFR
Calcium: 9.6 (ref 8.7–10.7)
eGFR: 83

## 2024-02-10 LAB — CBC AND DIFFERENTIAL
HCT: 49 (ref 41–53)
Hemoglobin: 15.7 (ref 13.5–17.5)
Neutrophils Absolute: 12104
Platelets: 227 K/uL (ref 150–400)
WBC: 17

## 2024-02-10 LAB — CBC: RBC: 5.24 — AB (ref 3.87–5.11)

## 2024-02-12 DIAGNOSIS — M6281 Muscle weakness (generalized): Secondary | ICD-10-CM | POA: Diagnosis not present

## 2024-02-12 DIAGNOSIS — R293 Abnormal posture: Secondary | ICD-10-CM | POA: Diagnosis not present

## 2024-02-12 DIAGNOSIS — F0280 Dementia in other diseases classified elsewhere without behavioral disturbance: Secondary | ICD-10-CM | POA: Diagnosis not present

## 2024-02-12 DIAGNOSIS — G309 Alzheimer's disease, unspecified: Secondary | ICD-10-CM | POA: Diagnosis not present

## 2024-02-13 DIAGNOSIS — R5381 Other malaise: Secondary | ICD-10-CM | POA: Diagnosis not present

## 2024-02-13 DIAGNOSIS — R509 Fever, unspecified: Secondary | ICD-10-CM | POA: Diagnosis not present

## 2024-02-13 DIAGNOSIS — D72829 Elevated white blood cell count, unspecified: Secondary | ICD-10-CM | POA: Diagnosis not present

## 2024-02-13 DIAGNOSIS — R109 Unspecified abdominal pain: Secondary | ICD-10-CM | POA: Diagnosis not present

## 2024-02-13 DIAGNOSIS — R188 Other ascites: Secondary | ICD-10-CM | POA: Diagnosis not present

## 2024-02-14 DIAGNOSIS — M79605 Pain in left leg: Secondary | ICD-10-CM | POA: Diagnosis not present

## 2024-02-14 DIAGNOSIS — M79604 Pain in right leg: Secondary | ICD-10-CM | POA: Diagnosis not present

## 2024-02-16 ENCOUNTER — Encounter: Payer: Self-pay | Admitting: Student

## 2024-02-16 NOTE — Progress Notes (Signed)
 Location:  Other Nursing Home Room Number: [facility] 103 Place of Service:  ALF [phone]) Provider:  Abdul Abdul, Richerd, MD  Patient Care Team: Abdul Richerd, MD as PCP - General [facility])  Extended Emergency Contact Information Primary Emergency Contact: [name],[name] Address: [address]          [place], [place] [address] [place] Home Phone: [phone] Mobile Phone: [phone] Relation: Daughter Secondary Emergency Contact: Therman Pao  [place] Mobile Phone: [phone] Relation: Daughter  Code Status:  DNR Goals of care: Advanced Directive information    01/30/2024   11:57 AM  Advanced Directives  Does Patient Have a Medical Advance Directive? Yes  Type of Estate agent of [place];Out of facility DNR (pink MOST or yellow form);Living will  Copy of Healthcare Power of Attorney in Chart? Yes - validated most recent copy scanned in chart (See row information)     Chief Complaint  Patient presents with   Acute Visit    HPI:  Pt is a 89 y.o. male seen today for an acute visit for  Discussed the use of AI scribe software for clinical note transcription with the patient, who gave verbal consent to proceed.  History of Present Illness   History of Present Illness The patient, with Alzheimer's disease and osteoarthritis, presents with significant decline in the last month, including increased pain, decreased food intake, and weight loss.  Over the past month, he has experienced increased pain, decreased food intake, and a six-pound weight loss.  His medical history includes Alzheimer's disease, atrial fibrillation, osteoarthritis, chronic gout, essential primary hypertension, and a remote history of prostate cancer, which is not currently being monitored.  He manages anxiety with Valium  and pain with Norco. He has an implantable loop recorder and has expressed a do not resuscitate  preference.  Past Medical History:  Diagnosis Date   Alzheimer's dementia (HCC)    ALZHEIMERS   Atrial fibrillation (HCC)    REVEAL DEVICE MEDTRONIC SERIAL #[id number] S MODEL #[id number]   Coronary atherosclerosis of native coronary artery    MI 2014   GERD (gastroesophageal reflux disease)    REFLUX   Gout    History of prostate cancer    HTN (hypertension)    LPRD (laryngopharyngeal reflux disease)    Osteoarthritis, generalized    DJD neck and spine   RBBB    RBBB (right bundle branch block)    Past Surgical History:  Procedure Laterality Date   ABDOMINAL ADHESION SURGERY     APPENDECTOMY     DIRECT LARYNGOSCOPY Left 04/19/2016   Procedure: DIRECT MICROSCOPIC LARYNGOSCOPY WITH BIOPSY;  Surgeon: Deward Argue, MD;  Location: [facility];  Service: ENT;  Laterality: Left;   EP IMPLANTABLE DEVICE     Reveal device Medtronic Serial #[id number]  Model #[id number]   INGUINAL HERNIA REPAIR Left    KNEE SURGERY     PROSTATECTOMY     SHOULDER ARTHROSCOPY     TREATMENT FISTULA ANAL      Allergies  Allergen Reactions   Amiodarone Other (See Comments)    Trouble waking/falls   Multaq [Dronedarone] Other (See Comments)    unknown   Oxycodone     Albuterol Palpitations    Outpatient Encounter Medications as of 02/10/2024  Medication Sig   acetaminophen  (TYLENOL ) 500 MG tablet Take 1,000 mg by mouth daily as needed for fever or mild pain.   amoxicillin (AMOXIL) 500 MG capsule Take 2,000 mg by mouth daily as needed. Give  1 hour prior to dental procedure   apixaban  (ELIQUIS ) 5 MG TABS tablet Take 1 tablet (5 mg total) by mouth 2 (two) times daily.   benzocaine (ORAJEL) 10 % mucosal gel Use as directed 1 Application in the mouth or throat every 6 (six) hours as needed for mouth pain.   bisacodyl (DULCOLAX) 10 MG suppository Place 10 mg rectally daily as needed for moderate constipation.   diazepam  (VALIUM ) 5 MG tablet Take 0.5 tablets (2.5 mg total) by mouth every 4 (four) hours as  needed for anxiety.   diazepam  (VALIUM ) 5 MG tablet Take 1 tablet (5 mg total) by mouth daily in the afternoon.   diclofenac  Sodium (VOLTAREN ) 1 % GEL Apply 2 g topically 4 (four) times daily as needed (NECK PAIN).   donepezil  (ARICEPT ) 5 MG tablet Take 5 mg by mouth at bedtime.   DULoxetine  (CYMBALTA ) 30 MG capsule Take 1 capsule (30 mg total) by mouth daily.   Emollient (CERAVE DAILY MOISTURIZING) LOTN Apply topically 2 (two) times daily.   guaiFENesin (ROBITUSSIN) 100 MG/5ML liquid Take 10 mLs by mouth every 4 (four) hours as needed for cough or to loosen phlegm.   HYDROcodone -acetaminophen  (NORCO/VICODIN) 5-325 MG tablet Take 1 tablet by mouth every 8 (eight) hours as needed for moderate pain (pain score 4-6).   melatonin 3 MG TABS tablet Take 3 mg by mouth at bedtime.   memantine  (NAMENDA ) 10 MG tablet Take 10 mg by mouth 2 (two) times daily.   Multiple Vitamins-Minerals (THERAGRAN-M PO) Take 1 tablet by mouth daily.   nystatin (MYCOSTATIN/NYSTOP) powder Apply 1 Application topically every 12 (twelve) hours as needed.   omeprazole (PRILOSEC) 20 MG capsule Take 20 mg by mouth at bedtime.   polyethylene glycol (MIRALAX  / GLYCOLAX ) 17 g packet Take 17 g by mouth every other day. Scheduled and 1 scoop as needed for constipation daily in liquid of choice   senna-docusate (SENOKOT-S) 8.6-50 MG tablet Take 2 tablets by mouth at bedtime.   SUNSCREENS EX Apply 1 application  topically as needed (Apply to exposed areas topically as needed for prophylaxis during sun exposure).   Zinc  Oxide (TRIPLE PASTE SP EX) Apply 1 application  topically as needed (Apply to groin, buttocks topically as needed for redness or irritation every shift).   No facility-administered encounter medications on file as of 02/10/2024.    Review of Systems  Immunization History  Administered Date(s) Administered   Influenza-Unspecified 05/02/2015, 04/09/2019   Moderna Sars-Covid-2 Vaccination 07/12/2019, 05/11/2020    Pneumococcal Conjugate-13 01/31/2019   Td 04/20/2002   Tdap 12/08/2008   Zoster, Live 11/12/2011   Pertinent  Health Maintenance Due  Topic Date Due   INFLUENZA VACCINE  01/31/2024      06/21/2022    8:00 AM 06/21/2022   10:00 PM 06/22/2022   11:00 AM 11/01/2022    8:42 AM 11/07/2023   10:20 AM  Fall Risk  Falls in the past year?    1 1  Was there an injury with Fall?    0 0  Fall Risk Category Calculator    2 2  (RETIRED) Patient Fall Risk Level High fall risk  High fall risk  High fall risk     Patient at Risk for Falls Due to    Impaired mobility;History of fall(s);Impaired balance/gait History of fall(s)  Fall risk Follow up    Falls prevention discussed;Education provided Falls evaluation completed     Data saved with a previous flowsheet row definition   Functional  Status Survey:    There were no vitals filed for this visit. There is no height or weight on file to calculate BMI. Physical Exam Cardiovascular:     Rate and Rhythm: Normal rate.     Pulses: Normal pulses.  Pulmonary:     Effort: Pulmonary effort is normal.  Neurological:     Mental Status: He is alert. Mental status is at baseline. He is disoriented.     Labs reviewed: Recent Labs    05/06/23 0000 11/11/23 0000  NA 140 142  K 3.8 3.9  CL 105 105  CO2 28* 32*  BUN 22* 23*  CREATININE 0.9 0.9  CALCIUM 9.5 9.4   Recent Labs    05/06/23 0000 11/11/23 0000  AST 17 15  ALT 9* 10  ALKPHOS 47 56  ALBUMIN 4.2 4.0   Recent Labs    05/06/23 0000 11/11/23 0000  WBC 5.4 5.7  NEUTROABS 2,727.00 2,588.00  HGB 14.1 13.8  HCT 42 42  PLT 134* 133*   No results found for: TSH No results found for: HGBA1C Lab Results  Component Value Date   CHOL 178 05/15/2016   HDL 53 05/15/2016   LDLCALC 104 (H) 05/15/2016   TRIG 106 05/15/2016   CHOLHDL 3.4 05/15/2016    Significant Diagnostic Results in last 30 days:  No results found.  Assessment/Plan Alzheimer's disease Significant decline  in the last month with increased pain, decreased food intake, and a six-pound weight loss. Alert and oriented to self.  Atrial fibrillation No specific discussion of current management or symptoms.  Osteoarthritis No specific change of current management or symptoms.  Chronic gout No specific change of current management or symptoms.  Essential (primary) hypertension No specific change of current management or symptoms.  Chronic pain Managed with Norco. Increased pain noted in the last month. Unclear underlying cause. He has had periodic fevers as well. Terrible back pain limiting mobility. Family previously deferred imaging based on cognitive state and current physical function.   Anxiety disorder Managed with Valium .  Unintentional weight loss Six-pound weight loss over the last month, associated with decreased food intake and increased pain. Concern this is due to pain and poor mobility. Will continue to monitor weights. If persistent, could be an aspect of further decline. Continue GOC conversations. Consider protein supplementation   Family/ staff Communication: nursing  Labs/tests ordered:  none

## 2024-02-17 ENCOUNTER — Encounter: Payer: Self-pay | Admitting: Student

## 2024-02-17 ENCOUNTER — Non-Acute Institutional Stay: Payer: Self-pay | Admitting: Student

## 2024-02-17 DIAGNOSIS — R509 Fever, unspecified: Secondary | ICD-10-CM

## 2024-02-17 DIAGNOSIS — G309 Alzheimer's disease, unspecified: Secondary | ICD-10-CM | POA: Diagnosis not present

## 2024-02-17 DIAGNOSIS — F02818 Dementia in other diseases classified elsewhere, unspecified severity, with other behavioral disturbance: Secondary | ICD-10-CM | POA: Diagnosis not present

## 2024-02-17 NOTE — Progress Notes (Signed)
 Location: [facility] Room Number: [facility] 103-P Place of Service:  ALF [phone]) Provider:  Abdul Fine, MD   Abdul Fine, MD  Patient Care Team: Abdul Fine, MD as PCP - General [facility] Medicine)  Extended Emergency Contact Information Primary Emergency Contact: [name],[name] Address: [address]          [place], [place] [address] [place] Home Phone: [phone] Mobile Phone: [phone] Relation: Daughter Secondary Emergency Contact: Therman Pao  [place] Mobile Phone: [phone] Relation: Daughter  Code Status:  DNR Goals of care: Advanced Directive information    02/17/2024    9:30 AM  Advanced Directives  Does Patient Have a Medical Advance Directive? Yes  Type of Estate agent of [place];Out of facility DNR (pink MOST or yellow form)  Does patient want to make changes to medical advance directive? No - Patient declined     Chief Complaint  Patient presents with   Acute Visit    Fever of unknown orgin    HPI:  Pt is a 89 y.o. male seen today for an acute visit for Fevers: History of Present Illness He is a 89 year old with Alzheimer's who presents with intermittent fevers and severe back pain.  He has been experiencing intermittent fevers for the last six weeks, with temperatures reaching up to 103F. Despite multiple diagnostic tests, including urinalysis, blood cultures, and a respiratory viral panel, the cause of the fevers remains unclear. Additional tests, including ESR, CRP, CEA 19, LDH, and TNF alpha, have been ordered but are still pending. There is a low likelihood of tick exposure as he resides in a memory care facility and has not been outside during the summer months for years.  He experiences severe back pain that is only controlled with Norco. The pain has been persistent, and there is no mention of radiation or specific location beyond the back.  A work-up  for potential causes of his symptoms has been extensive. A urinalysis and culture were negative, and a bilateral lower extremity venous Doppler was negative for DVT. An elevated D-dimer was noted. A respiratory viral panel was negative for multiple viruses, including COVID-19, flu, and others. Blood cultures were also negative. His white blood count was initially elevated but has since returned to normal. A right upper quadrant ultrasound was negative for gallstones or inflammation.  He has experienced a weight loss of six pounds over the last month. He does not report any new skin breakdown or rashes, except for a scab on the right great toe. Social History - Living Situation: Lives in a memory care facility.   Past Medical History:  Diagnosis Date   Alzheimer's dementia (HCC)    ALZHEIMERS   Atrial fibrillation (HCC)    REVEAL DEVICE MEDTRONIC SERIAL #[id number] S MODEL #[id number]   Coronary atherosclerosis of native coronary artery    MI 2014   GERD (gastroesophageal reflux disease)    REFLUX   Gout    History of prostate cancer    HTN (hypertension)    LPRD (laryngopharyngeal reflux disease)    Osteoarthritis, generalized    DJD neck and spine   RBBB    RBBB (right bundle branch block)    Past Surgical History:  Procedure Laterality Date   ABDOMINAL ADHESION SURGERY     APPENDECTOMY     DIRECT LARYNGOSCOPY Left 04/19/2016   Procedure: DIRECT MICROSCOPIC LARYNGOSCOPY WITH BIOPSY;  Surgeon: Deward Argue, MD;  Location: [facility];  Service: ENT;  Laterality: Left;   EP IMPLANTABLE DEVICE     Reveal device Medtronic Serial #[id number]  Model [id number]   INGUINAL HERNIA REPAIR Left    KNEE SURGERY     PROSTATECTOMY     SHOULDER ARTHROSCOPY     TREATMENT FISTULA ANAL      Allergies  Allergen Reactions   Amiodarone Other (See Comments)    Trouble waking/falls   Multaq [Dronedarone] Other (See Comments)    unknown   Oxycodone     Albuterol Palpitations    Allergies as  of 02/17/2024       Reactions   Amiodarone Other (See Comments)   Trouble waking/falls   Multaq [dronedarone] Other (See Comments)   unknown   Oxycodone     Albuterol Palpitations        Medication List        Accurate as of February 17, 2024  9:33 AM. If you have any questions, ask your nurse or doctor.          acetaminophen  500 MG tablet Commonly known as: TYLENOL  Take 1,000 mg by mouth daily as needed for fever or mild pain.   amoxicillin 500 MG capsule Commonly known as: AMOXIL Take 2,000 mg by mouth daily as needed. Give 1 hour prior to dental procedure   apixaban  5 MG Tabs tablet Commonly known as: ELIQUIS  Take 1 tablet (5 mg total) by mouth 2 (two) times daily.   benzocaine 10 % mucosal gel Commonly known as: ORAJEL Use as directed 1 Application in the mouth or throat every 6 (six) hours as needed for mouth pain.   bisacodyl 10 MG suppository Commonly known as: DULCOLAX Place 10 mg rectally daily as needed for moderate constipation.   CeraVe Daily Moisturizing Lotn Apply topically 2 (two) times daily.   diazepam  5 MG tablet Commonly known as: VALIUM  Take 0.5 tablets (2.5 mg total) by mouth every 4 (four) hours as needed for anxiety.   diazepam  5 MG tablet Commonly known as: VALIUM  Take 1 tablet (5 mg total) by mouth daily in the afternoon.   diclofenac  Sodium 1 % Gel Commonly known as: VOLTAREN  Apply 2 g topically 4 (four) times daily as needed (NECK PAIN).   donepezil  5 MG tablet Commonly known as: ARICEPT  Take 5 mg by mouth at bedtime.   DULoxetine  30 MG capsule Commonly known as: Cymbalta  Take 1 capsule (30 mg total) by mouth daily.   guaiFENesin 100 MG/5ML liquid Commonly known as: ROBITUSSIN Take 10 mLs by mouth every 4 (four) hours as needed for cough or to loosen phlegm.   HYDROcodone -acetaminophen  5-325 MG tablet Commonly known as: NORCO/VICODIN Take 1 tablet by mouth every 8 (eight) hours as needed for moderate pain (pain score  4-6).   melatonin 3 MG Tabs tablet Take 3 mg by mouth at bedtime.   memantine  10 MG tablet Commonly known as: NAMENDA  Take 10 mg by mouth 2 (two) times daily.   nystatin powder Commonly known as: MYCOSTATIN/NYSTOP Apply 1 Application topically every 12 (twelve) hours as needed.   omeprazole 20 MG capsule Commonly known as: PRILOSEC Take 20 mg by mouth at bedtime.   polyethylene glycol 17 g packet Commonly known as: MIRALAX  / GLYCOLAX  Take 17 g by mouth every other day. Scheduled and 1 scoop as needed for constipation daily in liquid of choice   senna-docusate 8.6-50 MG tablet Commonly known as: Senokot-S Take 2 tablets by mouth at bedtime.   SUNSCREENS EX Apply 1 application  topically as needed (Apply to exposed  areas topically as needed for prophylaxis during sun exposure).   THERAGRAN-M PO Take 1 tablet by mouth daily.   TRIPLE PASTE SP EX Apply 1 application  topically as needed (Apply to groin, buttocks topically as needed for redness or irritation every shift).        Review of Systems  Immunization History  Administered Date(s) Administered   Influenza-Unspecified 05/02/2015, 04/09/2019   Moderna Sars-Covid-2 Vaccination 07/12/2019, 05/11/2020   Pneumococcal Conjugate-13 01/31/2019   Td 04/20/2002   Tdap 12/08/2008   Zoster, Live 11/12/2011   Pertinent  Health Maintenance Due  Topic Date Due   INFLUENZA VACCINE  01/31/2024      06/21/2022    8:00 AM 06/21/2022   10:00 PM 06/22/2022   11:00 AM 11/01/2022    8:42 AM 11/07/2023   10:20 AM  Fall Risk  Falls in the past year?    1 1  Was there an injury with Fall?    0 0  Fall Risk Category Calculator    2 2  (RETIRED) Patient Fall Risk Level High fall risk  High fall risk  High fall risk     Patient at Risk for Falls Due to    Impaired mobility;History of fall(s);Impaired balance/gait History of fall(s)  Fall risk Follow up    Falls prevention discussed;Education provided Falls evaluation completed      Data saved with a previous flowsheet row definition   Functional Status Survey:    Vitals:   02/17/24 0916  BP: (!) 143/83  Pulse: (!) 101  Resp: (!) 26  Temp: 98.2 F (36.8 C)  SpO2: 96%  Weight: 150 lb 8 oz (68.3 kg)  Height: 5' 4 (1.626 m)   Body mass index is 25.83 kg/m. Physical Exam GENERAL: Patient is lying in bed, humming, smiling, at cognitive baseline. SKIN: Skin without breakdown or rashes. Scab on right great toe. Labs reviewed: Recent Labs    05/06/23 0000 11/11/23 0000  NA 140 142  K 3.8 3.9  CL 105 105  CO2 28* 32*  BUN 22* 23*  CREATININE 0.9 0.9  CALCIUM 9.5 9.4   Recent Labs    05/06/23 0000 11/11/23 0000  AST 17 15  ALT 9* 10  ALKPHOS 47 56  ALBUMIN 4.2 4.0   Recent Labs    05/06/23 0000 11/11/23 0000  WBC 5.4 5.7  NEUTROABS 2,727.00 2,588.00  HGB 14.1 13.8  HCT 42 42  PLT 134* 133*   No results found for: TSH No results found for: HGBA1C Lab Results  Component Value Date   CHOL 178 05/15/2016   HDL 53 05/15/2016   LDLCALC 104 (H) 05/15/2016   TRIG 106 05/15/2016   CHOLHDL 3.4 05/15/2016    Significant Diagnostic Results in last 30 days:  Results LABS D-dimer: elevated Respiratory viral panel: negative for COVID, flu, adenovirus, RSV, parainfluenza, metapneumovirus, chlamydia, mycoplasma, human bocavirus WBC: initially elevated, decreased to normal  RADIOLOGY Venous Doppler: negative for DVT Right upper quadrant ultrasound: negative for gallstones or inflammation  Assessment/Plan Fever of unknown origin with unintentional weight loss and severe back pain Intermittent fevers for six weeks with peaks of 103F, severe back pain managed with Norco, and a six-pound weight loss in the last month. Negative urinalysis, culture, and respiratory viral panel. Elevated D-dimer with negative bilateral lower extremity venous Doppler for DVT. Blood cultures negative, initial elevated white blood count normalized. High suspicion  for meningitis given waxing and waning fevers. Low likelihood of tick exposure. Right upper quadrant  ultrasound negative for gallstones or inflammation. ESR, CRP, CEA 19, LDH, and TNF alpha pending to differentiate underlying cause. - Consult hospice for comfort measures as per requested by family.   Alzheimer's disease At cognitive baseline, smiling, and humming while lying in bed.  Right great toe callus with scab Scab on the right great toe with an associated callus. No signs of skin breakdown or rashes elsewhere. - Arrange for foot care to prevent further skin breakdown - Provide intuitive mobility support  Family/ staff Communication: nursing  Labs/tests ordered:   none  I spent greater than 30 minutes for the care of this patient in face to face time, chart review, clinical documentation, patient education.

## 2024-02-18 DIAGNOSIS — M6281 Muscle weakness (generalized): Secondary | ICD-10-CM | POA: Diagnosis not present

## 2024-02-18 DIAGNOSIS — R293 Abnormal posture: Secondary | ICD-10-CM | POA: Diagnosis not present

## 2024-02-18 DIAGNOSIS — G309 Alzheimer's disease, unspecified: Secondary | ICD-10-CM | POA: Diagnosis not present

## 2024-02-18 DIAGNOSIS — F0280 Dementia in other diseases classified elsewhere without behavioral disturbance: Secondary | ICD-10-CM | POA: Diagnosis not present

## 2024-02-20 ENCOUNTER — Other Ambulatory Visit: Payer: Self-pay | Admitting: Student

## 2024-02-20 DIAGNOSIS — M545 Low back pain, unspecified: Secondary | ICD-10-CM

## 2024-02-20 DIAGNOSIS — Z515 Encounter for palliative care: Secondary | ICD-10-CM

## 2024-02-20 MED ORDER — MORPHINE SULFATE (CONCENTRATE) 20 MG/ML PO SOLN: 5.0000 mg | ORAL | 0 refills | Status: DC | PRN

## 2024-02-20 MED ORDER — HYDROCODONE-ACETAMINOPHEN 5-325 MG PO TABS: ORAL_TABLET | ORAL | 0 refills | Status: DC

## 2024-02-20 NOTE — Progress Notes (Signed)
 Transition to Hospice.

## 2024-02-24 ENCOUNTER — Other Ambulatory Visit: Payer: Self-pay | Admitting: Student

## 2024-02-24 DIAGNOSIS — M545 Low back pain, unspecified: Secondary | ICD-10-CM

## 2024-02-24 DIAGNOSIS — Z515 Encounter for palliative care: Secondary | ICD-10-CM

## 2024-02-24 MED ORDER — MORPHINE SULFATE (CONCENTRATE) 20 MG/ML PO SOLN
ORAL | 0 refills | Status: DC
Start: 2024-02-24 — End: 2024-03-05

## 2024-02-24 NOTE — Progress Notes (Signed)
 End of life stages.

## 2024-02-26 ENCOUNTER — Encounter: Payer: Self-pay | Admitting: Student

## 2024-02-26 ENCOUNTER — Non-Acute Institutional Stay: Payer: Self-pay | Admitting: Student

## 2024-02-26 DIAGNOSIS — F02818 Dementia in other diseases classified elsewhere, unspecified severity, with other behavioral disturbance: Secondary | ICD-10-CM | POA: Diagnosis not present

## 2024-02-26 DIAGNOSIS — I2510 Atherosclerotic heart disease of native coronary artery without angina pectoris: Secondary | ICD-10-CM

## 2024-02-26 DIAGNOSIS — G309 Alzheimer's disease, unspecified: Secondary | ICD-10-CM | POA: Diagnosis not present

## 2024-02-26 DIAGNOSIS — Z515 Encounter for palliative care: Secondary | ICD-10-CM | POA: Diagnosis not present

## 2024-02-26 NOTE — Progress Notes (Unsigned)
 Location:  Other [facility].  Nursing Home Room Number: [facility] [id number] Place of Service:  ALF [phone]) Provider:  Abdul Fine, MD  Patient Care Team: Abdul Fine, MD as PCP - General [facility] Medicine)  Extended Emergency Contact Information Primary Emergency Contact: [name],[name] Address: [address]          [place], [place] [address] [place] Home Phone: [phone] Mobile Phone: [phone] Relation: Daughter Secondary Emergency Contact: Therman Pao  [place] Mobile Phone: [phone] Relation: Daughter  Code Status:  DNR Goals of care: Advanced Directive information    02/17/2024    9:30 AM  Advanced Directives  Does Patient Have a Medical Advance Directive? Yes  Type of Estate agent of [place];Out of facility DNR (pink MOST or yellow form)  Does patient want to make changes to medical advance directive? No - Patient declined     Chief Complaint  Patient presents with   End of Life Care    End of Life Care.     HPI:  Pt is a 89 y.o. male seen today for End of Life Care.  Patient appears terminal. Daughters sonya and karen at bedside.   Past Medical History:  Diagnosis Date   Alzheimer's dementia (HCC)    ALZHEIMERS   Atrial fibrillation (HCC)    REVEAL DEVICE MEDTRONIC SERIAL #[id number] S MODEL #[id number]   Coronary atherosclerosis of native coronary artery    MI 2014   GERD (gastroesophageal reflux disease)    REFLUX   Gout    History of prostate cancer    HTN (hypertension)    LPRD (laryngopharyngeal reflux disease)    Osteoarthritis, generalized    DJD neck and spine   RBBB    RBBB (right bundle branch block)    Past Surgical History:  Procedure Laterality Date   ABDOMINAL ADHESION SURGERY     APPENDECTOMY     DIRECT LARYNGOSCOPY Left 04/19/2016   Procedure: DIRECT MICROSCOPIC LARYNGOSCOPY WITH BIOPSY;  Surgeon: Deward Argue, MD;  Location: [facility];  Service: ENT;   Laterality: Left;   EP IMPLANTABLE DEVICE     Reveal device Medtronic Serial #[id number]  Model #[id number]   INGUINAL HERNIA REPAIR Left    KNEE SURGERY     PROSTATECTOMY     SHOULDER ARTHROSCOPY     TREATMENT FISTULA ANAL      Allergies  Allergen Reactions   Amiodarone Other (See Comments)    Trouble waking/falls   Multaq [Dronedarone] Other (See Comments)    unknown   Oxycodone     Albuterol Palpitations    Outpatient Encounter Medications as of 02/26/2024  Medication Sig   acetaminophen  (TYLENOL ) 650 MG suppository Place 650 mg rectally every 4 (four) hours as needed.   amoxicillin (AMOXIL) 500 MG capsule Take 2,000 mg by mouth daily as needed. Give 1 hour prior to dental procedure   atropine 1 % ophthalmic solution 2 drops every 4 (four) hours as needed.   benzocaine (ORAJEL) 10 % mucosal gel Use as directed 1 Application in the mouth or throat every 6 (six) hours as needed for mouth pain.   bisacodyl (DULCOLAX) 10 MG suppository Place 10 mg rectally daily as needed for moderate constipation.   diazepam  (VALIUM ) 5 MG tablet Take 0.5 tablets (2.5 mg total) by mouth every 4 (four) hours as needed for anxiety.   diazepam  (VALIUM ) 5 MG tablet Take 1 tablet (5 mg total) by mouth daily in the afternoon.   diclofenac  Sodium (VOLTAREN ) 1 %  GEL Apply 2 g topically 4 (four) times daily as needed (NECK PAIN).   Emollient (CERAVE DAILY MOISTURIZING) LOTN Apply topically 2 (two) times daily.   melatonin 3 MG TABS tablet Take 3 mg by mouth at bedtime.   morphine  (ROXANOL) 20 MG/ML concentrated solution Take 0.25 mLs (5 mg total) by mouth every 4 (four) hours. May also take 0.25 mLs (5 mg total) every hour as needed for severe pain (pain score 7-10), breakthrough pain, anxiety or shortness of breath.   nystatin (MYCOSTATIN/NYSTOP) powder Apply 1 Application topically every 12 (twelve) hours as needed.   omeprazole (PRILOSEC) 20 MG capsule Take 20 mg by mouth at bedtime.   polyethylene glycol  (MIRALAX  / GLYCOLAX ) 17 g packet Take 17 g by mouth every other day. Scheduled and 1 scoop as needed for constipation daily in liquid of choice   senna (SENOKOT) 8.6 MG TABS tablet Take 1 tablet by mouth at bedtime as needed for mild constipation.   senna-docusate (SENOKOT-S) 8.6-50 MG tablet Take 2 tablets by mouth at bedtime.   SUNSCREENS EX Apply 1 application  topically as needed (Apply to exposed areas topically as needed for prophylaxis during sun exposure).   Zinc  Oxide (TRIPLE PASTE SP EX) Apply 1 application  topically as needed (Apply to groin, buttocks topically as needed for redness or irritation every shift).   acetaminophen  (TYLENOL ) 500 MG tablet Take 1,000 mg by mouth daily as needed for fever or mild pain. (Patient not taking: Reported on 02/26/2024)   apixaban  (ELIQUIS ) 5 MG TABS tablet Take 1 tablet (5 mg total) by mouth 2 (two) times daily. (Patient not taking: Reported on 02/26/2024)   guaiFENesin (ROBITUSSIN) 100 MG/5ML liquid Take 10 mLs by mouth every 4 (four) hours as needed for cough or to loosen phlegm. (Patient not taking: Reported on 02/26/2024)   No facility-administered encounter medications on file as of 02/26/2024.    Review of Systems  Immunization History  Administered Date(s) Administered   Influenza-Unspecified 05/02/2015, 04/09/2019   Moderna Sars-Covid-2 Vaccination 07/12/2019, 05/11/2020   Pneumococcal Conjugate-13 01/31/2019   Td 04/20/2002   Tdap 12/08/2008   Zoster, Live 11/12/2011   Pertinent  Health Maintenance Due  Topic Date Due   INFLUENZA VACCINE  01/31/2024      06/21/2022    8:00 AM 06/21/2022   10:00 PM 06/22/2022   11:00 AM 11/01/2022    8:42 AM 11/07/2023   10:20 AM  Fall Risk  Falls in the past year?    1 1  Was there an injury with Fall?    0 0  Fall Risk Category Calculator    2 2  (RETIRED) Patient Fall Risk Level High fall risk  High fall risk  High fall risk     Patient at Risk for Falls Due to    Impaired mobility;History of  fall(s);Impaired balance/gait History of fall(s)  Fall risk Follow up    Falls prevention discussed;Education provided Falls evaluation completed     Data saved with a previous flowsheet row definition   Functional Status Survey:    Vitals:   02/26/24 0821 02/26/24 0843  BP: (!) 141/80 (!) 143/83  Pulse: 97   Resp: (!) 22   Temp: 97.9 F (36.6 C)   SpO2: (!) 89%   Weight: 150 lb 8 oz (68.3 kg)   Height: 5' 4 (1.626 m)    Body mass index is 25.83 kg/m. Physical Exam Constitutional:      Comments: sleeping  Pulmonary:  Comments: Nl WOB, increased RR Skin:    Comments: Cold, dry skin. Mottling     Labs reviewed: Recent Labs    11/11/23 0000 01/14/24 0000 02/10/24 0000  NA 142 141 141  K 3.9 3.9 4.6  CL 105 104 101  CO2 32* 27* 28*  BUN 23* 21 23*  CREATININE 0.9 0.9 0.8  CALCIUM 9.4 9.7 9.6   Recent Labs    05/06/23 0000 11/11/23 0000  AST 17 15  ALT 9* 10  ALKPHOS 47 56  ALBUMIN 4.2 4.0   Recent Labs    11/11/23 0000 01/14/24 0000 02/10/24 0000  WBC 5.7 12.5 17.0  NEUTROABS 2,588.00 9,788.00 12,104.00  HGB 13.8 14.0 15.7  HCT 42 42 49  PLT 133* 163 227   No results found for: TSH No results found for: HGBA1C Lab Results  Component Value Date   CHOL 178 05/15/2016   HDL 53 05/15/2016   LDLCALC 104 (H) 05/15/2016   TRIG 106 05/15/2016   CHOLHDL 3.4 05/15/2016    Significant Diagnostic Results in last 30 days:  No results found.  Assessment/Plan Atherosclerosis of native coronary artery of native heart without angina pectoris  End of life care  Alzheimer's dementia with behavioral disturbance (HCC) Patient without PO intake for 72 hours. Skin and breathing changes consistent with end of life. Continue scheduled morphine  and q4 hour morphine . Goal of RR 16/min at this time 20/min. Will provide comfort medications as indicated.  Family/ staff Communication: nursing, daughters.  Labs/tests ordered:  none

## 2024-03-02 DEATH — deceased
